# Patient Record
Sex: Male | Born: 1945 | Race: White | Hispanic: No | Marital: Married | Smoking: Never smoker
Health system: Southern US, Community
[De-identification: ages and names within clinical notes are randomized; demographics above are authoritative.]

## PROBLEM LIST (undated history)

## (undated) DIAGNOSIS — J309 Allergic rhinitis, unspecified: Secondary | ICD-10-CM

## (undated) DIAGNOSIS — D72819 Decreased white blood cell count, unspecified: Secondary | ICD-10-CM

## (undated) DIAGNOSIS — C4490 Unspecified malignant neoplasm of skin, unspecified: Secondary | ICD-10-CM

## (undated) DIAGNOSIS — M87051 Idiopathic aseptic necrosis of right femur: Secondary | ICD-10-CM

## (undated) DIAGNOSIS — K4020 Bilateral inguinal hernia, without obstruction or gangrene, not specified as recurrent: Secondary | ICD-10-CM

## (undated) DIAGNOSIS — G4730 Sleep apnea, unspecified: Secondary | ICD-10-CM

## (undated) DIAGNOSIS — E785 Hyperlipidemia, unspecified: Secondary | ICD-10-CM

## (undated) DIAGNOSIS — E538 Deficiency of other specified B group vitamins: Secondary | ICD-10-CM

## (undated) DIAGNOSIS — F1910 Other psychoactive substance abuse, uncomplicated: Secondary | ICD-10-CM

## (undated) DIAGNOSIS — K2270 Barrett's esophagus without dysplasia: Secondary | ICD-10-CM

## (undated) DIAGNOSIS — C801 Malignant (primary) neoplasm, unspecified: Secondary | ICD-10-CM

## (undated) DIAGNOSIS — I2510 Atherosclerotic heart disease of native coronary artery without angina pectoris: Secondary | ICD-10-CM

## (undated) DIAGNOSIS — N281 Cyst of kidney, acquired: Secondary | ICD-10-CM

## (undated) DIAGNOSIS — D649 Anemia, unspecified: Secondary | ICD-10-CM

## (undated) DIAGNOSIS — N400 Enlarged prostate without lower urinary tract symptoms: Secondary | ICD-10-CM

## (undated) DIAGNOSIS — G4733 Obstructive sleep apnea (adult) (pediatric): Secondary | ICD-10-CM

## (undated) DIAGNOSIS — I447 Left bundle-branch block, unspecified: Secondary | ICD-10-CM

## (undated) DIAGNOSIS — M1990 Unspecified osteoarthritis, unspecified site: Secondary | ICD-10-CM

## (undated) DIAGNOSIS — M87052 Idiopathic aseptic necrosis of left femur: Secondary | ICD-10-CM

## (undated) DIAGNOSIS — M51369 Other intervertebral disc degeneration, lumbar region without mention of lumbar back pain or lower extremity pain: Secondary | ICD-10-CM

## (undated) DIAGNOSIS — N411 Chronic prostatitis: Secondary | ICD-10-CM

## (undated) DIAGNOSIS — I10 Essential (primary) hypertension: Secondary | ICD-10-CM

## (undated) DIAGNOSIS — G629 Polyneuropathy, unspecified: Secondary | ICD-10-CM

## (undated) DIAGNOSIS — N529 Male erectile dysfunction, unspecified: Secondary | ICD-10-CM

## (undated) DIAGNOSIS — E559 Vitamin D deficiency, unspecified: Secondary | ICD-10-CM

## (undated) DIAGNOSIS — K219 Gastro-esophageal reflux disease without esophagitis: Secondary | ICD-10-CM

## (undated) DIAGNOSIS — D126 Benign neoplasm of colon, unspecified: Secondary | ICD-10-CM

## (undated) DIAGNOSIS — Z7982 Long term (current) use of aspirin: Secondary | ICD-10-CM

## (undated) DIAGNOSIS — R7303 Prediabetes: Secondary | ICD-10-CM

## (undated) HISTORY — DX: Anemia, unspecified: D64.9

## (undated) HISTORY — PX: HEMORRHOID SURGERY: SHX153

## (undated) HISTORY — PX: OTHER SURGICAL HISTORY: SHX169

## (undated) HISTORY — PX: APPENDECTOMY: SHX54

## (undated) HISTORY — PX: COLONOSCOPY W/ POLYPECTOMY: SHX1380

## (undated) HISTORY — PX: TONSILLECTOMY: SUR1361

## (undated) HISTORY — PX: ESOPHAGOGASTRODUODENOSCOPY: SHX1529

---

## 2005-05-05 ENCOUNTER — Ambulatory Visit: Payer: Self-pay | Admitting: Unknown Physician Specialty

## 2008-08-21 ENCOUNTER — Ambulatory Visit: Payer: Self-pay | Admitting: Unknown Physician Specialty

## 2008-10-23 ENCOUNTER — Ambulatory Visit: Payer: Self-pay | Admitting: Urology

## 2012-03-14 ENCOUNTER — Ambulatory Visit: Payer: Self-pay | Admitting: Unknown Physician Specialty

## 2013-12-05 ENCOUNTER — Ambulatory Visit: Payer: Self-pay | Admitting: Unknown Physician Specialty

## 2013-12-08 LAB — PATHOLOGY REPORT

## 2013-12-15 ENCOUNTER — Ambulatory Visit: Payer: Self-pay | Admitting: Unknown Physician Specialty

## 2014-01-08 DIAGNOSIS — N401 Enlarged prostate with lower urinary tract symptoms: Secondary | ICD-10-CM

## 2014-01-08 DIAGNOSIS — E291 Testicular hypofunction: Secondary | ICD-10-CM | POA: Insufficient documentation

## 2014-01-08 DIAGNOSIS — F528 Other sexual dysfunction not due to a substance or known physiological condition: Secondary | ICD-10-CM | POA: Insufficient documentation

## 2014-01-08 DIAGNOSIS — N368 Other specified disorders of urethra: Secondary | ICD-10-CM | POA: Insufficient documentation

## 2014-01-08 DIAGNOSIS — N529 Male erectile dysfunction, unspecified: Secondary | ICD-10-CM | POA: Insufficient documentation

## 2014-01-08 DIAGNOSIS — N411 Chronic prostatitis: Secondary | ICD-10-CM | POA: Insufficient documentation

## 2014-01-08 DIAGNOSIS — N138 Other obstructive and reflux uropathy: Secondary | ICD-10-CM | POA: Insufficient documentation

## 2014-01-08 DIAGNOSIS — N400 Enlarged prostate without lower urinary tract symptoms: Secondary | ICD-10-CM | POA: Insufficient documentation

## 2014-03-17 ENCOUNTER — Ambulatory Visit: Payer: Self-pay | Admitting: Anesthesiology

## 2014-03-19 DIAGNOSIS — I447 Left bundle-branch block, unspecified: Secondary | ICD-10-CM | POA: Insufficient documentation

## 2014-05-12 ENCOUNTER — Ambulatory Visit: Payer: Self-pay | Admitting: Orthopedic Surgery

## 2014-06-16 DIAGNOSIS — E7800 Pure hypercholesterolemia, unspecified: Secondary | ICD-10-CM | POA: Insufficient documentation

## 2014-06-16 DIAGNOSIS — I10 Essential (primary) hypertension: Secondary | ICD-10-CM | POA: Insufficient documentation

## 2014-10-29 DIAGNOSIS — G629 Polyneuropathy, unspecified: Secondary | ICD-10-CM | POA: Insufficient documentation

## 2014-11-21 NOTE — Op Note (Signed)
PATIENT NAME:  Harry Patterson, Harry Patterson MR#:  [id number] DATE OF BIRTH:  02-11-1946  DATE OF PROCEDURE:  05/12/2014  PREOPERATIVE DIAGNOSIS: Right thumb mucous cyst with dorsal spur distal phalanx.   POSTOPERATIVE DIAGNOSIS:  Right thumb mucous cyst with dorsal spur distal phalanx.   PROCEDURE: Excision of dorsal spur distal phalanx, right thumb.   ANESTHESIA: General.   SURGEON: Hessie Knows, MD.   DESCRIPTION OF PROCEDURE: The patient was brought to the operating room and after adequate anesthesia was obtained the right arm was prepped and draped in the usual sterile fashion with a tourniquet applied to the upper arm. After patient identification and timeout procedures were completed, the tourniquet was raised to 250 mmHg. A curved incision was made over the IP joint of the thumb dorsally to give adequate exposure to the extensor tendon. This was lifted up as a flap and a hole in the tendon was identified consistent with the area of the mucous cyst which had ruptured prior to surgery. This hole was debrided. An incision was made on the radial side of the extensor tendon lifting the tendon up and protecting it. There was a large dorsal spur off the proximal end of the distal phalanx. A tiny osteotome was used and followed by a curette to remove this until it was no longer palpable through the dorsal skin and through the dorsum of the thumb.  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 After adequate resection of the spur based on direct visualization and palpation the wound was thoroughly irrigated and the wound was closed using simple interrupted 4-0 nylon skin sutures. Xeroform, 2 x 2s and a finger roll were applied. 10 mL of 0.5% Sensorcaine without epinephrine were infiltrated proximal to the incision to aid in postoperative analgesia.   ESTIMATED BLOOD LOSS: Minimal.   COMPLICATIONS: None.    SPECIMEN: None.   TOURNIQUET TIME: 15 minutes at 250 mmHg.    ____________________________ Harry Footman, MD mjm:bu D: 05/12/2014 19:14:41 ET T: 05/12/2014 20:29:35 ET JOB#: [id number]  cc: Harry Footman, MD, <Dictator> Harry Footman MD ELECTRONICALLY SIGNED 05/13/2014 0:29

## 2015-01-26 ENCOUNTER — Ambulatory Visit: Payer: BLUE CROSS/BLUE SHIELD | Attending: Otolaryngology

## 2015-01-26 DIAGNOSIS — G4733 Obstructive sleep apnea (adult) (pediatric): Secondary | ICD-10-CM | POA: Diagnosis not present

## 2015-06-14 DIAGNOSIS — Z85828 Personal history of other malignant neoplasm of skin: Secondary | ICD-10-CM | POA: Insufficient documentation

## 2015-10-27 ENCOUNTER — Other Ambulatory Visit: Payer: Self-pay | Admitting: Neurology

## 2015-10-27 DIAGNOSIS — R200 Anesthesia of skin: Secondary | ICD-10-CM

## 2015-10-27 DIAGNOSIS — M545 Low back pain: Secondary | ICD-10-CM

## 2015-10-28 ENCOUNTER — Other Ambulatory Visit: Payer: Self-pay | Admitting: Neurology

## 2015-10-28 DIAGNOSIS — R51 Headache: Principal | ICD-10-CM

## 2015-10-28 DIAGNOSIS — R519 Headache, unspecified: Secondary | ICD-10-CM

## 2015-10-28 DIAGNOSIS — M545 Low back pain: Secondary | ICD-10-CM

## 2015-11-04 ENCOUNTER — Ambulatory Visit
Admission: RE | Admit: 2015-11-04 | Discharge: 2015-11-04 | Disposition: A | Discharge status: Home / Self Care | Payer: BLUE CROSS/BLUE SHIELD | Source: Ambulatory Visit | Attending: Neurology | Admitting: Neurology

## 2015-11-04 DIAGNOSIS — M545 Low back pain: Secondary | ICD-10-CM

## 2015-11-04 DIAGNOSIS — R51 Headache: Principal | ICD-10-CM

## 2015-11-04 DIAGNOSIS — R519 Headache, unspecified: Secondary | ICD-10-CM

## 2016-05-16 ENCOUNTER — Other Ambulatory Visit: Payer: Self-pay | Admitting: Internal Medicine

## 2016-05-16 DIAGNOSIS — M542 Cervicalgia: Secondary | ICD-10-CM

## 2016-05-29 ENCOUNTER — Ambulatory Visit: Payer: BLUE CROSS/BLUE SHIELD

## 2017-02-09 ENCOUNTER — Emergency Department
Admission: EM | Admit: 2017-02-09 | Discharge: 2017-02-09 | Disposition: A | Discharge status: Home / Self Care | Payer: BLUE CROSS/BLUE SHIELD | Attending: Emergency Medicine | Admitting: Emergency Medicine

## 2017-02-09 ENCOUNTER — Emergency Department: Payer: BLUE CROSS/BLUE SHIELD

## 2017-02-09 ENCOUNTER — Encounter: Payer: Self-pay | Admitting: Emergency Medicine

## 2017-02-09 DIAGNOSIS — Y92018 Other place in single-family (private) house as the place of occurrence of the external cause: Secondary | ICD-10-CM | POA: Diagnosis not present

## 2017-02-09 DIAGNOSIS — S299XXA Unspecified injury of thorax, initial encounter: Secondary | ICD-10-CM | POA: Diagnosis present

## 2017-02-09 DIAGNOSIS — W19XXXA Unspecified fall, initial encounter: Secondary | ICD-10-CM

## 2017-02-09 DIAGNOSIS — S2232XA Fracture of one rib, left side, initial encounter for closed fracture: Secondary | ICD-10-CM | POA: Diagnosis not present

## 2017-02-09 DIAGNOSIS — Y93H9 Activity, other involving exterior property and land maintenance, building and construction: Secondary | ICD-10-CM | POA: Diagnosis not present

## 2017-02-09 DIAGNOSIS — W1789XA Other fall from one level to another, initial encounter: Secondary | ICD-10-CM | POA: Insufficient documentation

## 2017-02-09 DIAGNOSIS — Y998 Other external cause status: Secondary | ICD-10-CM | POA: Diagnosis not present

## 2017-02-09 HISTORY — DX: Essential (primary) hypertension: I10

## 2017-02-09 HISTORY — DX: Malignant (primary) neoplasm, unspecified: C80.1

## 2017-02-09 HISTORY — DX: Benign prostatic hyperplasia without lower urinary tract symptoms: N40.0

## 2017-02-09 HISTORY — DX: Allergic rhinitis, unspecified: J30.9

## 2017-02-09 HISTORY — DX: Left bundle-branch block, unspecified: I44.7

## 2017-02-09 HISTORY — DX: Gastro-esophageal reflux disease without esophagitis: K21.9

## 2017-02-09 HISTORY — DX: Decreased white blood cell count, unspecified: D72.819

## 2017-02-09 MED ORDER — TRAMADOL HCL 50 MG PO TABS: 50.0000 mg | ORAL_TABLET | Freq: Four times a day (QID) | ORAL | 0 refills | Status: AC | PRN

## 2017-02-09 MED ORDER — DIAZEPAM 5 MG PO TABS
5.0000 mg | ORAL_TABLET | Freq: Once | ORAL | Status: AC
  Administered 2017-02-09: 5 mg via ORAL
  Filled 2017-02-09: qty 1

## 2017-02-09 MED ORDER — TRAMADOL HCL 50 MG PO TABS
50.0000 mg | ORAL_TABLET | Freq: Once | ORAL | Status: AC
  Administered 2017-02-09: 50 mg via ORAL
  Filled 2017-02-09: qty 1

## 2017-02-09 MED ORDER — DIAZEPAM 5 MG/ML IJ SOLN: 2.5000 mg | Freq: Once | INTRAMUSCULAR | Status: DC

## 2017-02-09 MED ORDER — CYCLOBENZAPRINE HCL 5 MG PO TABS: 5.0000 mg | ORAL_TABLET | Freq: Three times a day (TID) | ORAL | 0 refills | Status: DC | PRN

## 2017-02-09 NOTE — ED Notes (Signed)
Patient declined recheck of vital signs.

## 2017-02-09 NOTE — ED Triage Notes (Signed)
Pt presents with low back pain due to falling off of a four foot deck yesterday landing on a bush. NO LOC at time of fall only c/o low back pain worse when sitting.

## 2017-02-09 NOTE — ED Provider Notes (Signed)
[facility] Emergency Department Provider Note   ____________________________________________   I have reviewed the triage vital signs and the nursing notes.   HISTORY  Chief Complaint Fall and Back Pain    HPI Harry Patterson. is a 71 y.o. male presents emergency Department with left side mid back pain and left rib pain since falling yesterday. Patient described climbing a ladder to help a neighbor change a flood light on their home when he misstepped and fell off the ladder landing on a plastic lion. Patient describes falling on the left side of his back and flank region. Patient reports pain along the left side of his mid back along the back musculature that worsens with mobility, palpation and breathing. Patient reported immediate onset of pain and difficulty with mobility following the fall. Patient denies loss of consciousness, head/neck or spine pain. Patient denies any past history of back injury or surgeries. Patient denies hematuria, radicular symptoms, bowel or bladder dysfunction or saddle anesthesia.Patient denies fever, chills, headache, vision changes, chest pain, chest tightness, shortness of breath, abdominal pain, nausea and vomiting.  Past Medical History:  Diagnosis Date  . Allergic rhinitis   . Cancer ([place])    skin  . Enlarged prostate   . GERD (gastroesophageal reflux disease)   . Hypertension   . LBBB (left bundle branch block)   . Leukopenia     There are no active problems to display for this patient.   History reviewed. No pertinent surgical history.  Prior to Admission medications   Medication Sig Start Date End Date Taking? Authorizing Provider  cyclobenzaprine (FLEXERIL) 5 MG tablet Take 1 tablet (5 mg total) by mouth 3 (three) times daily as needed. 02/09/17   Jacyln Carmer M, PA-C  traMADol (ULTRAM) 50 MG tablet Take 1 tablet (50 mg total) by mouth every 6 (six) hours as needed. 02/09/17 02/09/18  Corwin Kuiken, Laroy Apple,  PA-C    Allergies Patient has no known allergies.  No family history on file.  Social History Social History  Substance Use Topics  . Smoking status: Never Smoker  . Smokeless tobacco: Never Used  . Alcohol use No    Review of Systems Constitutional: Negative for fever/chills Eyes: No visual changes. ENT:  Negative difficulty swallowing Cardiovascular: Denies chest pain. Respiratory: Denies shortness of breath. Gastrointestinal: No abdominal pain.  No nausea, vomiting, diarrhea. Musculoskeletal: Positive for left side mid back pain localized along the rib cage.  Skin: Negative for rash. Neurological: Negative for headaches.  Negative focal weakness or numbness. Negative for loss of consciousness. Able to ambulate. ____________________________________________   PHYSICAL EXAM:  VITAL SIGNS: ED Triage Vitals  Enc Vitals Group     BP 02/09/17 1130 (!) 154/86     Pulse Rate 02/09/17 1130 80     Resp 02/09/17 1130 20     Temp 02/09/17 1130 97.9 F (36.6 C)     Temp Source 02/09/17 1130 Oral     SpO2 02/09/17 1130 96 %     Weight 02/09/17 1131 180 lb (81.6 kg)     Height 02/09/17 1131 5\' 8"  (1.727 m)     Head Circumference --      Peak Flow --      Pain Score 02/09/17 1129 8     Pain Loc --      Pain Edu? --      Excl. in [place]? --     Constitutional: Alert and oriented. Well appearing and in no acute distress.  Head: Normocephalic and atraumatic. Eyes: Conjunctivae are normal. PERRL. Normal extraocular movements Nose: No congestion/rhinorrhea Mouth/Throat: Mucous membranes are moist. Cardiovascular: Normal rate, regular rhythm. Normal distal pulses. Respiratory: Normal respiratory effort. No wheezes/rales/rhonchi. Lungs CTAB Gastrointestinal: Soft and nontender. No distention. Musculoskeletal: Left side mid back pain localized along lower ribs that increases with breathing motions, trunk movement. No crepitus noted. Negative spinous process pain or deformities noted  along the spine. Palpable generalized tenderness along paraspinal musculature. Nontender with normal range of motion in all extremities. Neurologic: Normal speech and language. No gross focal neurologic deficits are appreciated. Skin:  Skin is warm, dry and intact. No rash noted. ____________________________________________   LABS (all labs ordered are listed, but only abnormal results are displayed)  Labs Reviewed - No data to display ____________________________________________  EKG none ____________________________________________  RADIOLOGY DG Ribs Unilateral w/chest left FINDINGS: Normal heart size, mediastinal contours, and pulmonary vascularity.  Mild bronchitic changes.  No acute infiltrate, pleural effusion or pneumothorax.  Minimal atelectasis at RIGHT costophrenic angle.  Osseous mineralization grossly normal.  BB placed at site of symptoms posterior lower LEFT ribs.  Questionable nondisplaced fracture of the posterior LEFT eleventh rib.  No additional fractures identified.  IMPRESSION: Minimal LEFT basilar atelectasis.  Questionable nondisplaced fracture posterior LEFT eleventh rib. ____________________________________________   PROCEDURES  Procedure(s) performed: no    Critical Care performed: no ____________________________________________   INITIAL IMPRESSION / ASSESSMENT AND PLAN / ED COURSE  Pertinent labs & imaging results that were available during my care of the patient were reviewed by me and considered in my medical decision making (see chart for details).  Patient presented with left side mid back pain since falling off of a ladder and landing on his left flank/mid-back across a plastic lion ornament in a yard. Patient physical exam and imaging findings are consistent with left side nondisplaced posterior rib fracture. Patient received Valium 5 mg PO and tramadol 50 mg by mouth during the  course in the Emergency department noting  improvement of symptoms. Patient be given a prescription for tramadol 50 mg and Flexeril 5 mg. Patient is scheduled to see his PCP next week for an annual visit and will follow-up with Dr. Ramonita Lab if further treatment is needed. Patient was advised to return to the emergency department for symptoms that change or worsen. Patient informed of clinical course, understand medical decision-making process, and agree with plan.     ____________________________________________   FINAL CLINICAL IMPRESSION(S) / ED DIAGNOSES  Final diagnoses:  Fall, initial encounter  Closed traumatic nondisplaced fracture of one rib of left side, initial encounter       NEW MEDICATIONS STARTED DURING THIS VISIT:  Discharge Medication List as of 02/09/2017  2:46 PM    START taking these medications   Details  cyclobenzaprine (FLEXERIL) 5 MG tablet Take 1 tablet (5 mg total) by mouth 3 (three) times daily as needed., Starting Fri 02/09/2017, Print    traMADol (ULTRAM) 50 MG tablet Take 1 tablet (50 mg total) by mouth every 6 (six) hours as needed., Starting Fri 02/09/2017, Until Sat 02/09/2018, Print         Note:  This document was prepared using Dragon voice recognition software and may include unintentional dictation errors.    Jerolyn Shin, PA-C 02/09/17 1854    Nance Pear, MD 02/10/17 [phone]

## 2017-02-14 ENCOUNTER — Other Ambulatory Visit: Payer: Self-pay | Admitting: Internal Medicine

## 2017-02-14 DIAGNOSIS — R1900 Intra-abdominal and pelvic swelling, mass and lump, unspecified site: Secondary | ICD-10-CM

## 2017-02-28 ENCOUNTER — Ambulatory Visit
Admission: RE | Admit: 2017-02-28 | Discharge: 2017-02-28 | Disposition: A | Discharge status: Home / Self Care | Payer: BLUE CROSS/BLUE SHIELD | Source: Ambulatory Visit | Attending: Internal Medicine | Admitting: Internal Medicine

## 2017-02-28 DIAGNOSIS — I700 Atherosclerosis of aorta: Secondary | ICD-10-CM | POA: Insufficient documentation

## 2017-02-28 DIAGNOSIS — K4020 Bilateral inguinal hernia, without obstruction or gangrene, not specified as recurrent: Secondary | ICD-10-CM | POA: Insufficient documentation

## 2017-02-28 DIAGNOSIS — I2510 Atherosclerotic heart disease of native coronary artery without angina pectoris: Secondary | ICD-10-CM | POA: Diagnosis not present

## 2017-02-28 DIAGNOSIS — R1900 Intra-abdominal and pelvic swelling, mass and lump, unspecified site: Secondary | ICD-10-CM | POA: Insufficient documentation

## 2017-02-28 DIAGNOSIS — M87851 Other osteonecrosis, right femur: Secondary | ICD-10-CM | POA: Diagnosis not present

## 2017-02-28 DIAGNOSIS — M87852 Other osteonecrosis, left femur: Secondary | ICD-10-CM | POA: Diagnosis not present

## 2017-03-01 DIAGNOSIS — I700 Atherosclerosis of aorta: Secondary | ICD-10-CM | POA: Insufficient documentation

## 2017-03-01 DIAGNOSIS — M87052 Idiopathic aseptic necrosis of left femur: Secondary | ICD-10-CM

## 2017-03-01 DIAGNOSIS — M87051 Idiopathic aseptic necrosis of right femur: Secondary | ICD-10-CM | POA: Insufficient documentation

## 2017-03-23 DIAGNOSIS — D126 Benign neoplasm of colon, unspecified: Secondary | ICD-10-CM | POA: Insufficient documentation

## 2017-03-23 DIAGNOSIS — K2270 Barrett's esophagus without dysplasia: Secondary | ICD-10-CM | POA: Insufficient documentation

## 2017-06-05 ENCOUNTER — Encounter: Payer: Self-pay | Admitting: *Deleted

## 2017-06-06 ENCOUNTER — Ambulatory Visit: Payer: BLUE CROSS/BLUE SHIELD | Admitting: Anesthesiology

## 2017-06-06 ENCOUNTER — Encounter: Payer: Self-pay | Admitting: *Deleted

## 2017-06-06 ENCOUNTER — Ambulatory Visit
Admission: RE | Admit: 2017-06-06 | Discharge: 2017-06-06 | Disposition: A | Discharge status: Home / Self Care | Payer: BLUE CROSS/BLUE SHIELD | Source: Ambulatory Visit | Attending: Unknown Physician Specialty | Admitting: Unknown Physician Specialty

## 2017-06-06 ENCOUNTER — Encounter
Admission: RE | Disposition: A | Discharge status: Home / Self Care | Payer: Self-pay | Source: Ambulatory Visit | Attending: Unknown Physician Specialty

## 2017-06-06 DIAGNOSIS — G4730 Sleep apnea, unspecified: Secondary | ICD-10-CM | POA: Diagnosis not present

## 2017-06-06 DIAGNOSIS — Z79899 Other long term (current) drug therapy: Secondary | ICD-10-CM | POA: Insufficient documentation

## 2017-06-06 DIAGNOSIS — Z9889 Other specified postprocedural states: Secondary | ICD-10-CM | POA: Diagnosis not present

## 2017-06-06 DIAGNOSIS — I10 Essential (primary) hypertension: Secondary | ICD-10-CM | POA: Diagnosis not present

## 2017-06-06 DIAGNOSIS — K640 First degree hemorrhoids: Secondary | ICD-10-CM | POA: Diagnosis not present

## 2017-06-06 DIAGNOSIS — Z7982 Long term (current) use of aspirin: Secondary | ICD-10-CM | POA: Insufficient documentation

## 2017-06-06 DIAGNOSIS — Z86010 Personal history of colonic polyps: Secondary | ICD-10-CM | POA: Diagnosis present

## 2017-06-06 DIAGNOSIS — Z9049 Acquired absence of other specified parts of digestive tract: Secondary | ICD-10-CM | POA: Diagnosis not present

## 2017-06-06 DIAGNOSIS — K2270 Barrett's esophagus without dysplasia: Secondary | ICD-10-CM | POA: Diagnosis present

## 2017-06-06 DIAGNOSIS — K219 Gastro-esophageal reflux disease without esophagitis: Secondary | ICD-10-CM | POA: Diagnosis not present

## 2017-06-06 DIAGNOSIS — K5730 Diverticulosis of large intestine without perforation or abscess without bleeding: Secondary | ICD-10-CM | POA: Insufficient documentation

## 2017-06-06 HISTORY — DX: Cyst of kidney, acquired: N28.1

## 2017-06-06 HISTORY — DX: Idiopathic aseptic necrosis of left femur: M87.051

## 2017-06-06 HISTORY — DX: Unspecified osteoarthritis, unspecified site: M19.90

## 2017-06-06 HISTORY — DX: Deficiency of other specified B group vitamins: E53.8

## 2017-06-06 HISTORY — DX: Sleep apnea, unspecified: G47.30

## 2017-06-06 HISTORY — DX: Other psychoactive substance abuse, uncomplicated: F19.10

## 2017-06-06 HISTORY — PX: COLONOSCOPY WITH PROPOFOL: SHX5780

## 2017-06-06 HISTORY — DX: Barrett's esophagus without dysplasia: K22.70

## 2017-06-06 HISTORY — PX: ESOPHAGOGASTRODUODENOSCOPY (EGD) WITH PROPOFOL: SHX5813

## 2017-06-06 HISTORY — DX: Idiopathic aseptic necrosis of left femur: M87.052

## 2017-06-06 SURGERY — COLONOSCOPY WITH PROPOFOL
Anesthesia: General

## 2017-06-06 MED ORDER — PROPOFOL 500 MG/50ML IV EMUL
INTRAVENOUS | Status: DC | PRN
  Administered 2017-06-06: 50 ug/kg/min via INTRAVENOUS

## 2017-06-06 MED ORDER — MIDAZOLAM HCL 2 MG/2ML IJ SOLN
INTRAMUSCULAR | Status: AC
  Filled 2017-06-06: qty 2

## 2017-06-06 MED ORDER — FENTANYL CITRATE (PF) 100 MCG/2ML IJ SOLN
INTRAMUSCULAR | Status: DC | PRN
  Administered 2017-06-06 (×2): 50 ug via INTRAVENOUS

## 2017-06-06 MED ORDER — SODIUM CHLORIDE 0.9 % IV SOLN: INTRAVENOUS | Status: DC

## 2017-06-06 MED ORDER — GLYCOPYRROLATE 0.2 MG/ML IJ SOLN
INTRAMUSCULAR | Status: DC | PRN
  Administered 2017-06-06: 0.2 mg via INTRAVENOUS

## 2017-06-06 MED ORDER — PROPOFOL 10 MG/ML IV BOLUS
INTRAVENOUS | Status: DC | PRN
  Administered 2017-06-06: 40 mg via INTRAVENOUS
  Administered 2017-06-06: 10 mg via INTRAVENOUS

## 2017-06-06 MED ORDER — SODIUM CHLORIDE 0.9 % IV SOLN
INTRAVENOUS | Status: DC
Start: 2017-06-06 — End: 2017-06-06
  Administered 2017-06-06: 14:00:00 via INTRAVENOUS

## 2017-06-06 MED ORDER — MIDAZOLAM HCL 5 MG/5ML IJ SOLN
INTRAMUSCULAR | Status: DC | PRN
  Administered 2017-06-06: 2 mg via INTRAVENOUS

## 2017-06-06 MED ORDER — LIDOCAINE HCL (PF) 2 % IJ SOLN
INTRAMUSCULAR | Status: AC
  Filled 2017-06-06: qty 10

## 2017-06-06 MED ORDER — FENTANYL CITRATE (PF) 100 MCG/2ML IJ SOLN
INTRAMUSCULAR | Status: AC
  Filled 2017-06-06: qty 2

## 2017-06-06 MED ORDER — GLYCOPYRROLATE 0.2 MG/ML IJ SOLN
INTRAMUSCULAR | Status: AC
  Filled 2017-06-06: qty 1

## 2017-06-06 MED ORDER — LIDOCAINE HCL (PF) 2 % IJ SOLN
INTRAMUSCULAR | Status: DC | PRN
  Administered 2017-06-06: 80 mg

## 2017-06-06 NOTE — H&P (Signed)
Primary Care Physician:  Adin Hector, MD Primary Gastroenterologist:  Dr. Vira Agar  Pre-Procedure History & Physical: HPI:  Harry Patterson. is a 71 y.o. male is here for an endoscopy and colonoscopy.   Past Medical History:  Diagnosis Date  . Allergic rhinitis   . Arthritis   . 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 B12 deficiency   . Barrett's esophagus without dysplasia   . Bilateral renal cysts   . Cancer ([place])    skin  . Enlarged prostate   . GERD (gastroesophageal reflux disease)   . Hypertension   . LBBB (left bundle branch block)   . LBBB (left bundle branch block)   . Leukopenia   . Sleep apnea   . Substance abuse [facility])     Past Surgical History:  Procedure Laterality Date  . APPENDECTOMY    . COLONOSCOPY W/ POLYPECTOMY     02/08/00,11'12'04,05/05/05, 08/21/2008, 12/05/2013, 05/08/2014  . ESOPHAGOGASTRODUODENOSCOPY     x7  . HEMORRHOID SURGERY    . TONSILLECTOMY      Prior to Admission medications   Medication Sig Start Date End Date Taking? Authorizing Provider  acetaminophen (TYLENOL) 325 MG tablet Take 1,300 mg 2 (two) times daily as needed by mouth.   Yes [provider]  amLODipine (NORVASC) 10 MG tablet Take 10 mg daily by mouth.   Yes [provider]  aspirin EC 81 MG tablet Take 81 mg daily by mouth.   Yes [provider]  Cholecalciferol 1000 units capsule Take 1,000 Units daily by mouth.   Yes [provider]  folic acid (FOLVITE) 1 MG tablet Take 1 mg daily by mouth.   Yes [provider]  gabapentin (NEURONTIN) 300 MG capsule Take 300 mg 3 (three) times daily by mouth.   Yes [provider]  hydrochlorothiazide (HYDRODIURIL) 12.5 MG tablet Take 12.5 mg daily by mouth.   Yes [provider]  losartan (COZAAR) 100 MG tablet Take 100 mg daily by mouth.   Yes [provider]  pantoprazole (PROTONIX) 40 MG tablet Take 40 mg daily by mouth.   Yes [provider]  cyclobenzaprine (FLEXERIL) 5 MG tablet Take 1 tablet (5 mg total) by mouth 3 (three) times daily as needed. 02/09/17   Little, Traci M, PA-C  traMADol (ULTRAM) 50 MG tablet Take 1 tablet (50 mg total) by mouth every 6 (six) hours as needed. 02/09/17 02/09/18  Little, Traci M, PA-C    Allergies as of 03/28/2017  . (No Known Allergies)    History reviewed. No pertinent family history.  Social History   Socioeconomic History  . Marital status: Married    Spouse name: Not on file  . Number of children: Not on file  . Years of education: Not on file  . Highest education level: Not on file  Social Needs  . Financial resource strain: Not on file  . Food insecurity - worry: Not on file  . Food insecurity - inability: Not on file  . Transportation needs - medical: Not on file  . Transportation needs - non-medical: Not on file  Occupational History  . Not on file  Tobacco Use  . Smoking status: Never Smoker  . Smokeless tobacco: Never Used  Substance and Sexual Activity  . Alcohol use: No  . Drug use: No  . Sexual activity: Not on file  Other Topics Concern  . Not on file  Social History Narrative  .  Not on file    Review of Systems: See HPI, otherwise negative ROS  Physical Exam: BP 106/67   Pulse 76   Temp (!) 97 F (36.1 C) (Tympanic)   Resp 20   Ht 5\' 8"  (1.727 m)   Wt 86.2 kg (190 lb)   SpO2 97%   BMI 28.89 kg/m  General:   Alert,  pleasant and cooperative in NAD Head:  Normocephalic and atraumatic. Neck:  Supple; no masses or thyromegaly. Lungs:  Clear throughout to auscultation.    Heart:  Regular rate and rhythm. Abdomen:  Soft, nontender and nondistended. Normal bowel sounds, without guarding, and without rebound.   Neurologic:  Alert and  oriented x4;  grossly normal neurologically.  Impression/Plan: [name]. is here for an endoscopy and colonoscopy to be performed for [facility] colon polyps and Barretts esophagus.  Risks,  benefits, limitations, and alternatives regarding  endoscopy and colonoscopy have been reviewed with the patient.  Questions have been answered.  All parties agreeable.   Gaylyn Cheers, MD  06/06/2017, 2:43 PM

## 2017-06-06 NOTE — Op Note (Signed)
[facility] Gastroenterology Patient Name: Harry Patterson Procedure Date: 06/06/2017 2:37 PM MRN: [id number] Account #: [id number] Date of Birth: 1945/08/12 Admit Type: Outpatient Age: 71 Room: [facility] ENDO ROOM 3 Gender: Male Note Status: Finalized Procedure:            Upper GI endoscopy Indications:          Heartburn, Follow-up of Barrett's esophagus Providers:            Manya Silvas, MD Medicines:            Propofol per Anesthesia Complications:        No immediate complications. Procedure:            Pre-Anesthesia Assessment:                       - After reviewing the risks and benefits, the patient                        was deemed in satisfactory condition to undergo the                        procedure.                       After obtaining informed consent, the endoscope was                        passed under direct vision. Throughout the procedure,                        the patient's blood pressure, pulse, and oxygen                        saturations were monitored continuously. The Endoscope                        was introduced through the mouth, and advanced to the                        second part of duodenum. The upper GI endoscopy was                        accomplished without difficulty. The patient tolerated                        the procedure well. Findings:      There were esophageal mucosal changes secondary to established       long-segment Barrett's disease present in the lower third of the       esophagus. The maximum longitudinal extent of these mucosal changes was       5 cm in length. Mucosa was biopsied with a cold forceps for histology. A       total of 2 specimen bottles were sent to pathology.      The entire stomach seen, some degree of coarse erythema.. Small hiatal       hernia      The examined duodenum was normal. Impression:           - Esophageal mucosal changes secondary to established    long-segment Barrett's disease. Biopsied.                       -  Normal stomach.                       - Normal examined duodenum. Recommendation:       - Await pathology results. Stay on medication. Manya Silvas, MD 06/06/2017 3:03:58 PM This report has been signed electronically. Number of Addenda: 0 Note Initiated On: 06/06/2017 2:37 PM      [facility]

## 2017-06-06 NOTE — Transfer of Care (Signed)
Immediate Anesthesia Transfer of Care Note  Patient: Harry Patterson.  Procedure(s) Performed: COLONOSCOPY WITH PROPOFOL (N/A ) ESOPHAGOGASTRODUODENOSCOPY (EGD) WITH PROPOFOL (N/A )  Patient Location: PACU  Anesthesia Type:General  Level of Consciousness: sedated  Airway & Oxygen Therapy: Patient Spontanous Breathing and Patient connected to nasal cannula oxygen  Post-op Assessment: Report given to RN and Post -op Vital signs reviewed and stable  Post vital signs: Reviewed and stable  Last Vitals:  Vitals:   06/06/17 1337  BP: 106/67  Pulse: 76  Resp: 20  Temp: (!) 36.1 C  SpO2: 97%    Last Pain:  Vitals:   06/06/17 1337  TempSrc: Tympanic         Complications: No apparent anesthesia complications

## 2017-06-06 NOTE — Anesthesia Post-op Follow-up Note (Signed)
Anesthesia QCDR form completed.        

## 2017-06-06 NOTE — Anesthesia Preprocedure Evaluation (Signed)
Anesthesia Evaluation  Patient identified by MRN, date of birth, ID band Patient awake    Reviewed: Allergy & Precautions, NPO status , Patient's Chart, lab work & pertinent test results  History of Anesthesia Complications Negative for: history of anesthetic complications  Airway Mallampati: II       Dental   Pulmonary sleep apnea and Continuous Positive Airway Pressure Ventilation , neg COPD,           Cardiovascular hypertension, Pt. on medications (-) Past MI and (-) CHF (-) dysrhythmias (-) Valvular Problems/Murmurs     Neuro/Psych neg Seizures    GI/Hepatic Neg liver ROS, GERD  Medicated and Controlled,  Endo/Other  neg diabetes  Renal/GU Renal disease (cysts)     Musculoskeletal   Abdominal   Peds  Hematology   Anesthesia Other Findings   Reproductive/Obstetrics                             Anesthesia Physical Anesthesia Plan  ASA: II  Anesthesia Plan: General   Post-op Pain Management:    Induction: Intravenous  PONV Risk Score and Plan: 2 and Propofol infusion  Airway Management Planned: Nasal Cannula  Additional Equipment:   Intra-op Plan:   Post-operative Plan:   Informed Consent: I have reviewed the patients History and Physical, chart, labs and discussed the procedure including the risks, benefits and alternatives for the proposed anesthesia with the patient or authorized representative who has indicated his/her understanding and acceptance.     Plan Discussed with:   Anesthesia Plan Comments:         Anesthesia Quick Evaluation

## 2017-06-06 NOTE — Anesthesia Postprocedure Evaluation (Signed)
Anesthesia Post Note  Patient: Harry Patterson.  Procedure(s) Performed: COLONOSCOPY WITH PROPOFOL (N/A ) ESOPHAGOGASTRODUODENOSCOPY (EGD) WITH PROPOFOL (N/A )  Patient location during evaluation: Endoscopy Anesthesia Type: General Level of consciousness: awake and alert and oriented Pain management: pain level controlled Vital Signs Assessment: post-procedure vital signs reviewed and stable Respiratory status: spontaneous breathing, nonlabored ventilation and respiratory function stable Cardiovascular status: blood pressure returned to baseline and stable Postop Assessment: no signs of nausea or vomiting Anesthetic complications: no     Last Vitals:  Vitals:   06/06/17 1546 06/06/17 1557  BP: 116/76 111/82  Pulse: 71 61  Resp: 14 (!) 9  Temp:    SpO2: 97% 98%    Last Pain:  Vitals:   06/06/17 1527  TempSrc: Tympanic                 Aylyn Wenzler

## 2017-06-06 NOTE — Op Note (Signed)
[facility] Gastroenterology Patient Name: Harry Patterson Procedure Date: 06/06/2017 2:38 PM MRN: [id number] Account #: [id number] Date of Birth: Feb 24, 1946 Admit Type: Outpatient Age: 71 Room: [facility] ENDO ROOM 3 Gender: Male Note Status: Finalized Procedure:            Colonoscopy Indications:          High risk colon cancer surveillance: Personal history                        of colonic polyps Providers:            Manya Silvas, MD Medicines:            Propofol per Anesthesia Complications:        No immediate complications. Procedure:            Pre-Anesthesia Assessment:                       - After reviewing the risks and benefits, the patient                        was deemed in satisfactory condition to undergo the                        procedure.                       After obtaining informed consent, the colonoscope was                        passed under direct vision. Throughout the procedure,                        the patient's blood pressure, pulse, and oxygen                        saturations were monitored continuously. The                        Colonoscope was introduced through the anus and                        advanced to the the cecum, identified by appendiceal                        orifice and ileocecal valve. The entire colon was                        examined. Findings:      A few small-mouthed diverticula were found in the sigmoid colon. Also in       ascending colon.      Internal hemorrhoids were found during endoscopy. The hemorrhoids were       small and Grade I (internal hemorrhoids that do not prolapse).      The exam was otherwise without abnormality. Impression:           - Diverticulosis in the sigmoid colon.                       - Internal hemorrhoids.                       -  The examination was otherwise normal.                       - No specimens collected. Recommendation:       - Repeat colonoscopy in 5  years for surveillance. Manya Silvas, MD 06/06/2017 3:26:20 PM This report has been signed electronically. Number of Addenda: 0 Note Initiated On: 06/06/2017 2:38 PM Scope Withdrawal Time: 0 hours 8 minutes 23 seconds  Total Procedure Duration: 0 hours 17 minutes 22 seconds       [facility]

## 2017-06-07 ENCOUNTER — Encounter: Payer: Self-pay | Admitting: Unknown Physician Specialty

## 2017-06-08 LAB — SURGICAL PATHOLOGY

## 2017-06-15 ENCOUNTER — Telehealth: Payer: Self-pay | Admitting: Urology

## 2017-06-15 MED ORDER — TAMSULOSIN HCL 0.4 MG PO CAPS: 0.4000 mg | ORAL_CAPSULE | Freq: Every day | ORAL | 3 refills | Status: DC

## 2017-06-15 NOTE — Telephone Encounter (Signed)
LMOM- medication refilled 

## 2017-06-15 NOTE — Telephone Encounter (Signed)
Patient is asking for a refill on his Flomax, stated that [facility] has sent [place] a refill request already? Can you check on this and call Janie @ [phone] to let her know either way if we have it or do not have it.  Thanks  [employer]

## 2018-04-26 ENCOUNTER — Ambulatory Visit (INDEPENDENT_AMBULATORY_CARE_PROVIDER_SITE_OTHER): Payer: BLUE CROSS/BLUE SHIELD | Admitting: Urology

## 2018-04-26 ENCOUNTER — Encounter: Payer: Self-pay | Admitting: Urology

## 2018-04-26 VITALS — BP 136/67 | HR 69 | Ht 68.0 in | Wt 200.7 lb

## 2018-04-26 DIAGNOSIS — E538 Deficiency of other specified B group vitamins: Secondary | ICD-10-CM | POA: Insufficient documentation

## 2018-04-26 DIAGNOSIS — J309 Allergic rhinitis, unspecified: Secondary | ICD-10-CM | POA: Insufficient documentation

## 2018-04-26 DIAGNOSIS — N411 Chronic prostatitis: Secondary | ICD-10-CM | POA: Diagnosis not present

## 2018-04-26 DIAGNOSIS — D72819 Decreased white blood cell count, unspecified: Secondary | ICD-10-CM | POA: Insufficient documentation

## 2018-04-26 MED ORDER — TAMSULOSIN HCL 0.4 MG PO CAPS: 0.4000 mg | ORAL_CAPSULE | Freq: Every day | ORAL | 6 refills | Status: DC

## 2018-04-26 NOTE — Progress Notes (Signed)
04/26/2018 10:12 AM   Harry Patterson. 15-Feb-1946 [id number]  Referring provider: Adin Hector, MD [place] [facility] [place], [place] [address]  Chief Complaint  Patient presents with  . Prostatitis    HPI: 72 year old male previously seen with a history of chronic nonbacterial prostatitis, ED and history of hypogonadism.  He uses tamsulosin as needed for prostatitis symptoms which has worked well.  He recently ran out of medication.  He has occasional mild suprapubic discomfort.  He saw Dr. Caryl Comes in August 2019 and his PSA was 0.28.  He denies dysuria or gross hematuria.  He denies flank, abdominal, pelvic or scrotal pain.  He has taken sildenafil in the past however states his sexual activity has decreased with aging.   PMH: Past Medical History:  Diagnosis Date  . Allergic rhinitis   . Arthritis   . Avascular necrosis of bones of both hips ([place])   . B12 deficiency   . Barrett's esophagus without dysplasia   . Bilateral renal cysts   . Cancer ([place])    skin  . Enlarged prostate   . GERD (gastroesophageal reflux disease)   . Hypertension   . LBBB (left bundle branch block)   . LBBB (left bundle branch block)   . Leukopenia   . Sleep apnea   . Substance abuse [facility])     Surgical History: Past Surgical History:  Procedure Laterality Date  . APPENDECTOMY    . COLONOSCOPY W/ POLYPECTOMY     02/08/00,11'12'04,05/05/05, 08/21/2008, 12/05/2013, 05/08/2014  . COLONOSCOPY WITH PROPOFOL N/A 06/06/2017   Procedure: COLONOSCOPY WITH PROPOFOL;  Surgeon: Manya Silvas, MD;  Location: [facility] ENDOSCOPY;  Service: Endoscopy;  Laterality: N/A;  . ESOPHAGOGASTRODUODENOSCOPY     x7  . ESOPHAGOGASTRODUODENOSCOPY (EGD) WITH PROPOFOL N/A 06/06/2017   Procedure: ESOPHAGOGASTRODUODENOSCOPY (EGD) WITH PROPOFOL;  Surgeon: Manya Silvas, MD;  Location: [facility] ENDOSCOPY;  Service: Endoscopy;  Laterality: N/A;  . HEMORRHOID SURGERY    . TONSILLECTOMY       Home Medications:  Allergies as of 04/26/2018      Reactions   Other Swelling   Pneumonia vaccine-swollen/painful/fever chills Pneumonia vaccine-swollen/painful/fever chills Pneumonia vaccine-swollen/painful/fever chills Pneumonia vaccine-swollen/painful/fever chills      Medication List        Accurate as of 04/26/18 10:12 AM. Always use your most recent med list.          acetaminophen 325 MG tablet Commonly known as:  TYLENOL Take 1,300 mg 2 (two) times daily as needed by mouth.   Acidophilus/Pectin Caps Take by mouth.   amLODipine 10 MG tablet Commonly known as:  NORVASC Take 10 mg daily by mouth.   aspirin EC 81 MG tablet Take 81 mg daily by mouth.   atorvastatin 20 MG tablet Commonly known as:  LIPITOR Take 20 mg by mouth daily.   celecoxib 200 MG capsule Commonly known as:  CELEBREX TAKE 1 CAPSULE BY MOUTH DAILY AS NEEDED FOR PAIN   Cholecalciferol 1000 units capsule Take 1,000 Units daily by mouth.   clobetasol 0.05 % Gel Commonly known as:  TEMOVATE Apply topically.   cyclobenzaprine 5 MG tablet Commonly known as:  FLEXERIL Take 1 tablet (5 mg total) by mouth 3 (three) times daily as needed.   diphenoxylate-atropine 2.5-0.025 MG tablet Commonly known as:  LOMOTIL Take by mouth.   fluorouracil 5 % cream Commonly known as:  EFUDEX Apply to scalp twice daily for 4 weeks   folic acid 1 MG tablet  Commonly known as:  FOLVITE Take 1 mg daily by mouth.   gabapentin 300 MG capsule Commonly known as:  NEURONTIN Take 300 mg 3 (three) times daily by mouth.   hydrochlorothiazide 12.5 MG tablet Commonly known as:  HYDRODIURIL Take 12.5 mg daily by mouth.   losartan 100 MG tablet Commonly known as:  COZAAR Take 100 mg daily by mouth.   omega-3 acid ethyl esters 1 g capsule Commonly known as:  LOVAZA Take 2 capsules by mouth daily.   pantoprazole 40 MG tablet Commonly known as:  PROTONIX Take 40 mg daily by mouth.   sildenafil 100 MG  tablet Commonly known as:  VIAGRA 1 tab 1 hour prior to intercourse   tamsulosin 0.4 MG Caps capsule Commonly known as:  FLOMAX Take 1 capsule (0.4 mg total) daily by mouth.   triamcinolone ointment 0.1 % Commonly known as:  KENALOG Apply twice a day to affected areas on legs   vitamin B-12 1000 MCG tablet Commonly known as:  CYANOCOBALAMIN Take by mouth.   vitamin C 500 MG tablet Commonly known as:  ASCORBIC ACID Take by mouth.       Allergies:  Allergies  Allergen Reactions  . Other Swelling    Pneumonia vaccine-swollen/painful/fever chills Pneumonia vaccine-swollen/painful/fever chills Pneumonia vaccine-swollen/painful/fever chills Pneumonia vaccine-swollen/painful/fever chills     Family History: Family History  Problem Relation Age of Onset  . Heart disease Mother     Social History:  reports that he has never smoked. He has never used smokeless tobacco. He reports that he does not drink alcohol or use drugs.  ROS: UROLOGY Frequent Urination?: No Hard to postpone urination?: No Burning/pain with urination?: Yes Get up at night to urinate?: No Leakage of urine?: No Urine stream starts and stops?: No Trouble starting stream?: No Do you have to strain to urinate?: No Blood in urine?: No Urinary tract infection?: No Sexually transmitted disease?: No Injury to kidneys or bladder?: No Painful intercourse?: No Weak stream?: No Erection problems?: Yes Penile pain?: No  Gastrointestinal Nausea?: No Vomiting?: No Indigestion/heartburn?: Yes Diarrhea?: No Constipation?: No  Constitutional Fever: No Night sweats?: No Weight loss?: No Fatigue?: No  Skin Skin rash/lesions?: No Itching?: No  Eyes Blurred vision?: No Double vision?: No  Ears/Nose/Throat Sore throat?: No Sinus problems?: No  Hematologic/Lymphatic Swollen glands?: No Easy bruising?: No  Cardiovascular Leg swelling?: No Chest pain?: No  Respiratory Cough?: No Shortness  of breath?: No  Endocrine Excessive thirst?: No  Musculoskeletal Back pain?: No Joint pain?: No  Neurological Headaches?: No Dizziness?: No  Psychologic Depression?: No Anxiety?: No  Physical Exam: BP 136/67 (BP Location: Left Arm, Patient Position: Sitting, Cuff Size: Large)   Pulse 69   Ht 5\' 8"  (1.727 m)   Wt 200 lb 11.2 oz (91 kg)   BMI 30.52 kg/m   Constitutional:  Alert and oriented, No acute distress. HEENT: [place] AT, moist mucus membranes.  Trachea midline, no masses. Cardiovascular: No clubbing, cyanosis, or edema. Respiratory: Normal respiratory effort, no increased work of breathing. Lymph: No cervical or inguinal lymphadenopathy. Skin: No rashes, bruises or suspicious lesions. Neurologic: Grossly intact, no focal deficits, moving all 4 extremities. Psychiatric: Normal mood and affect.   Assessment & Plan:   72 year old male with a history of chronic nonbacterial prostatitis.  His tamsulosin was refilled.  Will schedule annual follow-up and he may switch to as needed if he is asymptomatic.   Abbie Sons, MD  [place] [address] [place], [place] [address] [phone]  

## 2018-07-31 DIAGNOSIS — Z9841 Cataract extraction status, right eye: Secondary | ICD-10-CM

## 2018-07-31 HISTORY — DX: Cataract extraction status, left eye: Z98.41

## 2018-10-03 IMAGING — CR DG RIBS W/ CHEST 3+V*L*
3 series · 3 of 3 positions shown · non-contrast
Comparison: None

CLINICAL DATA: Fell down the stairs today striking LEFT side,
posterior LEFT lower rib pain, history hypertension, asthma

EXAM:
LEFT RIBS AND CHEST - 3+ VIEW

[chest pa]
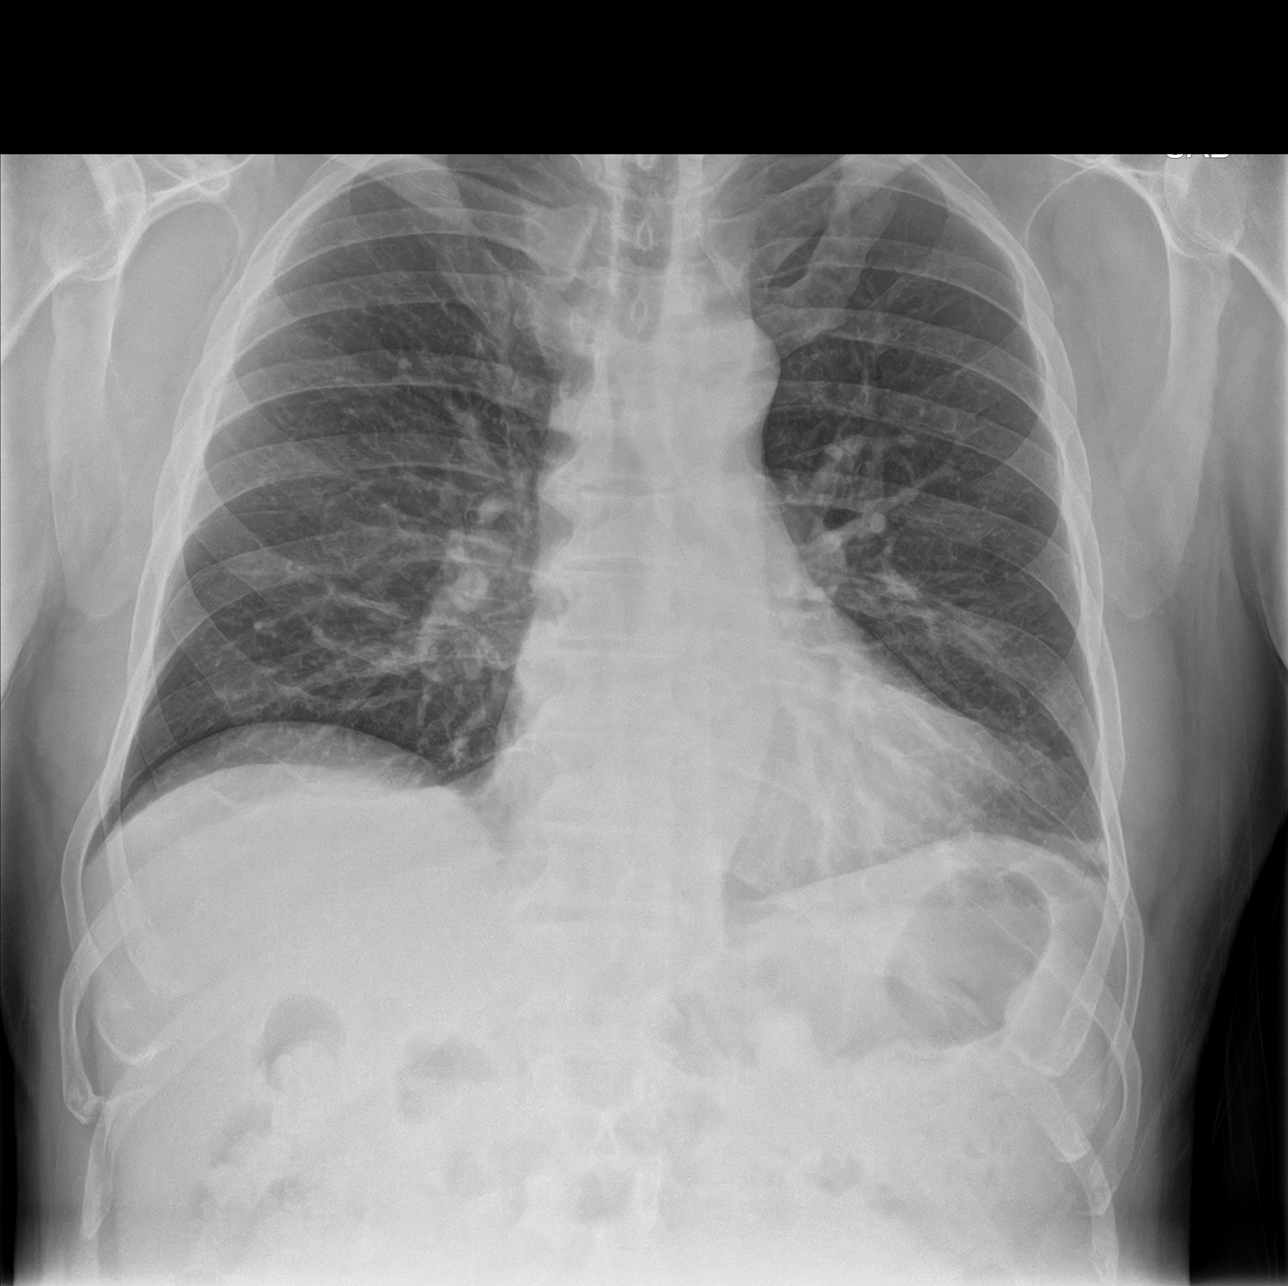

[rib ap]
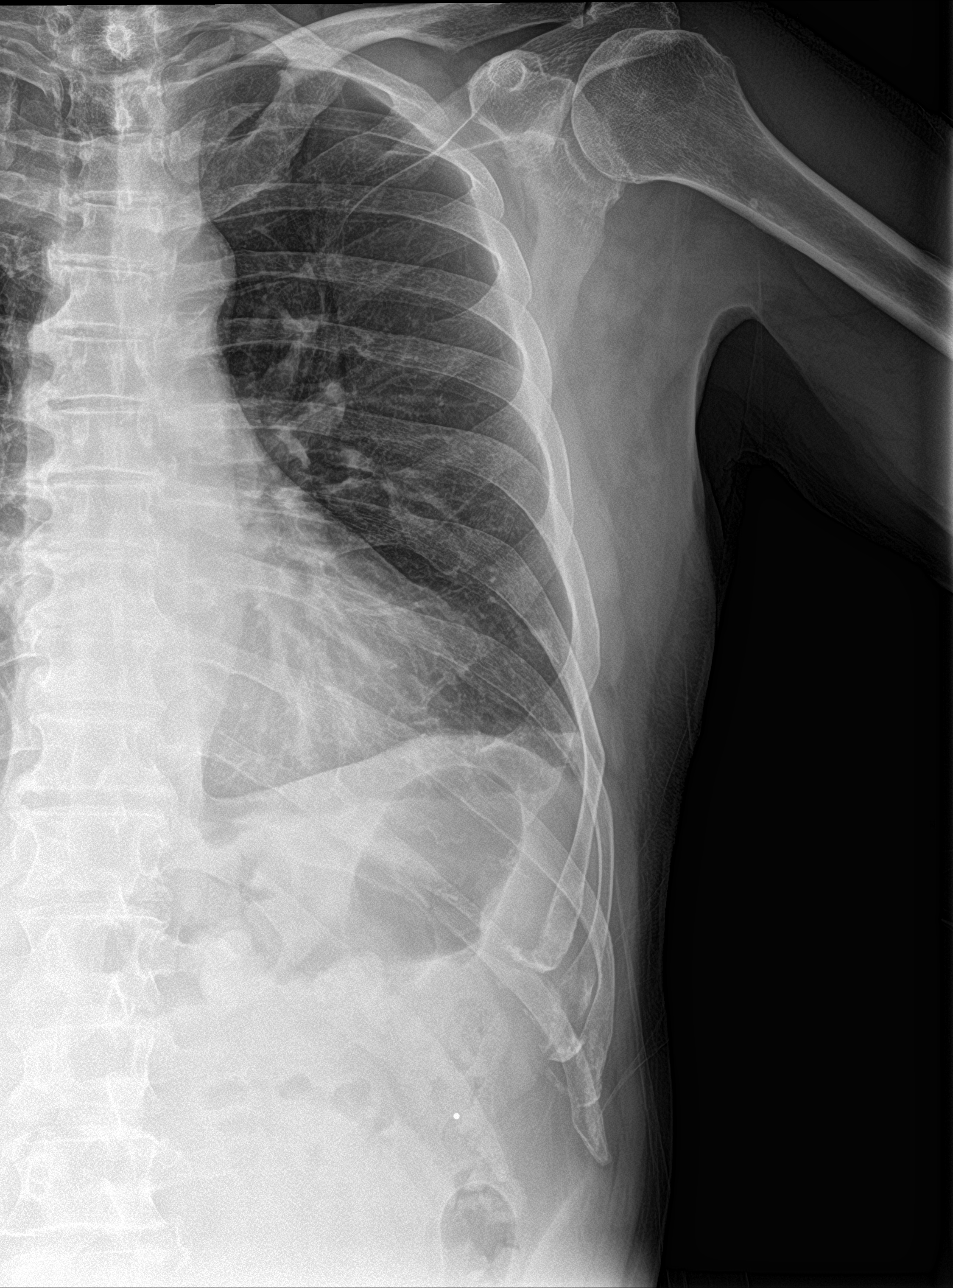

[rib ap obl]
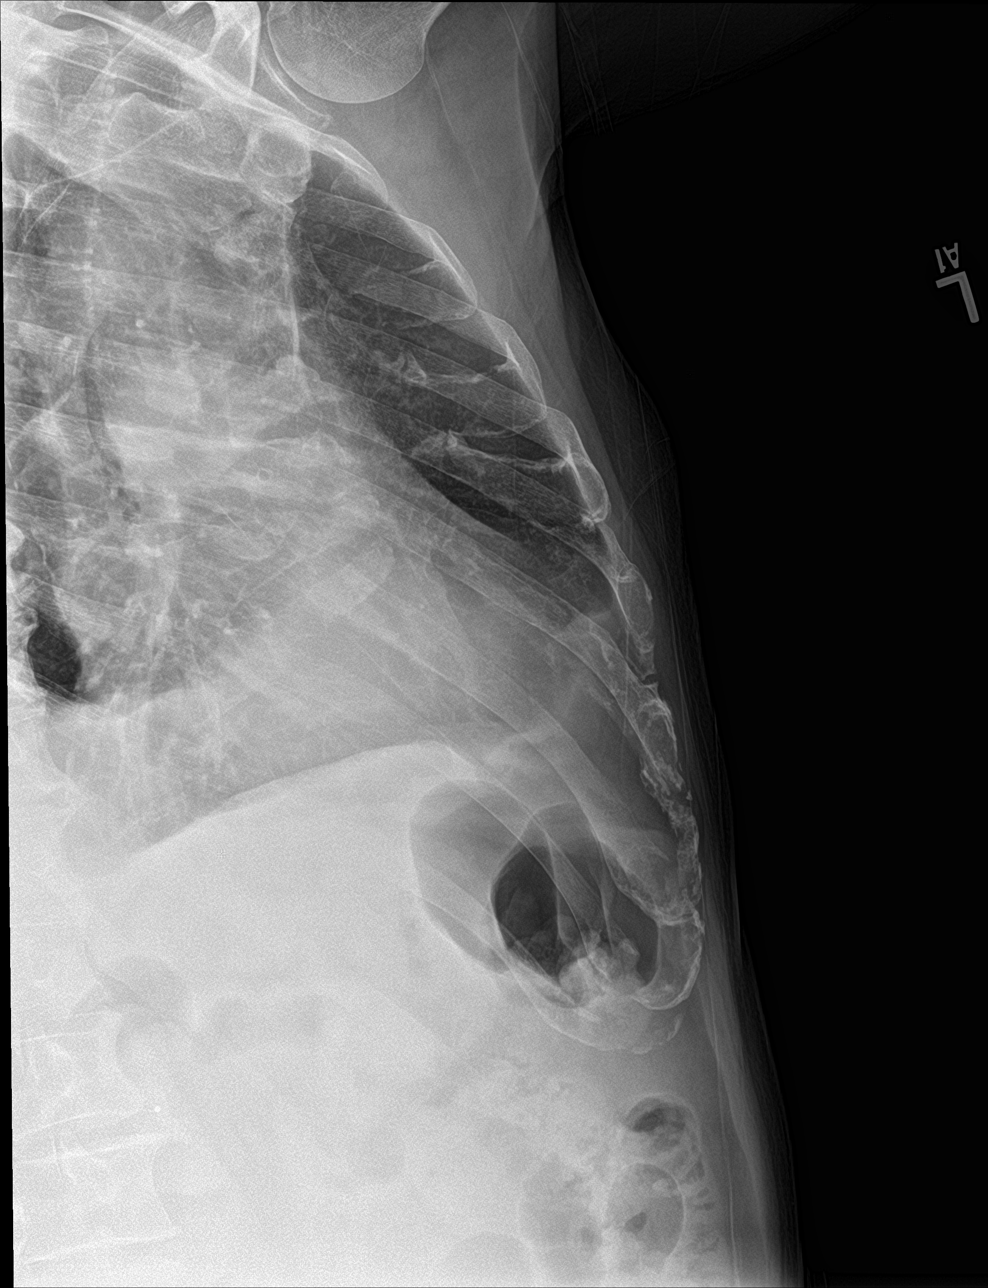

[3 of 3 positions shown; findings below may reference images not displayed]

FINDINGS: Normal heart size, mediastinal contours, and pulmonary vascularity.

Mild bronchitic changes.

No acute infiltrate, pleural effusion or pneumothorax.

Minimal atelectasis at RIGHT costophrenic angle.

Osseous mineralization grossly normal.

BB placed at site of symptoms posterior lower LEFT ribs.

Questionable nondisplaced fracture of the posterior LEFT eleventh
rib.

No additional fractures identified.
IMPRESSION: Minimal LEFT basilar atelectasis.

Questionable nondisplaced fracture posterior LEFT eleventh rib.

## 2019-04-30 ENCOUNTER — Other Ambulatory Visit: Payer: Self-pay

## 2019-04-30 ENCOUNTER — Ambulatory Visit (INDEPENDENT_AMBULATORY_CARE_PROVIDER_SITE_OTHER): Payer: BC Managed Care – PPO | Admitting: Urology

## 2019-04-30 ENCOUNTER — Encounter: Payer: Self-pay | Admitting: Urology

## 2019-04-30 VITALS — BP 153/76 | HR 85 | Ht 68.0 in | Wt 198.9 lb

## 2019-04-30 DIAGNOSIS — N411 Chronic prostatitis: Secondary | ICD-10-CM | POA: Diagnosis not present

## 2019-04-30 NOTE — Progress Notes (Signed)
04/30/2019 11:30 AM   Harry Patterson. Dec 12, 1945 [id number]  Referring provider: Adin Hector, MD [place] [facility] [place],  [place] [address]  Chief Complaint  Patient presents with  . Follow-up    HPI: 73 y.o. male with a long history of chronic nonbacterial prostatitis.  He has used tamsulosin prn for symptom flare for many years.  He was last seen September 2019.  He presently has no bothersome lower urinary tract symptoms.  He has not taken tamsulosin in over 3 months.  He has recently noted perineal and penile discomfort not related to voiding.  He describes the discomfort as a burning sensation.  Denies dysuria or gross hematuria.  He has mild decrease caliber of his urinary stream which is not bothersome.  He has no nocturia.  IPSS was 3/35 with quality-of-life rated 0/6.   PMH: Past Medical History:  Diagnosis Date  . Allergic rhinitis   . Arthritis   . Avascular necrosis of bones of both hips ([place])   . B12 deficiency   . Barrett's esophagus without dysplasia   . Bilateral renal cysts   . Cancer ([place])    skin  . Enlarged prostate   . GERD (gastroesophageal reflux disease)   . Hypertension   . LBBB (left bundle branch block)   . LBBB (left bundle branch block)   . Leukopenia   . Sleep apnea   . Substance abuse [facility])     Surgical History: Past Surgical History:  Procedure Laterality Date  . APPENDECTOMY    . COLONOSCOPY W/ POLYPECTOMY     02/08/00,11'12'04,05/05/05, 08/21/2008, 12/05/2013, 05/08/2014  . COLONOSCOPY WITH PROPOFOL N/A 06/06/2017   Procedure: COLONOSCOPY WITH PROPOFOL;  Surgeon: Manya Silvas, MD;  Location: [facility] ENDOSCOPY;  Service: Endoscopy;  Laterality: N/A;  . ESOPHAGOGASTRODUODENOSCOPY     x7  . ESOPHAGOGASTRODUODENOSCOPY (EGD) WITH PROPOFOL N/A 06/06/2017   Procedure: ESOPHAGOGASTRODUODENOSCOPY (EGD) WITH PROPOFOL;  Surgeon: Manya Silvas, MD;  Location: [facility] ENDOSCOPY;  Service: Endoscopy;   Laterality: N/A;  . HEMORRHOID SURGERY    . TONSILLECTOMY      Home Medications:  Allergies as of 04/30/2019      Reactions   Other Swelling   Pneumonia vaccine-swollen/painful/fever chills Pneumonia vaccine-swollen/painful/fever chills Pneumonia vaccine-swollen/painful/fever chills Pneumonia vaccine-swollen/painful/fever chills      Medication List       Accurate as of April 30, 2019 11:30 AM. If you have any questions, ask your nurse or doctor.        acetaminophen 325 MG tablet Commonly known as: TYLENOL Take 1,300 mg 2 (two) times daily as needed by mouth.   Acidophilus/Pectin Caps Take by mouth.   amLODipine 10 MG tablet Commonly known as: NORVASC Take 10 mg daily by mouth.   aspirin EC 81 MG tablet Take 81 mg daily by mouth.   atorvastatin 20 MG tablet Commonly known as: LIPITOR Take 20 mg by mouth daily.   celecoxib 200 MG capsule Commonly known as: CELEBREX TAKE 1 CAPSULE BY MOUTH DAILY AS NEEDED FOR PAIN   Cholecalciferol 25 MCG (1000 UT) capsule Take 1,000 Units daily by mouth.   clobetasol 0.05 % Gel Commonly known as: TEMOVATE Apply topically.   cyclobenzaprine 5 MG tablet Commonly known as: FLEXERIL Take 1 tablet (5 mg total) by mouth 3 (three) times daily as needed.   diphenoxylate-atropine 2.5-0.025 MG tablet Commonly known as: LOMOTIL Take by mouth.   fluorouracil 5 % cream Commonly known as: EFUDEX Apply to  scalp twice daily for 4 weeks   folic acid 1 MG tablet Commonly known as: FOLVITE Take 1 mg daily by mouth.   gabapentin 300 MG capsule Commonly known as: NEURONTIN Take 300 mg 3 (three) times daily by mouth.   hydrochlorothiazide 12.5 MG tablet Commonly known as: HYDRODIURIL Take 12.5 mg daily by mouth.   losartan 100 MG tablet Commonly known as: COZAAR Take 100 mg daily by mouth.   omega-3 acid ethyl esters 1 g capsule Commonly known as: LOVAZA Take 2 capsules by mouth daily.   pantoprazole 40 MG tablet  Commonly known as: PROTONIX Take 40 mg daily by mouth.   selenium sulfide 2.5 % shampoo Commonly known as: SELSUN Apply topically.   sildenafil 100 MG tablet Commonly known as: VIAGRA 1 tab 1 hour prior to intercourse   tamsulosin 0.4 MG Caps capsule Commonly known as: FLOMAX Take 1 capsule (0.4 mg total) by mouth daily.   triamcinolone ointment 0.1 % Commonly known as: KENALOG Apply twice a day to affected areas on legs   vitamin B-12 1000 MCG tablet Commonly known as: CYANOCOBALAMIN Take by mouth.   vitamin C 500 MG tablet Commonly known as: ASCORBIC ACID Take by mouth.       Allergies:  Allergies  Allergen Reactions  . Other Swelling    Pneumonia vaccine-swollen/painful/fever chills Pneumonia vaccine-swollen/painful/fever chills Pneumonia vaccine-swollen/painful/fever chills Pneumonia vaccine-swollen/painful/fever chills     Family History: Family History  Problem Relation Age of Onset  . Heart disease Mother     Social History:  reports that he has never smoked. He has never used smokeless tobacco. He reports that he does not drink alcohol or use drugs.  ROS: UROLOGY Frequent Urination?: No Hard to postpone urination?: No Burning/pain with urination?: Yes Get up at night to urinate?: No Leakage of urine?: No Urine stream starts and stops?: No Trouble starting stream?: No Do you have to strain to urinate?: No Blood in urine?: No Urinary tract infection?: No Sexually transmitted disease?: No Injury to kidneys or bladder?: No Painful intercourse?: No Weak stream?: No Erection problems?: No Penile pain?: No  Gastrointestinal Nausea?: No Vomiting?: No Indigestion/heartburn?: No Diarrhea?: No Constipation?: No  Constitutional Fever: No Night sweats?: No Weight loss?: No Fatigue?: No  Skin Skin rash/lesions?: No Itching?: No  Eyes Blurred vision?: No Double vision?: No  Ears/Nose/Throat Sore throat?: No Sinus problems?: No   Hematologic/Lymphatic Swollen glands?: No Easy bruising?: No  Cardiovascular Leg swelling?: No Chest pain?: No  Respiratory Cough?: No Shortness of breath?: No  Endocrine Excessive thirst?: No  Musculoskeletal Back pain?: No Joint pain?: No  Neurological Headaches?: No Dizziness?: No  Psychologic Depression?: No Anxiety?: No  Physical Exam: BP (!) 153/76 (BP Location: Left Arm, Patient Position: Sitting, Cuff Size: Normal)   Pulse 85   Ht 5\' 8"  (1.727 m)   Wt 198 lb 14.4 oz (90.2 kg)   BMI 30.24 kg/m   Constitutional:  Alert and oriented, No acute distress. HEENT:  AT, moist mucus membranes.  Trachea midline, no masses. Cardiovascular: No clubbing, cyanosis, or edema. Respiratory: Normal respiratory effort, no increased work of breathing. GI: Abdomen is soft, nontender, nondistended, no abdominal masses GU: Prostate 40 g, moderately tender which reproduces symptoms. Lymph: No cervical or inguinal lymphadenopathy. Skin: No rashes, bruises or suspicious lesions. Neurologic: Grossly intact, no focal deficits, moving all 4 extremities. Psychiatric: Normal mood and affect.   Assessment & Plan:    - Chronic nonbacterial prostatitis He was informed his penile and  perineal pain are most likely secondary to prostatic inflammation.  I recommended he take tamsulosin for 2-4 weeks.  Continue annual follow-up and he was instructed to call as needed for persistent or worsening symptoms.    Abbie Sons, [place] [address], [place], [place] [address] [phone]

## 2019-05-04 ENCOUNTER — Encounter: Payer: Self-pay | Admitting: Urology

## 2019-06-04 ENCOUNTER — Other Ambulatory Visit: Payer: Self-pay

## 2019-06-04 ENCOUNTER — Encounter: Payer: Self-pay | Admitting: *Deleted

## 2019-06-06 ENCOUNTER — Other Ambulatory Visit: Payer: Self-pay

## 2019-06-06 ENCOUNTER — Other Ambulatory Visit
Admission: RE | Admit: 2019-06-06 | Discharge: 2019-06-06 | Disposition: A | Discharge status: Home / Self Care | Payer: BC Managed Care – PPO | Source: Ambulatory Visit | Attending: Ophthalmology | Admitting: Ophthalmology

## 2019-06-06 DIAGNOSIS — Z01812 Encounter for preprocedural laboratory examination: Secondary | ICD-10-CM | POA: Insufficient documentation

## 2019-06-06 DIAGNOSIS — Z20828 Contact with and (suspected) exposure to other viral communicable diseases: Secondary | ICD-10-CM | POA: Diagnosis not present

## 2019-06-06 LAB — SARS CORONAVIRUS 2 (TAT 6-24 HRS): SARS Coronavirus 2: NEGATIVE

## 2019-06-09 NOTE — Discharge Instructions (Signed)
General Anesthesia, Adult, Care After °This sheet gives you information about how to care for yourself after your procedure. Your health care provider may also give you more specific instructions. If you have problems or questions, contact your health care provider. °What can I expect after the procedure? °After the procedure, the following side effects are common: °· Pain or discomfort at the IV site. °· Nausea. °· Vomiting. °· Sore throat. °· Trouble concentrating. °· Feeling cold or chills. °· Weak or tired. °· Sleepiness and fatigue. °· Soreness and body aches. These side effects can affect parts of the body that were not involved in surgery. °Follow these instructions at home: ° °For at least 24 hours after the procedure: °· Have a responsible adult stay with you. It is important to have someone help care for you until you are awake and alert. °· Rest as needed. °· Do not: °? Participate in activities in which you could fall or become injured. °? Drive. °? Use heavy machinery. °? Drink alcohol. °? Take sleeping pills or medicines that cause drowsiness. °? Make important decisions or sign legal documents. °? Take care of children on your own. °Eating and drinking °· Follow any instructions from your health care provider about eating or drinking restrictions. °· When you feel hungry, start by eating small amounts of foods that are soft and easy to digest (bland), such as toast. Gradually return to your regular diet. °· Drink enough fluid to keep your urine pale yellow. °· If you vomit, rehydrate by drinking water, juice, or clear broth. °General instructions °· If you have sleep apnea, surgery and certain medicines can increase your risk for breathing problems. Follow instructions from your health care provider about wearing your sleep device: °? Anytime you are sleeping, including during daytime naps. °? While taking prescription pain medicines, sleeping medicines, or medicines that make you drowsy. °· Return to  your normal activities as told by your health care provider. Ask your health care provider what activities are safe for you. °· Take over-the-counter and prescription medicines only as told by your health care provider. °· If you smoke, do not smoke without supervision. °· Keep all follow-up visits as told by your health care provider. This is important. °Contact a health care provider if: °· You have nausea or vomiting that does not get better with medicine. °· You cannot eat or drink without vomiting. °· You have pain that does not get better with medicine. °· You are unable to pass urine. °· You develop a skin rash. °· You have a fever. °· You have redness around your IV site that gets worse. °Get help right away if: °· You have difficulty breathing. °· You have chest pain. °· You have blood in your urine or stool, or you vomit blood. °Summary °· After the procedure, it is common to have a sore throat or nausea. It is also common to feel tired. °· Have a responsible adult stay with you for the first 24 hours after general anesthesia. It is important to have someone help care for you until you are awake and alert. °· When you feel hungry, start by eating small amounts of foods that are soft and easy to digest (bland), such as toast. Gradually return to your regular diet. °· Drink enough fluid to keep your urine pale yellow. °· Return to your normal activities as told by your health care provider. Ask your health care provider what activities are safe for you. °This information is not   intended to replace advice given to you by your health care provider. Make sure you discuss any questions you have with your health care provider. °Document Released: 10/23/2000 Document Revised: 07/20/2017 Document Reviewed: 03/02/2017 °Elsevier Patient Education © 2020 [employer] Inc[employer] °Cataract Surgery, Care After °This sheet gives you information about how to care for yourself after your procedure. Your health care provider may also  give you more specific instructions. If you have problems or questions, contact your health care provider. °What can I expect after the procedure? °After the procedure, it is common to have: °· Itching. °· Discomfort. °· Fluid discharge. °· Sensitivity to light and to touch. °· Bruising in or around the eye. °· Mild blurred vision. °Follow these instructions at home: °Eye care ° °· Do not touch or rub your eyes. °· Protect your eyes as told by your health care provider. You may be told to wear a protective eye shield or sunglasses. °· Do not put a contact lens into the affected eye or eyes until your health care provider approves. °· Keep the area around your eye clean and dry: °? Avoid swimming. °? Do not allow water to hit you directly in the face while showering. °? Keep soap and shampoo out of your eyes. °· Check your eye every day for signs of infection. Watch for: °? Redness, swelling, or pain. °? Fluid, blood, or pus. °? Warmth. °? A bad smell. °? Vision that is getting worse. °? Sensitivity that is getting worse. °Activity °· Do not drive for 24 hours if you were given a sedative during your procedure. °· Avoid strenuous activities, such as playing contact sports, for as long as told by your health care provider. °· Do not drive or use heavy machinery until your health care provider approves. °· Do not bend or lift heavy objects. Bending increases pressure in the eye. You can walk, climb stairs, and do light household chores. °· Ask your health care provider when you can return to work. If you work in a dusty environment, you may be advised to wear protective eyewear for a period of time. °General instructions °· Take or apply over-the-counter and prescription medicines only as told by your health care provider. This includes eye drops. °· Keep all follow-up visits as told by your health care provider. This is important. °Contact a health care provider if: °· You have increased bruising around your  eye. °· You have pain that is not helped with medicine. °· You have a fever. °· You have redness, swelling, or pain in your eye. °· You have fluid, blood, or pus coming from your incision. °· Your vision gets worse. °· Your sensitivity to light gets worse. °Get help right away if: °· You have sudden loss of vision. °· You see flashes of light or spots (floaters). °· You have severe eye pain. °· You develop nausea or vomiting. °Summary °· After your procedure, it is common to have itching, discomfort, bruising, fluid discharge, or sensitivity to light. °· Follow instructions from your health care provider about caring for your eye after the procedure. °· Do not rub your eye after the procedure. You may need to wear eye protection or sunglasses. Do not wear contact lenses. Keep the area around your eye clean and dry. °· Avoid activities that require a lot of effort. These include playing sports and lifting heavy objects. °· Contact a health care provider if you have increased bruising, pain that does not go away, or a fever. Get   help right away if you suddenly lose your vision, see flashes of light or spots, or have severe pain in the eye. °This information is not intended to replace advice given to you by your health care provider. Make sure you discuss any questions you have with your health care provider. °Document Released: 02/03/2005 Document Revised: 01/14/2018 Document Reviewed: 01/14/2018 °Elsevier Patient Education © 2020 Elsevier Inc. ° °

## 2019-06-11 ENCOUNTER — Ambulatory Visit
Admission: RE | Admit: 2019-06-11 | Discharge: 2019-06-11 | Disposition: A | Discharge status: Home / Self Care | Payer: BC Managed Care – PPO | Source: Ambulatory Visit | Attending: Ophthalmology | Admitting: Ophthalmology

## 2019-06-11 ENCOUNTER — Encounter
Admission: RE | Disposition: A | Discharge status: Home / Self Care | Payer: Self-pay | Source: Ambulatory Visit | Attending: Ophthalmology

## 2019-06-11 ENCOUNTER — Ambulatory Visit: Payer: BC Managed Care – PPO | Admitting: Anesthesiology

## 2019-06-11 ENCOUNTER — Other Ambulatory Visit: Payer: Self-pay

## 2019-06-11 DIAGNOSIS — H2511 Age-related nuclear cataract, right eye: Secondary | ICD-10-CM | POA: Insufficient documentation

## 2019-06-11 DIAGNOSIS — Z85828 Personal history of other malignant neoplasm of skin: Secondary | ICD-10-CM | POA: Insufficient documentation

## 2019-06-11 DIAGNOSIS — I10 Essential (primary) hypertension: Secondary | ICD-10-CM | POA: Diagnosis not present

## 2019-06-11 DIAGNOSIS — E7800 Pure hypercholesterolemia, unspecified: Secondary | ICD-10-CM | POA: Insufficient documentation

## 2019-06-11 DIAGNOSIS — Z79899 Other long term (current) drug therapy: Secondary | ICD-10-CM | POA: Insufficient documentation

## 2019-06-11 DIAGNOSIS — Z7982 Long term (current) use of aspirin: Secondary | ICD-10-CM | POA: Diagnosis not present

## 2019-06-11 DIAGNOSIS — G629 Polyneuropathy, unspecified: Secondary | ICD-10-CM | POA: Insufficient documentation

## 2019-06-11 DIAGNOSIS — K219 Gastro-esophageal reflux disease without esophagitis: Secondary | ICD-10-CM | POA: Diagnosis not present

## 2019-06-11 HISTORY — DX: Polyneuropathy, unspecified: G62.9

## 2019-06-11 HISTORY — PX: CATARACT EXTRACTION W/PHACO: SHX586

## 2019-06-11 SURGERY — PHACOEMULSIFICATION, CATARACT, WITH IOL INSERTION
Anesthesia: Monitor Anesthesia Care | Site: Eye | Laterality: Right

## 2019-06-11 MED ORDER — MIDAZOLAM HCL 2 MG/2ML IJ SOLN
INTRAMUSCULAR | Status: DC | PRN
  Administered 2019-06-11 (×3): 0.5 mg via INTRAVENOUS

## 2019-06-11 MED ORDER — LIDOCAINE HCL (PF) 2 % IJ SOLN
INTRAOCULAR | Status: DC | PRN
  Administered 2019-06-11: 2 mL

## 2019-06-11 MED ORDER — NA HYALUR & NA CHOND-NA HYALUR 0.4-0.35 ML IO KIT
PACK | INTRAOCULAR | Status: DC | PRN
  Administered 2019-06-11: 1 mL via INTRAOCULAR

## 2019-06-11 MED ORDER — CEFUROXIME OPHTHALMIC INJECTION 1 MG/0.1 ML
INJECTION | OPHTHALMIC | Status: DC | PRN
  Administered 2019-06-11: 0.1 mL via INTRACAMERAL

## 2019-06-11 MED ORDER — EPINEPHRINE PF 1 MG/ML IJ SOLN
INTRAOCULAR | Status: DC | PRN
  Administered 2019-06-11: 50 mL via OPHTHALMIC

## 2019-06-11 MED ORDER — ACETAMINOPHEN 325 MG PO TABS: 325.0000 mg | ORAL_TABLET | Freq: Once | ORAL | Status: DC

## 2019-06-11 MED ORDER — BRIMONIDINE TARTRATE-TIMOLOL 0.2-0.5 % OP SOLN
OPHTHALMIC | Status: DC | PRN
  Administered 2019-06-11: 1 [drp] via OPHTHALMIC

## 2019-06-11 MED ORDER — MOXIFLOXACIN HCL 0.5 % OP SOLN
1.0000 [drp] | OPHTHALMIC | Status: DC | PRN
  Administered 2019-06-11 (×3): 1 [drp] via OPHTHALMIC

## 2019-06-11 MED ORDER — FENTANYL CITRATE (PF) 100 MCG/2ML IJ SOLN
INTRAMUSCULAR | Status: DC | PRN
  Administered 2019-06-11 (×3): 25 ug via INTRAVENOUS

## 2019-06-11 MED ORDER — ARMC OPHTHALMIC DILATING DROPS
1.0000 "application " | OPHTHALMIC | Status: DC | PRN
  Administered 2019-06-11 (×3): 1 via OPHTHALMIC

## 2019-06-11 MED ORDER — ACETAMINOPHEN 160 MG/5ML PO SOLN: 325.0000 mg | Freq: Once | ORAL | Status: DC

## 2019-06-11 MED ORDER — TETRACAINE HCL 0.5 % OP SOLN
1.0000 [drp] | OPHTHALMIC | Status: DC | PRN
  Administered 2019-06-11 (×3): 1 [drp] via OPHTHALMIC

## 2019-06-11 SURGICAL SUPPLY — 21 items
CANNULA ANT/CHMB 27G (MISCELLANEOUS) ×1 IMPLANT
CANNULA ANT/CHMB 27GA (MISCELLANEOUS) ×2 IMPLANT
GLOVE SURG LX 7.5 STRW (GLOVE) ×1
GLOVE SURG LX STRL 7.5 STRW (GLOVE) ×1 IMPLANT
GLOVE SURG TRIUMPH 8.0 PF LTX (GLOVE) ×2 IMPLANT
GOWN STRL REUS W/ TWL LRG LVL3 (GOWN DISPOSABLE) ×2 IMPLANT
GOWN STRL REUS W/TWL LRG LVL3 (GOWN DISPOSABLE) ×2
LENS IOL ACRSF IQ TRC 7 20.0 IMPLANT
LENS IOL ACRYSOF IQ TORIC 20.0 ×1 IMPLANT
LENS IOL IQ TORIC 7 20.0 ×1 IMPLANT
MARKER SKIN DUAL TIP RULER LAB (MISCELLANEOUS) ×2 IMPLANT
NDL FILTER BLUNT 18X1 1/2 (NEEDLE) IMPLANT
NEEDLE FILTER BLUNT 18X 1/2SAF (NEEDLE) ×2
NEEDLE FILTER BLUNT 18X1 1/2 (NEEDLE) ×2 IMPLANT
PACK CATARACT BRASINGTON (MISCELLANEOUS) ×2 IMPLANT
PACK EYE AFTER SURG (MISCELLANEOUS) ×2 IMPLANT
PACK OPTHALMIC (MISCELLANEOUS) ×2 IMPLANT
SYR 3ML LL SCALE MARK (SYRINGE) ×3 IMPLANT
SYR TB 1ML LUER SLIP (SYRINGE) ×2 IMPLANT
WATER STERILE IRR 250ML POUR (IV SOLUTION) ×2 IMPLANT
WIPE NON LINTING 3.25X3.25 (MISCELLANEOUS) ×2 IMPLANT

## 2019-06-11 NOTE — Transfer of Care (Signed)
Immediate Anesthesia Transfer of Care Note  Patient: Harry Patterson.  Procedure(s) Performed: CATARACT EXTRACTION PHACO AND INTRAOCULAR LENS PLACEMENT (IOC) RIGHT 00:54.6     22.0%    12.00 (Right Eye)  Patient Location: PACU  Anesthesia Type: MAC  Level of Consciousness: awake, alert  and patient cooperative  Airway and Oxygen Therapy: Patient Spontanous Breathing and Patient connected to supplemental oxygen  Post-op Assessment: Post-op Vital signs reviewed, Patient's Cardiovascular Status Stable, Respiratory Function Stable, Patent Airway and No signs of Nausea or vomiting  Post-op Vital Signs: Reviewed and stable  Complications: No apparent anesthesia complications

## 2019-06-11 NOTE — H&P (Signed)

## 2019-06-11 NOTE — Anesthesia Postprocedure Evaluation (Signed)
Anesthesia Post Note  Patient: Harry Patterson.  Procedure(s) Performed: CATARACT EXTRACTION PHACO AND INTRAOCULAR LENS PLACEMENT (IOC) RIGHT 00:54.6     22.0%    12.00 (Right Eye)     Patient location during evaluation: PACU Anesthesia Type: MAC Level of consciousness: awake and alert and oriented Pain management: satisfactory to patient Vital Signs Assessment: post-procedure vital signs reviewed and stable Respiratory status: spontaneous breathing, nonlabored ventilation and respiratory function stable Cardiovascular status: blood pressure returned to baseline and stable Postop Assessment: Adequate PO intake and No signs of nausea or vomiting Anesthetic complications: no    Raliegh Ip

## 2019-06-11 NOTE — Anesthesia Preprocedure Evaluation (Signed)
Anesthesia Evaluation  Patient identified by MRN, date of birth, ID band Patient awake    Reviewed: Allergy & Precautions, H&P , NPO status , Patient's Chart, lab work & pertinent test results  Airway Mallampati: II  TM Distance: >3 FB Neck ROM: full    Dental no notable dental hx.    Pulmonary sleep apnea ,    Pulmonary exam normal breath sounds clear to auscultation       Cardiovascular hypertension, Normal cardiovascular exam Rhythm:regular Rate:Normal     Neuro/Psych  Neuromuscular disease    GI/Hepatic GERD  ,  Endo/Other    Renal/GU      Musculoskeletal   Abdominal   Peds  Hematology   Anesthesia Other Findings   Reproductive/Obstetrics                             Anesthesia Physical Anesthesia Plan  ASA: III  Anesthesia Plan: MAC   Post-op Pain Management:    Induction:   PONV Risk Score and Plan: 1 and TIVA, Midazolam and Treatment may vary due to age or medical condition  Airway Management Planned:   Additional Equipment:   Intra-op Plan:   Post-operative Plan:   Informed Consent: I have reviewed the patients History and Physical, chart, labs and discussed the procedure including the risks, benefits and alternatives for the proposed anesthesia with the patient or authorized representative who has indicated his/her understanding and acceptance.       Plan Discussed with: CRNA  Anesthesia Plan Comments:         Anesthesia Quick Evaluation

## 2019-06-11 NOTE — Anesthesia Procedure Notes (Signed)
Procedure Name: MAC Performed by: Liviya Santini, CRNA Pre-anesthesia Checklist: Patient identified, Emergency Drugs available, Suction available, Timeout performed and Patient being monitored Patient Re-evaluated:Patient Re-evaluated prior to induction Oxygen Delivery Method: Nasal cannula Placement Confirmation: positive ETCO2       

## 2019-06-11 NOTE — Op Note (Signed)
LOCATION:  [place]   PREOPERATIVE DIAGNOSIS:  Nuclear sclerotic cataract of the right eye.  H25.11   POSTOPERATIVE DIAGNOSIS:  Nuclear sclerotic cataract of the right eye.   PROCEDURE:  Phacoemulsification with Toric posterior chamber intraocular lens placement of the right eye.  Ultrasound time: Procedure(s) with comments: CATARACT EXTRACTION PHACO AND INTRAOCULAR LENS PLACEMENT (IOC) RIGHT 00:54.6     22.0%    12.00 (Right) - sleep apnea  LENS:   Implant Name Type Inv. Item Serial No. Manufacturer Lot No. LRB No. Used Action  LENS ACRYSOF IQ [place] - [id number]  LENS ACRYSOF IQ TORIC [id number]  Right 1 Implanted     [id number] 21.0 D Toric intraocular lens with 2.25 diopters of cylindrical power with axis orientation at 16 degrees.    SURGEON:  Wyonia Hough, MD   ANESTHESIA: Topical with tetracaine drops and 2% Xylocaine jelly, augmented with 1% preservative-free intracameral lidocaine. .   COMPLICATIONS:  None.   DESCRIPTION OF PROCEDURE:  The patient was identified in the holding room and transported to the operating suite and placed in the supine position under the operating microscope.  The right eye was identified as the operative eye, and it was prepped and draped in the usual sterile ophthalmic fashion.    A clear-corneal paracentesis incision was made at the 12:00 position.  0.5 ml of preservative-free 1% lidocaine was injected into the anterior chamber. The anterior chamber was filled with Viscoat.  A 2.4 millimeter near clear corneal incision was then made at the 9:00 position.  A cystotome and capsulorrhexis forceps were then used to make a curvilinear capsulorrhexis.  Hydrodissection and hydrodelineation were then performed using balanced salt solution.   Phacoemulsification was then used in stop and chop fashion to remove the lens, nucleus and epinucleus.  The remaining cortex was aspirated using the irrigation and aspiration handpiece.  Provisc  viscoelastic was then placed into the capsular bag to distend it for lens placement.  The Verion digital marker was used to align the implant at the intended axis.   A Toric lens was then injected into the capsular bag.  It was rotated clockwise until the axis marks on the lens were approximately 15 degrees in the counterclockwise direction to the intended alignment.  The viscoelastic was aspirated from the eye using the irrigation aspiration handpiece.  Then, a Koch spatula through the sideport incision was used to rotate the lens in a clockwise direction until the axis markings of the intraocular lens were lined up with the Verion alignment.  Balanced salt solution was then used to hydrate the wounds. Cefuroxime 0.1 ml of a 10mg /ml solution was injected into the anterior chamber for a dose of 1 mg of intracameral antibiotic at the completion of the case.    The eye was noted to have a physiologic pressure and there was no wound leak noted.   Timolol and Brimonidine drops were applied to the eye.  The patient was taken to the recovery room in stable condition having had no complications of anesthesia or surgery.  Ashley Montminy 06/11/2019, 10:07 AM

## 2019-06-12 ENCOUNTER — Encounter: Payer: Self-pay | Admitting: Ophthalmology

## 2019-06-19 ENCOUNTER — Encounter: Payer: Self-pay | Admitting: *Deleted

## 2019-06-19 ENCOUNTER — Other Ambulatory Visit: Payer: Self-pay

## 2019-06-24 NOTE — Discharge Instructions (Signed)

## 2019-06-27 ENCOUNTER — Other Ambulatory Visit: Payer: Self-pay

## 2019-06-27 ENCOUNTER — Other Ambulatory Visit
Admission: RE | Admit: 2019-06-27 | Discharge: 2019-06-27 | Disposition: A | Discharge status: Home / Self Care | Payer: BC Managed Care – PPO | Source: Ambulatory Visit | Attending: Ophthalmology | Admitting: Ophthalmology

## 2019-06-27 DIAGNOSIS — Z20828 Contact with and (suspected) exposure to other viral communicable diseases: Secondary | ICD-10-CM | POA: Diagnosis not present

## 2019-06-27 DIAGNOSIS — Z01812 Encounter for preprocedural laboratory examination: Secondary | ICD-10-CM | POA: Insufficient documentation

## 2019-06-28 LAB — SARS CORONAVIRUS 2 (TAT 6-24 HRS): SARS Coronavirus 2: NEGATIVE

## 2019-06-30 ENCOUNTER — Ambulatory Visit: Payer: BC Managed Care – PPO | Admitting: Anesthesiology

## 2019-06-30 ENCOUNTER — Other Ambulatory Visit: Payer: Self-pay

## 2019-06-30 ENCOUNTER — Ambulatory Visit
Admission: RE | Admit: 2019-06-30 | Discharge: 2019-06-30 | Disposition: A | Discharge status: Home / Self Care | Payer: BC Managed Care – PPO | Attending: Ophthalmology | Admitting: Ophthalmology

## 2019-06-30 ENCOUNTER — Encounter
Admission: RE | Disposition: A | Discharge status: Home / Self Care | Payer: Self-pay | Source: Home / Self Care | Attending: Ophthalmology

## 2019-06-30 DIAGNOSIS — Z7982 Long term (current) use of aspirin: Secondary | ICD-10-CM | POA: Diagnosis not present

## 2019-06-30 DIAGNOSIS — N400 Enlarged prostate without lower urinary tract symptoms: Secondary | ICD-10-CM | POA: Insufficient documentation

## 2019-06-30 DIAGNOSIS — E7800 Pure hypercholesterolemia, unspecified: Secondary | ICD-10-CM | POA: Insufficient documentation

## 2019-06-30 DIAGNOSIS — G629 Polyneuropathy, unspecified: Secondary | ICD-10-CM | POA: Diagnosis not present

## 2019-06-30 DIAGNOSIS — Z79899 Other long term (current) drug therapy: Secondary | ICD-10-CM | POA: Insufficient documentation

## 2019-06-30 DIAGNOSIS — E538 Deficiency of other specified B group vitamins: Secondary | ICD-10-CM | POA: Insufficient documentation

## 2019-06-30 DIAGNOSIS — K219 Gastro-esophageal reflux disease without esophagitis: Secondary | ICD-10-CM | POA: Diagnosis not present

## 2019-06-30 DIAGNOSIS — G4730 Sleep apnea, unspecified: Secondary | ICD-10-CM | POA: Insufficient documentation

## 2019-06-30 DIAGNOSIS — I10 Essential (primary) hypertension: Secondary | ICD-10-CM | POA: Diagnosis not present

## 2019-06-30 DIAGNOSIS — Z85828 Personal history of other malignant neoplasm of skin: Secondary | ICD-10-CM | POA: Diagnosis not present

## 2019-06-30 DIAGNOSIS — H2512 Age-related nuclear cataract, left eye: Secondary | ICD-10-CM | POA: Diagnosis present

## 2019-06-30 HISTORY — PX: CATARACT EXTRACTION W/PHACO: SHX586

## 2019-06-30 SURGERY — PHACOEMULSIFICATION, CATARACT, WITH IOL INSERTION
Anesthesia: Monitor Anesthesia Care | Site: Eye | Laterality: Left

## 2019-06-30 MED ORDER — LACTATED RINGERS IV SOLN: INTRAVENOUS | Status: DC

## 2019-06-30 MED ORDER — ACETAMINOPHEN 325 MG PO TABS: 325.0000 mg | ORAL_TABLET | Freq: Once | ORAL | Status: DC

## 2019-06-30 MED ORDER — LIDOCAINE HCL (PF) 2 % IJ SOLN
INTRAOCULAR | Status: DC | PRN
  Administered 2019-06-30: 1 mL

## 2019-06-30 MED ORDER — ARMC OPHTHALMIC DILATING DROPS
1.0000 "application " | OPHTHALMIC | Status: DC | PRN
  Administered 2019-06-30 (×3): 1 via OPHTHALMIC

## 2019-06-30 MED ORDER — MIDAZOLAM HCL 2 MG/2ML IJ SOLN
INTRAMUSCULAR | Status: DC | PRN
  Administered 2019-06-30: 2 mg via INTRAVENOUS

## 2019-06-30 MED ORDER — TETRACAINE HCL 0.5 % OP SOLN
1.0000 [drp] | OPHTHALMIC | Status: DC | PRN
  Administered 2019-06-30 (×3): 1 [drp] via OPHTHALMIC

## 2019-06-30 MED ORDER — MOXIFLOXACIN HCL 0.5 % OP SOLN
OPHTHALMIC | Status: DC | PRN
  Administered 2019-06-30: 0.2 mL via OPHTHALMIC

## 2019-06-30 MED ORDER — FENTANYL CITRATE (PF) 100 MCG/2ML IJ SOLN
INTRAMUSCULAR | Status: DC | PRN
  Administered 2019-06-30 (×2): 50 ug via INTRAVENOUS

## 2019-06-30 MED ORDER — MOXIFLOXACIN HCL 0.5 % OP SOLN
1.0000 [drp] | OPHTHALMIC | Status: DC | PRN
  Administered 2019-06-30 (×3): 1 [drp] via OPHTHALMIC

## 2019-06-30 MED ORDER — EPINEPHRINE PF 1 MG/ML IJ SOLN
INTRAOCULAR | Status: DC | PRN
  Administered 2019-06-30: 76 mL via OPHTHALMIC

## 2019-06-30 MED ORDER — BRIMONIDINE TARTRATE-TIMOLOL 0.2-0.5 % OP SOLN
OPHTHALMIC | Status: DC | PRN
  Administered 2019-06-30: 1 [drp] via OPHTHALMIC

## 2019-06-30 MED ORDER — NA HYALUR & NA CHOND-NA HYALUR 0.4-0.35 ML IO KIT
PACK | INTRAOCULAR | Status: DC | PRN
  Administered 2019-06-30: 1 mL via INTRAOCULAR

## 2019-06-30 MED ORDER — ACETAMINOPHEN 160 MG/5ML PO SOLN: 325.0000 mg | Freq: Once | ORAL | Status: DC

## 2019-06-30 SURGICAL SUPPLY — 18 items
CANNULA ANT/CHMB 27G (MISCELLANEOUS) ×1 IMPLANT
CANNULA ANT/CHMB 27GA (MISCELLANEOUS) ×2 IMPLANT
GLOVE SURG LX 7.5 STRW (GLOVE) ×1
GLOVE SURG LX STRL 7.5 STRW (GLOVE) ×1 IMPLANT
GLOVE SURG TRIUMPH 8.0 PF LTX (GLOVE) ×2 IMPLANT
GOWN STRL REUS W/ TWL LRG LVL3 (GOWN DISPOSABLE) ×2 IMPLANT
GOWN STRL REUS W/TWL LRG LVL3 (GOWN DISPOSABLE) ×2
LENS IOL ACRSF IQ TRC 3 21.0 IMPLANT
LENS IOL ACRYSOF IQ TORIC 21.0 ×1 IMPLANT
LENS IOL IQ TORIC 3 21.0 ×1 IMPLANT
MARKER SKIN DUAL TIP RULER LAB (MISCELLANEOUS) ×2 IMPLANT
PACK CATARACT BRASINGTON (MISCELLANEOUS) ×2 IMPLANT
PACK EYE AFTER SURG (MISCELLANEOUS) ×2 IMPLANT
PACK OPTHALMIC (MISCELLANEOUS) ×2 IMPLANT
SYR 3ML LL SCALE MARK (SYRINGE) ×2 IMPLANT
SYR TB 1ML LUER SLIP (SYRINGE) ×2 IMPLANT
WATER STERILE IRR 500ML POUR (IV SOLUTION) ×2 IMPLANT
WIPE NON LINTING 3.25X3.25 (MISCELLANEOUS) ×2 IMPLANT

## 2019-06-30 NOTE — Transfer of Care (Signed)
Immediate Anesthesia Transfer of Care Note  Patient: Harry Patterson.  Procedure(s) Performed: CATARACT EXTRACTION PHACO AND INTRAOCULAR LENS PLACEMENT (IOC) LEFT TORIC LENS 9.97  01:09.1  14.4% (Left Eye)  Patient Location: PACU  Anesthesia Type: MAC  Level of Consciousness: awake, alert  and patient cooperative  Airway and Oxygen Therapy: Patient Spontanous Breathing and Patient connected to supplemental oxygen  Post-op Assessment: Post-op Vital signs reviewed, Patient's Cardiovascular Status Stable, Respiratory Function Stable, Patent Airway and No signs of Nausea or vomiting  Post-op Vital Signs: Reviewed and stable  Complications: No apparent anesthesia complications

## 2019-06-30 NOTE — Op Note (Signed)
LOCATION:  [place]   PREOPERATIVE DIAGNOSIS:  Nuclear sclerotic cataract of the left eye.  H25.12  POSTOPERATIVE DIAGNOSIS:  Nuclear sclerotic cataract of the left eye.   PROCEDURE:  Phacoemulsification with Toric posterior chamber intraocular lens placement of the left eye.  Ultrasound time: Procedure(s): CATARACT EXTRACTION PHACO AND INTRAOCULAR LENS PLACEMENT (IOC) LEFT TORIC LENS 9.97  01:09.1  14.4% (Left) LENS:[id number] 21.0 DToric intraocular lens with 1.5 diopters of cylindrical power with axis orientation at 180 degrees.     SURGEON:  Wyonia Hough, MD   ANESTHESIA:  Topical with tetracaine drops and 2% Xylocaine jelly, augmented with 1% preservative-free intracameral lidocaine.  COMPLICATIONS:  None.   DESCRIPTION OF PROCEDURE:  The patient was identified in the holding room and transported to the operating suite and placed in the supine position under the operating microscope.  The left eye was identified as the operative eye, and it was prepped and draped in the usual sterile ophthalmic fashion.    A clear-corneal paracentesis incision was made at the 1:30 position.  0.5 ml of preservative-free 1% lidocaine was injected into the anterior chamber. The anterior chamber was filled with Viscoat.  A 2.4 millimeter near clear corneal incision was then made at the 10:30 position.  A cystotome and capsulorrhexis forceps were then used to make a curvilinear capsulorrhexis.  Hydrodissection and hydrodelineation were then performed using balanced salt solution.   Phacoemulsification was then used in stop and chop fashion to remove the lens, nucleus and epinucleus.  The remaining cortex was aspirated using the irrigation and aspiration handpiece.  Provisc viscoelastic was then placed into the capsular bag to distend it for lens placement.  The Verion digital marker was used to align the implant at the intended axis.   A 21.0 diopter lens was then injected into the  capsular bag.  It was rotated clockwise until the axis marks on the lens were approximately 15 degrees in the counterclockwise direction to the intended alignment.  The viscoelastic was aspirated from the eye using the irrigation aspiration handpiece.  Then, a Koch spatula through the sideport incision was used to rotate the lens in a clockwise direction until the axis markings of the intraocular lens were lined up with the Verion alignment.  Balanced salt solution was then used to hydrate the wounds. Vigamox 0.2 ml of a 1mg  per ml solution was injected into the anterior chamber for a dose of 0.2 mg of intracameral antibiotic at the completion of the case.    The eye was noted to have a physiologic pressure and there was no wound leak noted.   Timolol and Brimonidine drops were applied to the eye.  The patient was taken to the recovery room in stable condition having had no complications of anesthesia or surgery.  Judeen Geralds 06/30/2019, 8:09 AM

## 2019-06-30 NOTE — Anesthesia Postprocedure Evaluation (Signed)
Anesthesia Post Note  Patient: Harry Patterson.  Procedure(s) Performed: CATARACT EXTRACTION PHACO AND INTRAOCULAR LENS PLACEMENT (IOC) LEFT TORIC LENS 9.97  01:09.1  14.4% (Left Eye)     Patient location during evaluation: PACU Anesthesia Type: MAC Level of consciousness: awake and alert and oriented Pain management: satisfactory to patient Vital Signs Assessment: post-procedure vital signs reviewed and stable Respiratory status: spontaneous breathing, nonlabored ventilation and respiratory function stable Cardiovascular status: blood pressure returned to baseline and stable Postop Assessment: Adequate PO intake and No signs of nausea or vomiting Anesthetic complications: no    Raliegh Ip

## 2019-06-30 NOTE — H&P (Signed)

## 2019-06-30 NOTE — Anesthesia Preprocedure Evaluation (Signed)
Anesthesia Evaluation  Patient identified by MRN, date of birth, ID band Patient awake    Reviewed: Allergy & Precautions, H&P , NPO status , Patient's Chart, lab work & pertinent test results  Airway Mallampati: II  TM Distance: >3 FB Neck ROM: full    Dental no notable dental hx.    Pulmonary sleep apnea ,    Pulmonary exam normal breath sounds clear to auscultation       Cardiovascular hypertension, Normal cardiovascular exam Rhythm:regular Rate:Normal     Neuro/Psych  Neuromuscular disease    GI/Hepatic GERD  ,  Endo/Other    Renal/GU      Musculoskeletal   Abdominal   Peds  Hematology   Anesthesia Other Findings   Reproductive/Obstetrics                             Anesthesia Physical Anesthesia Plan  ASA: III  Anesthesia Plan: MAC   Post-op Pain Management:    Induction:   PONV Risk Score and Plan: 1 and TIVA, Midazolam and Treatment may vary due to age or medical condition  Airway Management Planned:   Additional Equipment:   Intra-op Plan:   Post-operative Plan:   Informed Consent: I have reviewed the patients History and Physical, chart, labs and discussed the procedure including the risks, benefits and alternatives for the proposed anesthesia with the patient or authorized representative who has indicated his/her understanding and acceptance.       Plan Discussed with: CRNA  Anesthesia Plan Comments:         Anesthesia Quick Evaluation  

## 2019-06-30 NOTE — Anesthesia Procedure Notes (Signed)
Procedure Name: MAC Performed by: Tyese Finken M, CRNA Pre-anesthesia Checklist: Timeout performed, Patient being monitored, Suction available, Emergency Drugs available and Patient identified Patient Re-evaluated:Patient Re-evaluated prior to induction Oxygen Delivery Method: Nasal cannula       

## 2019-08-20 ENCOUNTER — Ambulatory Visit: Payer: Self-pay | Attending: Internal Medicine

## 2019-08-20 DIAGNOSIS — Z23 Encounter for immunization: Secondary | ICD-10-CM | POA: Insufficient documentation

## 2019-08-20 NOTE — Progress Notes (Signed)
   [id number]  Name:  Harry Patterson.    MRN: [id number] DOB: 03/25/1946  08/20/2019  Mr. Harry Patterson was observed post Covid-19 immunization for 15 minutes without incidence. He was provided with Vaccine Information Sheet and instruction to access the V-Safe system.   Mr. Harry Patterson was instructed to call 911 with any severe reactions post vaccine: Marland Kitchen Difficulty breathing  . Swelling of your face and throat  . A fast heartbeat  . A bad rash all over your body  . Dizziness and weakness    Immunizations Administered    Name Date Dose VIS Date Route   Pfizer COVID-19 Vaccine 08/20/2019  9:37 AM 0.3 mL 07/11/2019 Intramuscular   Manufacturer: [place]   Lot: [id number]   [place]: [id number]

## 2019-09-10 ENCOUNTER — Ambulatory Visit: Payer: Self-pay | Attending: Internal Medicine

## 2019-09-10 DIAGNOSIS — Z23 Encounter for immunization: Secondary | ICD-10-CM | POA: Insufficient documentation

## 2019-09-10 NOTE — Progress Notes (Signed)
   [id number]  Name:  Harry Patterson.    MRN: [id number] DOB: 10/13/1945  09/10/2019  Mr. Cost was observed post Covid-19 immunization for 15 minutes without incidence. He was provided with Vaccine Information Sheet and instruction to access the V-Safe system.   [name] was instructed to call 911 with any severe reactions post vaccine: Marland Kitchen Difficulty breathing  . Swelling of your face and throat  . A fast heartbeat  . A bad rash all over your body  . Dizziness and weakness    Immunizations Administered    Name Date Dose VIS Date Route   Pfizer COVID-19 Vaccine 09/10/2019  8:26 AM 0.3 mL 07/11/2019 Intramuscular   Manufacturer: [place]   Lot: [id number]   [place]: [id number]

## 2019-11-12 ENCOUNTER — Other Ambulatory Visit: Payer: Self-pay | Admitting: Otolaryngology

## 2019-11-14 LAB — SURGICAL PATHOLOGY

## 2019-11-25 ENCOUNTER — Other Ambulatory Visit
Admission: RE | Admit: 2019-11-25 | Discharge: 2019-11-25 | Disposition: A | Discharge status: Home / Self Care | Payer: BC Managed Care – PPO | Source: Ambulatory Visit | Attending: Internal Medicine | Admitting: Internal Medicine

## 2019-11-25 ENCOUNTER — Other Ambulatory Visit: Payer: Self-pay

## 2019-11-25 DIAGNOSIS — Z20822 Contact with and (suspected) exposure to covid-19: Secondary | ICD-10-CM | POA: Insufficient documentation

## 2019-11-25 DIAGNOSIS — Z01812 Encounter for preprocedural laboratory examination: Secondary | ICD-10-CM | POA: Diagnosis present

## 2019-11-25 LAB — SARS CORONAVIRUS 2 (TAT 6-24 HRS): SARS Coronavirus 2: NEGATIVE

## 2019-11-27 ENCOUNTER — Other Ambulatory Visit: Payer: Self-pay

## 2019-11-27 ENCOUNTER — Ambulatory Visit: Payer: BC Managed Care – PPO | Admitting: Anesthesiology

## 2019-11-27 ENCOUNTER — Encounter
Admission: RE | Disposition: A | Discharge status: Home / Self Care | Payer: Self-pay | Source: Ambulatory Visit | Attending: Internal Medicine

## 2019-11-27 ENCOUNTER — Encounter: Payer: Self-pay | Admitting: Internal Medicine

## 2019-11-27 ENCOUNTER — Ambulatory Visit
Admission: RE | Admit: 2019-11-27 | Discharge: 2019-11-27 | Disposition: A | Discharge status: Home / Self Care | Payer: BC Managed Care – PPO | Source: Ambulatory Visit | Attending: Internal Medicine | Admitting: Internal Medicine

## 2019-11-27 DIAGNOSIS — Z79899 Other long term (current) drug therapy: Secondary | ICD-10-CM | POA: Diagnosis not present

## 2019-11-27 DIAGNOSIS — G629 Polyneuropathy, unspecified: Secondary | ICD-10-CM | POA: Diagnosis not present

## 2019-11-27 DIAGNOSIS — Z7982 Long term (current) use of aspirin: Secondary | ICD-10-CM | POA: Diagnosis not present

## 2019-11-27 DIAGNOSIS — G4730 Sleep apnea, unspecified: Secondary | ICD-10-CM | POA: Diagnosis not present

## 2019-11-27 DIAGNOSIS — M1990 Unspecified osteoarthritis, unspecified site: Secondary | ICD-10-CM | POA: Insufficient documentation

## 2019-11-27 DIAGNOSIS — N400 Enlarged prostate without lower urinary tract symptoms: Secondary | ICD-10-CM | POA: Diagnosis not present

## 2019-11-27 DIAGNOSIS — K2270 Barrett's esophagus without dysplasia: Secondary | ICD-10-CM | POA: Diagnosis not present

## 2019-11-27 DIAGNOSIS — I10 Essential (primary) hypertension: Secondary | ICD-10-CM | POA: Diagnosis not present

## 2019-11-27 DIAGNOSIS — E538 Deficiency of other specified B group vitamins: Secondary | ICD-10-CM | POA: Diagnosis not present

## 2019-11-27 DIAGNOSIS — K219 Gastro-esophageal reflux disease without esophagitis: Secondary | ICD-10-CM | POA: Diagnosis not present

## 2019-11-27 DIAGNOSIS — Z1289 Encounter for screening for malignant neoplasm of other sites: Secondary | ICD-10-CM | POA: Insufficient documentation

## 2019-11-27 DIAGNOSIS — Z8719 Personal history of other diseases of the digestive system: Secondary | ICD-10-CM | POA: Insufficient documentation

## 2019-11-27 HISTORY — PX: ESOPHAGOGASTRODUODENOSCOPY (EGD) WITH PROPOFOL: SHX5813

## 2019-11-27 SURGERY — ESOPHAGOGASTRODUODENOSCOPY (EGD) WITH PROPOFOL
Anesthesia: General

## 2019-11-27 MED ORDER — PROPOFOL 10 MG/ML IV BOLUS
INTRAVENOUS | Status: DC | PRN
  Administered 2019-11-27: 10 mg via INTRAVENOUS
  Administered 2019-11-27: 70 mg via INTRAVENOUS
  Administered 2019-11-27 (×2): 20 mg via INTRAVENOUS

## 2019-11-27 MED ORDER — SODIUM CHLORIDE 0.9 % IV SOLN
INTRAVENOUS | Status: DC
  Administered 2019-11-27: 1000 mL via INTRAVENOUS

## 2019-11-27 MED ORDER — GLYCOPYRROLATE 0.2 MG/ML IJ SOLN
INTRAMUSCULAR | Status: DC | PRN
  Administered 2019-11-27: .2 mg via INTRAVENOUS

## 2019-11-27 MED ORDER — LIDOCAINE HCL (CARDIAC) PF 100 MG/5ML IV SOSY
PREFILLED_SYRINGE | INTRAVENOUS | Status: DC | PRN
  Administered 2019-11-27: 60 mg via INTRAVENOUS

## 2019-11-27 NOTE — Transfer of Care (Signed)
Immediate Anesthesia Transfer of Care Note  Patient: Harry Patterson.  Procedure(s) Performed: ESOPHAGOGASTRODUODENOSCOPY (EGD) WITH PROPOFOL (N/A )  Patient Location: PACU  Anesthesia Type:General  Level of Consciousness: sedated  Airway & Oxygen Therapy: Patient Spontanous Breathing and Patient connected to nasal cannula oxygen  Post-op Assessment: Report given to RN and Post -op Vital signs reviewed and stable  Post vital signs: Reviewed and stable  Last Vitals:  Vitals Value Taken Time  BP 123/70 11/27/19 0925  Temp    Pulse 68 11/27/19 0925  Resp 16 11/27/19 0925  SpO2 92 % 11/27/19 0925  Vitals shown include unvalidated device data.  Last Pain:  Vitals:   11/27/19 0824  TempSrc: Tympanic  PainSc: 0-No pain         Complications: No apparent anesthesia complications

## 2019-11-27 NOTE — Anesthesia Postprocedure Evaluation (Signed)
Anesthesia Post Note  Patient: Harry Patterson.  Procedure(s) Performed: ESOPHAGOGASTRODUODENOSCOPY (EGD) WITH PROPOFOL (N/A )  Patient location during evaluation: Endoscopy Anesthesia Type: General Level of consciousness: awake and alert Pain management: pain level controlled Vital Signs Assessment: post-procedure vital signs reviewed and stable Respiratory status: spontaneous breathing, nonlabored ventilation and respiratory function stable Cardiovascular status: blood pressure returned to baseline and stable Postop Assessment: no apparent nausea or vomiting Anesthetic complications: no     Last Vitals:  Vitals:   11/27/19 0930 11/27/19 0940  BP: 124/73 132/82  Pulse: 65 80  Resp: 16 16  Temp:    SpO2: 96% 95%    Last Pain:  Vitals:   11/27/19 0920  TempSrc: Tympanic  PainSc:                  Alphonsus Sias

## 2019-11-27 NOTE — Anesthesia Preprocedure Evaluation (Addendum)
Anesthesia Evaluation  Patient identified by MRN, date of birth, ID band Patient awake    Reviewed: Allergy & Precautions, H&P , NPO status , reviewed documented beta blocker date and time   Airway Mallampati: II  TM Distance: >3 FB Neck ROM: limited    Dental  (+) Caps, Chipped   Pulmonary sleep apnea and Continuous Positive Airway Pressure Ventilation ,    Pulmonary exam normal        Cardiovascular hypertension, Normal cardiovascular exam+ dysrhythmias   LBBB on EKG (old)   Neuro/Psych  Neuromuscular disease    GI/Hepatic GERD  ,  Endo/Other    Renal/GU Renal disease     Musculoskeletal  (+) Arthritis ,   Abdominal   Peds  Hematology   Anesthesia Other Findings Past Medical History: No date: Allergic rhinitis No date: Arthritis     Comment:  knuckles No date: Avascular necrosis of bones of both hips (HCC) No date: B12 deficiency No date: Barrett's esophagus without dysplasia No date: Bilateral renal cysts No date: Cancer ([place])     Comment:  skin No date: Enlarged prostate No date: GERD (gastroesophageal reflux disease) No date: Hypertension No date: LBBB (left bundle branch block) No date: Leukopenia No date: Peripheral neuropathy     Comment:  feet No date: Sleep apnea     Comment:  CPAP No date: Substance abuse [facility])  Past Surgical History: No date: APPENDECTOMY 06/11/2019: CATARACT EXTRACTION W/PHACO; Right     Comment:  Procedure: CATARACT EXTRACTION PHACO AND INTRAOCULAR               LENS PLACEMENT ([place]) RIGHT 00:54.6     22.0%    12.00;                Surgeon: Leandrew Koyanagi, MD;  Location: [place];  Service: Ophthalmology;  Laterality:               Right;  sleep apnea 06/30/2019: CATARACT EXTRACTION W/PHACO; Left     Comment:  Procedure: CATARACT EXTRACTION PHACO AND INTRAOCULAR               LENS PLACEMENT (IOC) LEFT TORIC LENS 9.97  01:09.1      14.4%;  Surgeon: Leandrew Koyanagi, MD;  Location:               [place];  Service: Ophthalmology;                Laterality: Left; No date: COLONOSCOPY W/ POLYPECTOMY     Comment:  02/08/00,11'12'04,05/05/05, 08/21/2008, 12/05/2013,               05/08/2014 06/06/2017: COLONOSCOPY WITH PROPOFOL; N/A     Comment:  Procedure: COLONOSCOPY WITH PROPOFOL;  Surgeon: Manya Silvas, MD;  Location[facility] ENDOSCOPY;  Service:               Endoscopy;  Laterality: N/A; No date: ESOPHAGOGASTRODUODENOSCOPY     Comment:  x7 06/06/2017: ESOPHAGOGASTRODUODENOSCOPY (EGD) WITH PROPOFOL; N/A     Comment:  Procedure: ESOPHAGOGASTRODUODENOSCOPY (EGD) WITH               PROPOFOL;  Surgeon: Manya Silvas, MD;  Location:               [facility] ENDOSCOPY;  Service: Endoscopy;  Laterality: N/A; No date:  HEMORRHOID SURGERY No date: TONSILLECTOMY  BMI    Body Mass Index: 28.89 kg/m      Reproductive/Obstetrics                            Anesthesia Physical Anesthesia Plan  ASA: III  Anesthesia Plan: General   Post-op Pain Management:    Induction: Intravenous  PONV Risk Score and Plan: Treatment may vary due to age or medical condition and TIVA  Airway Management Planned: Nasal Cannula and Natural Airway  Additional Equipment:   Intra-op Plan:   Post-operative Plan:   Informed Consent: I have reviewed the patients History and Physical, chart, labs and discussed the procedure including the risks, benefits and alternatives for the proposed anesthesia with the patient or authorized representative who has indicated his/her understanding and acceptance.     Dental Advisory Given  Plan Discussed with: CRNA  Anesthesia Plan Comments:         Anesthesia Quick Evaluation

## 2019-11-27 NOTE — Interval H&P Note (Signed)
History and Physical Interval Note:  11/27/2019 9:01 AM  Harry Patterson.  has presented today for surgery, with the diagnosis of BARRETT'S ESOPHAGUS.  The various methods of treatment have been discussed with the patient and family. After consideration of risks, benefits and other options for treatment, the patient has consented to  Procedure(s): ESOPHAGOGASTRODUODENOSCOPY (EGD) WITH PROPOFOL (N/A) as a surgical intervention.  The patient's history has been reviewed, patient examined, no change in status, stable for surgery.  I have reviewed the patient's chart and labs.  Questions were answered to the patient's satisfaction.     [place], [place]

## 2019-11-27 NOTE — H&P (Signed)
Outpatient short stay form Pre-procedure 11/27/2019 9:00 AM Harry Patterson K. Harry Patterson, M.D.  Primary Physician: Harry Patterson, M.D.  Reason for visit:  Barrett's esophagus  History of present illness:  74  y/o patient has a personal history of Barrett's esophagus without dysplasia. Patient denies hemetemesis, nausea, vomiting, dysphagia, weight loss. Takes PPI without significant side effects.     Current Facility-Administered Medications:  .  0.9 %  sodium chloride infusion, , Intravenous, Continuous, [place], Benay Pike, MD, Last Rate: 20 mL/hr at 11/27/19 0837, 1,000 mL at 11/27/19 0837  Medications Prior to Admission  Medication Sig Dispense Refill Last Dose  . acetaminophen (TYLENOL) 325 MG tablet Take 1,300 mg 2 (two) times daily as needed by mouth.     . ALPHA LIPOIC ACID PO Take by mouth daily.     Marland Kitchen amLODipine (NORVASC) 10 MG tablet Take 10 mg daily by mouth.     Marland Kitchen aspirin EC 81 MG tablet Take 81 mg daily by mouth.     Marland Kitchen atorvastatin (LIPITOR) 20 MG tablet Take 20 mg by mouth daily.  11   . celecoxib (CELEBREX) 200 MG capsule TAKE 1 CAPSULE BY MOUTH DAILY AS NEEDED FOR PAIN  11   . Cholecalciferol 1000 units capsule Take 1,000 Units daily by mouth.     . clobetasol (TEMOVATE) 0.05 % GEL Apply topically.     . cyclobenzaprine (FLEXERIL) 5 MG tablet Take 1 tablet (5 mg total) by mouth 3 (three) times daily as needed. 20 tablet 0   . diphenoxylate-atropine (LOMOTIL) 2.5-0.025 MG tablet Take by mouth.     . fluorouracil (EFUDEX) 5 % cream Apply to scalp twice daily for 4 weeks     . folic acid (FOLVITE) 1 MG tablet Take 1 mg daily by mouth.     . gabapentin (NEURONTIN) 300 MG capsule Take 300 mg 3 (three) times daily by mouth.     . hydrochlorothiazide (HYDRODIURIL) 12.5 MG tablet Take 12.5 mg daily by mouth.     . Lactobacillus Acid-Pectin (ACIDOPHILUS/PECTIN) CAPS Take by mouth.     . losartan (COZAAR) 100 MG tablet Take 100 mg daily by mouth.     . omega-3 acid ethyl esters (LOVAZA) 1  g capsule Take 2 capsules by mouth daily.  3   . pantoprazole (PROTONIX) 40 MG tablet Take 40 mg daily by mouth.     . selenium sulfide (SELSUN) 2.5 % shampoo Apply topically.     . sildenafil (VIAGRA) 100 MG tablet 1 tab 1 hour prior to intercourse     . tamsulosin (FLOMAX) 0.4 MG CAPS capsule Take 1 capsule (0.4 mg total) by mouth daily. 30 capsule 6   . triamcinolone ointment (KENALOG) 0.1 % Apply twice a day to affected areas on legs     . vitamin B-12 (CYANOCOBALAMIN) 1000 MCG tablet Take by mouth.     . vitamin C (ASCORBIC ACID) 500 MG tablet Take by mouth.        Allergies  Allergen Reactions  . Other Swelling    Pneumonia vaccine-swollen/painful/fever chills     Past Medical History:  Diagnosis Date  . Allergic rhinitis   . Arthritis    knuckles  . Avascular necrosis of bones of both hips ([place])   . B12 deficiency   . Barrett's esophagus without dysplasia   . Bilateral renal cysts   . Cancer ([place])    skin  . Enlarged prostate   . GERD (gastroesophageal reflux disease)   . Hypertension   .  LBBB (left bundle branch block)   . Leukopenia   . Peripheral neuropathy    feet  . Sleep apnea    CPAP  . Substance abuse ([place])     Review of systems:  Otherwise negative.    Physical Exam  Gen: Alert, oriented. Appears stated age.  HEENT: [place]/AT. PERRLA. Lungs: CTA, no wheezes. CV: RR nl S1, S2. Abd: soft, benign, no masses. BS+ Ext: No edema. Pulses 2+    Planned procedures: Proceed with EGD. The patient understands the nature of the planned procedure, indications, risks, alternatives and potential complications including but not limited to bleeding, infection, perforation, damage to internal organs and possible oversedation/side effects from anesthesia. The patient agrees and gives consent to proceed.  Please refer to procedure notes for findings, recommendations and patient disposition/instructions.     [name] K. Harry Patterson, M.D. Gastroenterology 11/27/2019  9:00  AM

## 2019-11-27 NOTE — Op Note (Signed)
[facility] Patient Name: Harry Patterson Procedure Date: 11/27/2019 9:02 AM MRN: [id number] Account #: [id number] Date of Birth: [birth year] Admit Type: Outpatient Age: 74 Room: [facility] ENDO ROOM 4 Gender: Male Note Status: Finalized Procedure:             Upper GI endoscopy Indications:           Surveillance for malignancy due to personal history of                         Barrett's esophagus Providers:             Benay Pike. Alice Reichert MD, MD Referring MD:          Ramonita Lab, MD (Referring MD) Medicines:             Propofol per Anesthesia Complications:         No immediate complications. Procedure:             Pre-Anesthesia Assessment:                        - The risks and benefits of the procedure and the                         sedation options and risks were discussed with the                         patient. All questions were answered and informed                         consent was obtained.                        - Patient identification and proposed procedure were                         verified prior to the procedure by the nurse. The                         procedure was verified in the procedure room.                        - ASA Grade Assessment: III - A patient with severe                         systemic disease.                        - After reviewing the risks and benefits, the patient                         was deemed in satisfactory condition to undergo the                         procedure.                        After obtaining informed consent, the endoscope was                         passed under direct vision. Throughout  the procedure,                         the patient's blood pressure, pulse, and oxygen                         saturations were monitored continuously. The Endoscope                         was introduced through the mouth, and advanced to the                         third part of duodenum. The upper  GI endoscopy was                         accomplished without difficulty. The patient tolerated                         the procedure well. Findings:      There were esophageal mucosal changes secondary to established       long-segment Barrett's disease present in the distal esophagus. The       maximum longitudinal extent of these mucosal changes was 4 cm in length.       Mucosa was biopsied with a cold forceps for histology in 4 quadrants at       intervals of 2 cm from 29 to 31 cm from the incisors. A total of 2       specimen bottles were sent to pathology.      The stomach was normal.      The examined duodenum was normal.      The exam was otherwise without abnormality. Impression:            - Esophageal mucosal changes secondary to established                         long-segment Barrett's disease. Biopsied.                        - Normal stomach.                        - Normal examined duodenum.                        - The examination was otherwise normal. Recommendation:        - Patient has a contact number available for                         emergencies. The signs and symptoms of potential                         delayed complications were discussed with the patient.                         Return to normal activities tomorrow. Written                         discharge instructions were provided to the patient.                        -  Resume previous diet.                        - Continue present medications.                        - Await pathology results.                        - Repeat upper endoscopy in 3 years for surveillance                         based on pathology results.                        - Return to GI office PRN. Procedure Code(s):     --- Professional ---                        [phone], Esophagogastroduodenoscopy, flexible,                         transoral; with biopsy, single or multiple Diagnosis Code(s):     --- Professional ---                         K22.70, Barrett's esophagus without dysplasia CPT copyright 2019 American Medical Association. All rights reserved. The codes documented in this report are preliminary and upon coder review may  be revised to meet current compliance requirements. Efrain Sella MD, MD 11/27/2019 9:25:40 AM This report has been signed electronically. Number of Addenda: 0 Note Initiated On: 11/27/2019 9:02 AM Estimated Blood Loss:  Estimated blood loss: none.      [facility]

## 2019-11-28 LAB — SURGICAL PATHOLOGY

## 2020-04-29 ENCOUNTER — Ambulatory Visit: Payer: BC Managed Care – PPO | Admitting: Urology

## 2020-05-05 ENCOUNTER — Ambulatory Visit: Payer: Self-pay | Admitting: Urology

## 2020-05-12 ENCOUNTER — Other Ambulatory Visit: Payer: Self-pay

## 2020-05-12 ENCOUNTER — Ambulatory Visit (INDEPENDENT_AMBULATORY_CARE_PROVIDER_SITE_OTHER): Payer: BC Managed Care – PPO | Admitting: Urology

## 2020-05-12 ENCOUNTER — Encounter: Payer: Self-pay | Admitting: Urology

## 2020-05-12 VITALS — BP 146/83 | HR 80 | Ht 68.0 in | Wt 201.0 lb

## 2020-05-12 DIAGNOSIS — N411 Chronic prostatitis: Secondary | ICD-10-CM

## 2020-05-12 DIAGNOSIS — N400 Enlarged prostate without lower urinary tract symptoms: Secondary | ICD-10-CM

## 2020-05-12 NOTE — Progress Notes (Signed)
05/12/2020 11:29 AM   Harry Patterson. Jun 29, 1946 [id number]  Referring provider: Adin Hector, MD [facility] [place],  [place] [address]  Chief Complaint  Patient presents with  . Prostatitis    HPI: 74 y.o. male presents for follow-up of BPH and chronic nonbacterial prostatitis.   No bothersome LUTS  Continues tamsulosin prn prostatitis symptoms which are typically penile/perineal discomfort  Denies dysuria, gross hematuria  Denies flank, abdominal or pelvic pain  PSA with PCP 04/14/2020 was 0.31   PMH: Past Medical History:  Diagnosis Date  . Allergic rhinitis   . Arthritis    knuckles  . Avascular necrosis of bones of both hips ([place])   . B12 deficiency   . Barrett's esophagus without dysplasia   . Bilateral renal cysts   . Cancer ([place])    skin  . Enlarged prostate   . GERD (gastroesophageal reflux disease)   . Hypertension   . LBBB (left bundle branch block)   . Leukopenia   . Peripheral neuropathy    feet  . Sleep apnea    CPAP  . Substance abuse [facility])     Surgical History: Past Surgical History:  Procedure Laterality Date  . APPENDECTOMY    . CATARACT EXTRACTION W/PHACO Right 06/11/2019   Procedure: CATARACT EXTRACTION PHACO AND INTRAOCULAR LENS PLACEMENT (IOC) RIGHT 00:54.6     22.0%    12.00;  Surgeon: Leandrew Koyanagi, MD;  Location: [place];  Service: Ophthalmology;  Laterality: Right;  sleep apnea  . CATARACT EXTRACTION W/PHACO Left 06/30/2019   Procedure: CATARACT EXTRACTION PHACO AND INTRAOCULAR LENS PLACEMENT (IOC) LEFT TORIC LENS 9.97  01:09.1  14.4%;  Surgeon: Leandrew Koyanagi, MD;  Location: [place];  Service: Ophthalmology;  Laterality: Left;  . COLONOSCOPY W/ POLYPECTOMY     02/08/00,11'12'04,05/05/05, 08/21/2008, 12/05/2013, 05/08/2014  . COLONOSCOPY WITH PROPOFOL N/A 06/06/2017   Procedure: COLONOSCOPY WITH PROPOFOL;  Surgeon: Manya Silvas, MD;  Location:  [facility] ENDOSCOPY;  Service: Endoscopy;  Laterality: N/A;  . ESOPHAGOGASTRODUODENOSCOPY     x7  . ESOPHAGOGASTRODUODENOSCOPY (EGD) WITH PROPOFOL N/A 06/06/2017   Procedure: ESOPHAGOGASTRODUODENOSCOPY (EGD) WITH PROPOFOL;  Surgeon: Manya Silvas, MD;  Location: [facility] ENDOSCOPY;  Service: Endoscopy;  Laterality: N/A;  . ESOPHAGOGASTRODUODENOSCOPY (EGD) WITH PROPOFOL N/A 11/27/2019   Procedure: ESOPHAGOGASTRODUODENOSCOPY (EGD) WITH PROPOFOL;  Surgeon: Toledo, Benay Pike, MD;  Location: [facility] ENDOSCOPY;  Service: Gastroenterology;  Laterality: N/A;  . HEMORRHOID SURGERY    . TONSILLECTOMY      Home Medications:  Allergies as of 05/12/2020      Reactions   Other Swelling   Pneumonia vaccine-swollen/painful/fever chills      Medication List       Accurate as of May 12, 2020 11:29 AM. If you have any questions, ask your nurse or doctor.        acetaminophen 325 MG tablet Commonly known as: TYLENOL Take 1,300 mg 2 (two) times daily as needed by mouth.   Acidophilus/Pectin Caps Take by mouth.   ALPHA LIPOIC ACID PO Take by mouth daily.   amLODipine 10 MG tablet Commonly known as: NORVASC Take 10 mg daily by mouth.   aspirin EC 81 MG tablet Take 81 mg daily by mouth.   atorvastatin 20 MG tablet Commonly known as: LIPITOR Take 20 mg by mouth daily.   celecoxib 200 MG capsule Commonly known as: CELEBREX TAKE 1 CAPSULE BY MOUTH DAILY AS NEEDED FOR PAIN   Cholecalciferol 25 MCG (  1000 UT) capsule Take 1,000 Units daily by mouth.   clobetasol 0.05 % Gel Commonly known as: TEMOVATE Apply topically.   clotrimazole 10 MG troche Commonly known as: MYCELEX Take by mouth.   cyclobenzaprine 5 MG tablet Commonly known as: FLEXERIL Take 1 tablet (5 mg total) by mouth 3 (three) times daily as needed.   diphenoxylate-atropine 2.5-0.025 MG tablet Commonly known as: LOMOTIL Take by mouth.   fluorouracil 5 % cream Commonly known as: EFUDEX Apply to scalp twice daily for 4  weeks   folic acid 1 MG tablet Commonly known as: FOLVITE Take 1 mg daily by mouth.   gabapentin 300 MG capsule Commonly known as: NEURONTIN Take 300 mg 3 (three) times daily by mouth.   hydrochlorothiazide 12.5 MG tablet Commonly known as: HYDRODIURIL Take 12.5 mg daily by mouth.   losartan 100 MG tablet Commonly known as: COZAAR Take 100 mg daily by mouth.   omega-3 acid ethyl esters 1 g capsule Commonly known as: LOVAZA Take 2 capsules by mouth daily.   pantoprazole 40 MG tablet Commonly known as: PROTONIX Take 40 mg daily by mouth.   selenium sulfide 2.5 % shampoo Commonly known as: SELSUN Apply topically.   sildenafil 100 MG tablet Commonly known as: VIAGRA 1 tab 1 hour prior to intercourse   tamsulosin 0.4 MG Caps capsule Commonly known as: FLOMAX Take 1 capsule (0.4 mg total) by mouth daily.   triamcinolone ointment 0.1 % Commonly known as: KENALOG Apply twice a day to affected areas on legs   vitamin B-12 1000 MCG tablet Commonly known as: CYANOCOBALAMIN Take by mouth.   vitamin C 500 MG tablet Commonly known as: ASCORBIC ACID Take by mouth.       Allergies:  Allergies  Allergen Reactions  . Other Swelling    Pneumonia vaccine-swollen/painful/fever chills    Family History: Family History  Problem Relation Age of Onset  . Heart disease Mother     Social History:  reports that he has never smoked. He has never used smokeless tobacco. He reports current alcohol use of about 2.0 standard drinks of alcohol per week. He reports that he does not use drugs.   Physical Exam: BP (!) 146/83 (BP Location: Left Arm, Patient Position: Sitting, Cuff Size: Normal)   Pulse 80   Ht 5\' 8"  (1.727 m)   Wt 201 lb (91.2 kg)   BMI 30.56 kg/m   Constitutional:  Alert and oriented, No acute distress. HEENT: [place] AT, moist mucus membranes.  Trachea midline, no masses. Cardiovascular: No clubbing, cyanosis, or edema. Respiratory: Normal respiratory effort, no  increased work of breathing.   Assessment & Plan:    1.  Chronic nonbacterial prostatitis  Stable  Continue tamsulosin prn  Follow-up 1 year  2.  BPH without LUTS   Abbie Sons, MD  [facility] [address] [place], [place] [address] [phone]

## 2020-05-13 ENCOUNTER — Encounter: Payer: Self-pay | Admitting: Urology

## 2021-05-16 ENCOUNTER — Ambulatory Visit: Payer: Self-pay | Admitting: Urology

## 2021-05-25 ENCOUNTER — Ambulatory Visit (INDEPENDENT_AMBULATORY_CARE_PROVIDER_SITE_OTHER): Payer: BC Managed Care – PPO | Admitting: Urology

## 2021-05-25 ENCOUNTER — Other Ambulatory Visit: Payer: Self-pay

## 2021-05-25 ENCOUNTER — Encounter: Payer: Self-pay | Admitting: Urology

## 2021-05-25 VITALS — BP 127/77 | Ht 68.0 in | Wt 195.0 lb

## 2021-05-25 DIAGNOSIS — N400 Enlarged prostate without lower urinary tract symptoms: Secondary | ICD-10-CM

## 2021-05-25 LAB — URINALYSIS, COMPLETE
Bilirubin, UA: NEGATIVE
Glucose, UA: NEGATIVE
Ketones, UA: NEGATIVE
Leukocytes,UA: NEGATIVE
Nitrite, UA: NEGATIVE
RBC, UA: NEGATIVE
Specific Gravity, UA: 1.02 (ref 1.005–1.030)
Urobilinogen, Ur: 0.2 mg/dL (ref 0.2–1.0)
pH, UA: 7 (ref 5.0–7.5)

## 2021-05-25 LAB — MICROSCOPIC EXAMINATION
Bacteria, UA: NONE SEEN
RBC, Urine: NONE SEEN /hpf (ref 0–2)

## 2021-05-25 LAB — BLADDER SCAN AMB NON-IMAGING: Scan Result: 0

## 2021-05-25 NOTE — Progress Notes (Signed)
05/25/2021 10:28 AM   [name]. November 12, 1945 [id number]  Referring provider: Adin Hector, MD [place] [facility] [place],  [place] [address]  Chief Complaint  Patient presents with   Benign Prostatic Hypertrophy   Urologic history: 1.  Chronic nonbacterial prostatitis Tamsulosin prn symptom flare  2.  BPH No bothersome LUTS   HPI: 75 y.o. male presents for annual follow-up.  Doing well since last visit No bothersome LUTS Denies gross hematuria Denies flank, abdominal or pelvic pain This morning when giving sample and noted mild dysuria Has not required tamsulosin since his visit last year IPSS 1/35  PMH: Past Medical History:  Diagnosis Date   Allergic rhinitis    Arthritis    knuckles   Avascular necrosis of bones of both hips ([facility])    B12 deficiency    Barrett's esophagus without dysplasia    Bilateral renal cysts    Cancer ([facility])    skin   Enlarged prostate    GERD (gastroesophageal reflux disease)    Hypertension    LBBB (left bundle branch block)    Leukopenia    Peripheral neuropathy    feet   Sleep apnea    CPAP   Substance abuse ([place])     Surgical History: Past Surgical History:  Procedure Laterality Date   APPENDECTOMY     CATARACT EXTRACTION W/PHACO Right 06/11/2019   Procedure: CATARACT EXTRACTION PHACO AND INTRAOCULAR LENS PLACEMENT (IOC) RIGHT 00:54.6     22.0%    12.00;  Surgeon: Leandrew Koyanagi, MD;  Location: [place];  Service: Ophthalmology;  Laterality: Right;  sleep apnea   CATARACT EXTRACTION W/PHACO Left 06/30/2019   Procedure: CATARACT EXTRACTION PHACO AND INTRAOCULAR LENS PLACEMENT (IOC) LEFT TORIC LENS 9.97  01:09.1  14.4%;  Surgeon: Leandrew Koyanagi, MD;  Location: [place];  Service: Ophthalmology;  Laterality: Left;   COLONOSCOPY W/ POLYPECTOMY     02/08/00,11'12'04,05/05/05, 08/21/2008, 12/05/2013, 05/08/2014   COLONOSCOPY WITH PROPOFOL N/A 06/06/2017    Procedure: COLONOSCOPY WITH PROPOFOL;  Surgeon: Manya Silvas, MD;  Location: [facility] ENDOSCOPY;  Service: Endoscopy;  Laterality: N/A;   ESOPHAGOGASTRODUODENOSCOPY     x7   ESOPHAGOGASTRODUODENOSCOPY (EGD) WITH PROPOFOL N/A 06/06/2017   Procedure: ESOPHAGOGASTRODUODENOSCOPY (EGD) WITH PROPOFOL;  Surgeon: Manya Silvas, MD;  Location: [facility] ENDOSCOPY;  Service: Endoscopy;  Laterality: N/A;   ESOPHAGOGASTRODUODENOSCOPY (EGD) WITH PROPOFOL N/A 11/27/2019   Procedure: ESOPHAGOGASTRODUODENOSCOPY (EGD) WITH PROPOFOL;  Surgeon: Toledo, Benay Pike, MD;  Location: [facility] ENDOSCOPY;  Service: Gastroenterology;  Laterality: N/A;   HEMORRHOID SURGERY     TONSILLECTOMY      Home Medications:  Allergies as of 05/25/2021       Reactions   Other Swelling   Pneumonia vaccine-swollen/painful/fever chills        Medication List        Accurate as of May 25, 2021 10:28 AM. If you have any questions, ask your nurse or doctor.          acetaminophen 325 MG tablet Commonly known as: TYLENOL Take 1,300 mg 2 (two) times daily as needed by mouth.   Acidophilus/Pectin Caps Take by mouth.   ALPHA LIPOIC ACID PO Take by mouth daily.   amLODipine 10 MG tablet Commonly known as: NORVASC Take 10 mg daily by mouth.   aspirin EC 81 MG tablet Take 81 mg daily by mouth.   atorvastatin 20 MG tablet Commonly known as: LIPITOR Take 20 mg by mouth daily.  celecoxib 200 MG capsule Commonly known as: CELEBREX TAKE 1 CAPSULE BY MOUTH DAILY AS NEEDED FOR PAIN   Cholecalciferol 25 MCG (1000 UT) capsule Take 1,000 Units daily by mouth.   clobetasol 0.05 % Gel Commonly known as: TEMOVATE Apply topically.   clotrimazole 10 MG troche Commonly known as: MYCELEX Take by mouth.   cyclobenzaprine 5 MG tablet Commonly known as: FLEXERIL Take 1 tablet (5 mg total) by mouth 3 (three) times daily as needed.   diphenoxylate-atropine 2.5-0.025 MG tablet Commonly known as: LOMOTIL Take by mouth.    fluorouracil 5 % cream Commonly known as: EFUDEX Apply to scalp twice daily for 4 weeks   folic acid 1 MG tablet Commonly known as: FOLVITE Take 1 mg daily by mouth.   gabapentin 300 MG capsule Commonly known as: NEURONTIN Take 300 mg 3 (three) times daily by mouth.   hydrochlorothiazide 12.5 MG tablet Commonly known as: HYDRODIURIL Take 12.5 mg daily by mouth.   losartan 100 MG tablet Commonly known as: COZAAR Take 100 mg daily by mouth.   omega-3 acid ethyl esters 1 g capsule Commonly known as: LOVAZA Take 2 capsules by mouth daily.   pantoprazole 40 MG tablet Commonly known as: PROTONIX Take 40 mg daily by mouth.   selenium sulfide 2.5 % shampoo Commonly known as: SELSUN Apply topically.   sildenafil 100 MG tablet Commonly known as: VIAGRA 1 tab 1 hour prior to intercourse   tamsulosin 0.4 MG Caps capsule Commonly known as: FLOMAX Take 1 capsule (0.4 mg total) by mouth daily.   triamcinolone ointment 0.1 % Commonly known as: KENALOG Apply twice a day to affected areas on legs   vitamin B-12 1000 MCG tablet Commonly known as: CYANOCOBALAMIN Take by mouth.   vitamin C 500 MG tablet Commonly known as: ASCORBIC ACID Take by mouth.        Allergies:  Allergies  Allergen Reactions   Other Swelling    Pneumonia vaccine-swollen/painful/fever chills    Family History: Family History  Problem Relation Age of Onset   Heart disease Mother     Social History:  reports that he has never smoked. He has never used smokeless tobacco. He reports current alcohol use of about 2.0 standard drinks per week. He reports that he does not use drugs.   Physical Exam: BP 127/77   Ht 5\' 8"  (1.727 m)   Wt 195 lb (88.5 kg)   BMI 29.65 kg/m   Constitutional:  Alert and oriented, No acute distress. HEENT: [place] AT, moist mucus membranes.  Trachea midline, no masses. Cardiovascular: No clubbing, cyanosis, or edema. Respiratory: Normal respiratory effort, no increased  work of breathing. Psychiatric: Normal mood and affect.   Assessment & Plan:    1.  BPH without LUTS  2.  Chronic nonbacterial prostatitis Urinalysis today clear Continue tamsulosin as needed He desires to continue annual follow-up    Abbie Sons, MD  [facility] [address] [place], [place] [address] [phone]

## 2021-05-26 ENCOUNTER — Encounter: Payer: Self-pay | Admitting: Urology

## 2021-05-26 ENCOUNTER — Telehealth: Payer: Self-pay | Admitting: *Deleted

## 2021-05-26 NOTE — Telephone Encounter (Signed)
-----   Message from Abbie Sons, MD sent at 05/26/2021 11:39 AM EDT ----- Please let patient know his urinalysis showed no evidence of infection

## 2021-05-26 NOTE — Telephone Encounter (Signed)
Notified patient as instructed,.  

## 2022-05-25 ENCOUNTER — Ambulatory Visit: Payer: BC Managed Care – PPO | Admitting: Urology

## 2022-05-26 ENCOUNTER — Ambulatory Visit: Payer: BC Managed Care – PPO | Admitting: Urology

## 2022-06-07 ENCOUNTER — Ambulatory Visit: Payer: BC Managed Care – PPO | Admitting: Urology

## 2022-06-14 ENCOUNTER — Encounter: Payer: Self-pay | Admitting: Urology

## 2022-06-14 ENCOUNTER — Ambulatory Visit (INDEPENDENT_AMBULATORY_CARE_PROVIDER_SITE_OTHER): Payer: BC Managed Care – PPO | Admitting: Urology

## 2022-06-14 VITALS — BP 180/90 | HR 73 | Ht 68.0 in | Wt 195.0 lb

## 2022-06-14 DIAGNOSIS — N400 Enlarged prostate without lower urinary tract symptoms: Secondary | ICD-10-CM | POA: Diagnosis not present

## 2022-06-14 LAB — URINALYSIS, COMPLETE
Bilirubin, UA: NEGATIVE
Glucose, UA: NEGATIVE
Ketones, UA: NEGATIVE
Leukocytes,UA: NEGATIVE
Nitrite, UA: NEGATIVE
RBC, UA: NEGATIVE
Specific Gravity, UA: 1.02 (ref 1.005–1.030)
Urobilinogen, Ur: 0.2 mg/dL (ref 0.2–1.0)
pH, UA: 6 (ref 5.0–7.5)

## 2022-06-14 LAB — MICROSCOPIC EXAMINATION: Bacteria, UA: NONE SEEN

## 2022-06-14 LAB — BLADDER SCAN AMB NON-IMAGING: Scan Result: 23

## 2022-06-14 NOTE — Progress Notes (Signed)
06/14/2022 3:10 PM   Harry Patterson. 1946-06-28 [id number]  Referring provider: Adin Hector, MD [place] [facility] [place],  [place] [address]  Chief Complaint  Patient presents with   Benign Prostatic Hypertrophy   Urologic history: 1.  Chronic nonbacterial prostatitis Tamsulosin prn symptom flare  2.  BPH No bothersome LUTS   HPI: 76 y.o. male presents for annual follow-up.  Doing well since last visit No bothersome LUTS Denies gross hematuria Denies flank, abdominal or pelvic pain This morning when giving sample and noted mild dysuria Has not required tamsulosin since in over 1 year IPSS 1/35  PMH: Past Medical History:  Diagnosis Date   Allergic rhinitis    Arthritis    knuckles   Avascular necrosis of bones of both hips ([facility])    B12 deficiency    Barrett's esophagus without dysplasia    Bilateral renal cysts    Cancer ([facility])    skin   Enlarged prostate    GERD (gastroesophageal reflux disease)    Hypertension    LBBB (left bundle branch block)    Leukopenia    Peripheral neuropathy    feet   Sleep apnea    CPAP   Substance abuse ([place])     Surgical History: Past Surgical History:  Procedure Laterality Date   APPENDECTOMY     CATARACT EXTRACTION W/PHACO Right 06/11/2019   Procedure: CATARACT EXTRACTION PHACO AND INTRAOCULAR LENS PLACEMENT (IOC) RIGHT 00:54.6     22.0%    12.00;  Surgeon: Leandrew Koyanagi, MD;  Location: [place];  Service: Ophthalmology;  Laterality: Right;  sleep apnea   CATARACT EXTRACTION W/PHACO Left 06/30/2019   Procedure: CATARACT EXTRACTION PHACO AND INTRAOCULAR LENS PLACEMENT (IOC) LEFT TORIC LENS 9.97  01:09.1  14.4%;  Surgeon: Leandrew Koyanagi, MD;  Location: [place];  Service: Ophthalmology;  Laterality: Left;   COLONOSCOPY W/ POLYPECTOMY     02/08/00,11'12'04,05/05/05, 08/21/2008, 12/05/2013, 05/08/2014   COLONOSCOPY WITH PROPOFOL N/A 06/06/2017    Procedure: COLONOSCOPY WITH PROPOFOL;  Surgeon: Manya Silvas, MD;  Location: [facility] ENDOSCOPY;  Service: Endoscopy;  Laterality: N/A;   ESOPHAGOGASTRODUODENOSCOPY     x7   ESOPHAGOGASTRODUODENOSCOPY (EGD) WITH PROPOFOL N/A 06/06/2017   Procedure: ESOPHAGOGASTRODUODENOSCOPY (EGD) WITH PROPOFOL;  Surgeon: Manya Silvas, MD;  Location: [facility] ENDOSCOPY;  Service: Endoscopy;  Laterality: N/A;   ESOPHAGOGASTRODUODENOSCOPY (EGD) WITH PROPOFOL N/A 11/27/2019   Procedure: ESOPHAGOGASTRODUODENOSCOPY (EGD) WITH PROPOFOL;  Surgeon: Toledo, Benay Pike, MD;  Location: [facility] ENDOSCOPY;  Service: Gastroenterology;  Laterality: N/A;   HEMORRHOID SURGERY     TONSILLECTOMY      Home Medications:  Allergies as of 06/14/2022       Reactions   Other Swelling   Pneumonia vaccine-swollen/painful/fever chills        Medication List        Accurate as of June 14, 2022  3:10 PM. If you have any questions, ask your nurse or doctor.          acetaminophen 325 MG tablet Commonly known as: TYLENOL Take 1,300 mg 2 (two) times daily as needed by mouth.   Acidophilus/Pectin Caps Take by mouth.   ALPHA LIPOIC ACID PO Take by mouth daily.   amLODipine 10 MG tablet Commonly known as: NORVASC Take 10 mg daily by mouth.   ascorbic acid 500 MG tablet Commonly known as: VITAMIN C Take by mouth.   aspirin EC 81 MG tablet Take 81 mg daily by mouth.  atorvastatin 20 MG tablet Commonly known as: LIPITOR Take 20 mg by mouth daily.   celecoxib 200 MG capsule Commonly known as: CELEBREX TAKE 1 CAPSULE BY MOUTH DAILY AS NEEDED FOR PAIN   Cholecalciferol 25 MCG (1000 UT) capsule Take 1,000 Units daily by mouth.   clobetasol 0.05 % Gel Commonly known as: TEMOVATE Apply topically.   clotrimazole 10 MG troche Commonly known as: MYCELEX Take by mouth.   cyanocobalamin 1000 MCG tablet Commonly known as: VITAMIN B12 Take by mouth.   cyclobenzaprine 5 MG tablet Commonly known as:  FLEXERIL Take 1 tablet (5 mg total) by mouth 3 (three) times daily as needed.   diphenoxylate-atropine 2.5-0.025 MG tablet Commonly known as: LOMOTIL Take by mouth.   fluorouracil 5 % cream Commonly known as: EFUDEX Apply to scalp twice daily for 4 weeks   folic acid 1 MG tablet Commonly known as: FOLVITE Take 1 mg daily by mouth.   gabapentin 300 MG capsule Commonly known as: NEURONTIN Take 300 mg 3 (three) times daily by mouth.   hydrochlorothiazide 12.5 MG tablet Commonly known as: HYDRODIURIL Take 12.5 mg daily by mouth.   losartan 100 MG tablet Commonly known as: COZAAR Take 100 mg daily by mouth.   omega-3 acid ethyl esters 1 g capsule Commonly known as: LOVAZA Take 2 capsules by mouth daily.   pantoprazole 40 MG tablet Commonly known as: PROTONIX Take 40 mg daily by mouth.   selenium sulfide 2.5 % shampoo Commonly known as: SELSUN Apply topically.   sildenafil 100 MG tablet Commonly known as: VIAGRA 1 tab 1 hour prior to intercourse   tamsulosin 0.4 MG Caps capsule Commonly known as: FLOMAX Take 1 capsule (0.4 mg total) by mouth daily.   triamcinolone ointment 0.1 % Commonly known as: KENALOG Apply twice a day to affected areas on legs        Allergies:  Allergies  Allergen Reactions   Other Swelling    Pneumonia vaccine-swollen/painful/fever chills    Family History: Family History  Problem Relation Age of Onset   Heart disease Mother     Social History:  reports that he has never smoked. He has never used smokeless tobacco. He reports current alcohol use of about 2.0 standard drinks of alcohol per week. He reports that he does not use drugs.   Physical Exam: BP (!) 180/90   Pulse 73   Ht '5\' 8"'$  (1.727 m)   Wt 195 lb (88.5 kg)   BMI 29.65 kg/m   Constitutional:  Alert and oriented, No acute distress. HEENT: [place] AT Respiratory: Normal respiratory effort, no increased work of breathing. Psychiatric: Normal mood and  affect.   Assessment & Plan:    1.  BPH without LUTS  2.  Chronic nonbacterial prostatitis Urinalysis today clear Continue tamsulosin as needed He desires to continue annual follow-up    Abbie Sons, MD  [place] [address] [place], [place] [address] [phone]

## 2023-01-09 ENCOUNTER — Encounter: Payer: Self-pay | Admitting: Emergency Medicine

## 2023-01-16 ENCOUNTER — Encounter
Admission: RE | Disposition: A | Discharge status: Home / Self Care | Payer: Self-pay | Source: Home / Self Care | Attending: Gastroenterology

## 2023-01-16 ENCOUNTER — Ambulatory Visit
Admission: RE | Admit: 2023-01-16 | Discharge: 2023-01-16 | Disposition: A | Discharge status: Home / Self Care | Payer: BC Managed Care – PPO | Attending: Gastroenterology | Admitting: Gastroenterology

## 2023-01-16 ENCOUNTER — Ambulatory Visit: Payer: BC Managed Care – PPO | Admitting: Anesthesiology

## 2023-01-16 ENCOUNTER — Encounter: Payer: Self-pay | Admitting: *Deleted

## 2023-01-16 DIAGNOSIS — I10 Essential (primary) hypertension: Secondary | ICD-10-CM | POA: Diagnosis not present

## 2023-01-16 DIAGNOSIS — E785 Hyperlipidemia, unspecified: Secondary | ICD-10-CM | POA: Diagnosis not present

## 2023-01-16 DIAGNOSIS — K2270 Barrett's esophagus without dysplasia: Secondary | ICD-10-CM | POA: Diagnosis not present

## 2023-01-16 DIAGNOSIS — Q438 Other specified congenital malformations of intestine: Secondary | ICD-10-CM | POA: Insufficient documentation

## 2023-01-16 DIAGNOSIS — Z1211 Encounter for screening for malignant neoplasm of colon: Secondary | ICD-10-CM | POA: Insufficient documentation

## 2023-01-16 DIAGNOSIS — K640 First degree hemorrhoids: Secondary | ICD-10-CM | POA: Diagnosis not present

## 2023-01-16 DIAGNOSIS — K5730 Diverticulosis of large intestine without perforation or abscess without bleeding: Secondary | ICD-10-CM | POA: Insufficient documentation

## 2023-01-16 DIAGNOSIS — Z86010 Personal history of colonic polyps: Secondary | ICD-10-CM | POA: Diagnosis not present

## 2023-01-16 DIAGNOSIS — I447 Left bundle-branch block, unspecified: Secondary | ICD-10-CM | POA: Diagnosis not present

## 2023-01-16 DIAGNOSIS — G4730 Sleep apnea, unspecified: Secondary | ICD-10-CM | POA: Diagnosis not present

## 2023-01-16 HISTORY — PX: BIOPSY: SHX5522

## 2023-01-16 HISTORY — PX: ESOPHAGOGASTRODUODENOSCOPY (EGD) WITH PROPOFOL: SHX5813

## 2023-01-16 HISTORY — PX: COLONOSCOPY WITH PROPOFOL: SHX5780

## 2023-01-16 SURGERY — COLONOSCOPY WITH PROPOFOL
Anesthesia: General

## 2023-01-16 MED ORDER — LIDOCAINE HCL (CARDIAC) PF 100 MG/5ML IV SOSY
PREFILLED_SYRINGE | INTRAVENOUS | Status: DC | PRN
  Administered 2023-01-16: 50 mg via INTRAVENOUS

## 2023-01-16 MED ORDER — SODIUM CHLORIDE 0.9 % IV SOLN: INTRAVENOUS | Status: DC

## 2023-01-16 MED ORDER — GLYCOPYRROLATE 0.2 MG/ML IJ SOLN
INTRAMUSCULAR | Status: DC | PRN
  Administered 2023-01-16: .2 mg via INTRAVENOUS

## 2023-01-16 MED ORDER — PROPOFOL 10 MG/ML IV BOLUS
INTRAVENOUS | Status: DC | PRN
  Administered 2023-01-16: 70 mg via INTRAVENOUS
  Administered 2023-01-16: 20 mg via INTRAVENOUS

## 2023-01-16 MED ORDER — PROPOFOL 500 MG/50ML IV EMUL
INTRAVENOUS | Status: DC | PRN
  Administered 2023-01-16: 150 ug/kg/min via INTRAVENOUS

## 2023-01-16 NOTE — Transfer of Care (Signed)
Immediate Anesthesia Transfer of Care Note  Patient: Harry Patterson.  Procedure(s) Performed: COLONOSCOPY WITH PROPOFOL ESOPHAGOGASTRODUODENOSCOPY (EGD) WITH PROPOFOL  Patient Location: PACU and Endoscopy Unit  Anesthesia Type:MAC  Level of Consciousness: sedated  Airway & Oxygen Therapy: Patient Spontanous Breathing and Patient connected to face mask oxygen  Post-op Assessment: Report given to RN and Post -op Vital signs reviewed and stable  Post vital signs: Reviewed and stable  Last Vitals:  Vitals Value Taken Time  BP 105/63 01/16/23 1012  Temp    Pulse 79 01/16/23 1013  Resp 17 01/16/23 1013  SpO2 97 % 01/16/23 1013  Vitals shown include unvalidated device data.  Last Pain:  Vitals:   01/16/23 0912  TempSrc: Temporal         Complications: No notable events documented.

## 2023-01-16 NOTE — Interval H&P Note (Signed)
History and Physical Interval Note:  01/16/2023 9:35 AM  Harry Patterson.  has presented today for surgery, with the diagnosis of Barrett's esophagus without dysplasia ,hx of adenomatous polyp of colon.  The various methods of treatment have been discussed with the patient and family. After consideration of risks, benefits and other options for treatment, the patient has consented to  Procedure(s): COLONOSCOPY WITH PROPOFOL (N/A) ESOPHAGOGASTRODUODENOSCOPY (EGD) WITH PROPOFOL (N/A) as a surgical intervention.  The patient's history has been reviewed, patient examined, no change in status, stable for surgery.  I have reviewed the patient's chart and labs.  Questions were answered to the patient's satisfaction.     Regis Bill  Ok to proceed with EGD/Colonoscopy

## 2023-01-16 NOTE — Anesthesia Preprocedure Evaluation (Signed)
Anesthesia Evaluation  Patient identified by MRN, date of birth, ID band Patient awake    Reviewed: Allergy & Precautions, NPO status , Patient's Chart, lab work & pertinent test results  Airway Mallampati: III  TM Distance: <3 FB Neck ROM: full    Dental  (+) Chipped, Poor Dentition, Missing   Pulmonary neg shortness of breath, sleep apnea    Pulmonary exam normal        Cardiovascular Exercise Tolerance: Good hypertension, Normal cardiovascular exam+ dysrhythmias (LBBB)      Neuro/Psych  Neuromuscular disease  negative psych ROS   GI/Hepatic Neg liver ROS,GERD  Controlled,,  Endo/Other  negative endocrine ROS    Renal/GU Renal disease  negative genitourinary   Musculoskeletal   Abdominal   Peds  Hematology negative hematology ROS (+)   Anesthesia Other Findings Past Medical History: No date: Allergic rhinitis No date: Arthritis     Comment:  knuckles No date: Avascular necrosis of bones of both hips (HCC) No date: B12 deficiency No date: Barrett's esophagus without dysplasia No date: Bilateral renal cysts No date: Cancer (HCC)     Comment:  skin No date: Enlarged prostate No date: GERD (gastroesophageal reflux disease) No date: Hypertension No date: LBBB (left bundle branch block) No date: Leukopenia No date: Peripheral neuropathy     Comment:  feet No date: Sleep apnea     Comment:  CPAP No date: Substance abuse [facility])  Past Surgical History: No date: APPENDECTOMY 06/11/2019: CATARACT EXTRACTION W/PHACO; Right     Comment:  Procedure: CATARACT EXTRACTION PHACO AND INTRAOCULAR               LENS PLACEMENT (IOC) RIGHT 00:54.6     22.0%    12.00;                Surgeon: Lockie Mola, MD;  Location: [facility]               SURGERY [facility];  Service: Ophthalmology;  Laterality:               Right;  sleep apnea 06/30/2019: CATARACT EXTRACTION W/PHACO; Left     Comment:  Procedure: CATARACT EXTRACTION  PHACO AND INTRAOCULAR               LENS PLACEMENT (IOC) LEFT TORIC LENS 9.97  01:09.1                14.4%;  Surgeon: Lockie Mola, MD;  Location:               [facility] SURGERY [facility];  Service: Ophthalmology;                Laterality: Left; No date: COLONOSCOPY W/ POLYPECTOMY     Comment:  02/08/00,11'12'04,05/05/05, 08/21/2008, 12/05/2013,               05/08/2014 06/06/2017: COLONOSCOPY WITH PROPOFOL; N/A     Comment:  Procedure: COLONOSCOPY WITH PROPOFOL;  Surgeon: Scot Jun, MD;  Location: [facility] ENDOSCOPY;  Service:               Endoscopy;  Laterality: N/A; No date: ESOPHAGOGASTRODUODENOSCOPY     Comment:  x7 06/06/2017: ESOPHAGOGASTRODUODENOSCOPY (EGD) WITH PROPOFOL; N/A     Comment:  Procedure: ESOPHAGOGASTRODUODENOSCOPY (EGD) WITH               PROPOFOL;  Surgeon: Scot Jun, MD;  Location:  [facility] ENDOSCOPY;  Service: Endoscopy;  Laterality: N/A; 11/27/2019: ESOPHAGOGASTRODUODENOSCOPY (EGD) WITH PROPOFOL; N/A     Comment:  Procedure: ESOPHAGOGASTRODUODENOSCOPY (EGD) WITH               PROPOFOL;  Surgeon: Toledo, Boykin Nearing, MD;  Location:               [facility] ENDOSCOPY;  Service: Gastroenterology;  Laterality:               N/A; No date: HEMORRHOID SURGERY No date: TONSILLECTOMY  BMI    Body Mass Index: 28.89 kg/m      Reproductive/Obstetrics negative OB ROS                             Anesthesia Physical Anesthesia Plan  ASA: 2  Anesthesia Plan: General   Post-op Pain Management:    Induction: Intravenous  PONV Risk Score and Plan: Propofol infusion and TIVA  Airway Management Planned: Natural Airway and Nasal Cannula  Additional Equipment:   Intra-op Plan:   Post-operative Plan:   Informed Consent: I have reviewed the patients History and Physical, chart, labs and discussed the procedure including the risks, benefits and alternatives for the proposed anesthesia with the patient or  authorized representative who has indicated his/her understanding and acceptance.     Dental Advisory Given  Plan Discussed with: Anesthesiologist, CRNA and Surgeon  Anesthesia Plan Comments: (Patient consented for risks of anesthesia including but not limited to:  - adverse reactions to medications - risk of airway placement if required - damage to eyes, teeth, lips or other oral mucosa - nerve damage due to positioning  - sore throat or hoarseness - Damage to heart, brain, nerves, lungs, other parts of body or loss of life  Patient voiced understanding.)       Anesthesia Quick Evaluation

## 2023-01-16 NOTE — H&P (Signed)
Outpatient short stay form Pre-procedure 01/16/2023  Regis Bill, MD  Primary Physician: Lynnea Ferrier, MD  Reason for visit:  Barrett's esophagus/Surveillance colonoscopy  History of present illness:    77 y/o gentleman with history of hypertension, HLD, and hypertension here for Egd for history of BE without dysplasia and surveillance colonoscopy. Last colonoscopy in 2018 with diverticulosis. No blood thinners. No family history of GI malignancies. No significant abdominal surgeries.    Current Facility-Administered Medications:    0.9 %  sodium chloride infusion, , Intravenous, Continuous, Assata Juncaj, Rossie Muskrat, MD, Last Rate: 20 mL/hr at 01/16/23 0928, New Bag at 01/16/23 0928  Medications Prior to Admission  Medication Sig Dispense Refill Last Dose   amLODipine (NORVASC) 10 MG tablet Take 10 mg daily by mouth.   01/15/2023   aspirin EC 81 MG tablet Take 81 mg daily by mouth.   Past Week   atorvastatin (LIPITOR) 20 MG tablet Take 20 mg by mouth daily.  11 01/15/2023   celecoxib (CELEBREX) 200 MG capsule TAKE 1 CAPSULE BY MOUTH DAILY AS NEEDED FOR PAIN  11 01/15/2023   folic acid (FOLVITE) 1 MG tablet Take 1 mg daily by mouth.   Past Week   gabapentin (NEURONTIN) 300 MG capsule Take 300 mg 3 (three) times daily by mouth.   01/15/2023   hydrochlorothiazide (HYDRODIURIL) 12.5 MG tablet Take 12.5 mg daily by mouth.   01/15/2023   losartan (COZAAR) 100 MG tablet Take 100 mg daily by mouth.   01/15/2023   omega-3 acid ethyl esters (LOVAZA) 1 g capsule Take 2 capsules by mouth daily.  3 Past Week   pantoprazole (PROTONIX) 40 MG tablet Take 40 mg daily by mouth.   01/15/2023   vitamin B-12 (CYANOCOBALAMIN) 1000 MCG tablet Take by mouth.   Past Week   vitamin C (ASCORBIC ACID) 500 MG tablet Take by mouth.   Past Week   acetaminophen (TYLENOL) 325 MG tablet Take 1,300 mg 2 (two) times daily as needed by mouth.      ALPHA LIPOIC ACID PO Take by mouth daily.      Cholecalciferol 1000 units  capsule Take 1,000 Units daily by mouth.      clobetasol (TEMOVATE) 0.05 % GEL Apply topically.      clotrimazole (MYCELEX) 10 MG troche Take by mouth.      cyclobenzaprine (FLEXERIL) 5 MG tablet Take 1 tablet (5 mg total) by mouth 3 (three) times daily as needed. 20 tablet 0    diphenoxylate-atropine (LOMOTIL) 2.5-0.025 MG tablet Take by mouth.      fluorouracil (EFUDEX) 5 % cream Apply to scalp twice daily for 4 weeks      Lactobacillus Acid-Pectin (ACIDOPHILUS/PECTIN) CAPS Take by mouth.      selenium sulfide (SELSUN) 2.5 % shampoo Apply topically.      sildenafil (VIAGRA) 100 MG tablet 1 tab 1 hour prior to intercourse      tamsulosin (FLOMAX) 0.4 MG CAPS capsule Take 1 capsule (0.4 mg total) by mouth daily. 30 capsule 6    triamcinolone ointment (KENALOG) 0.1 % Apply twice a day to affected areas on legs        Allergies  Allergen Reactions   Other Swelling    Pneumonia vaccine-swollen/painful/fever chills     Past Medical History:  Diagnosis Date   Allergic rhinitis    Arthritis    knuckles   Avascular necrosis of bones of both hips (HCC)    B12 deficiency    Barrett's esophagus  without dysplasia    Bilateral renal cysts    Cancer (HCC)    skin   Enlarged prostate    GERD (gastroesophageal reflux disease)    Hypertension    LBBB (left bundle branch block)    Leukopenia    Peripheral neuropathy    feet   Sleep apnea    CPAP   Substance abuse (HCC)     Review of systems:  Otherwise negative.    Physical Exam  Gen: Alert, oriented. Appears stated age.  HEENT: PERRLA. Lungs: No respiratory distress CV: RRR Abd: soft, benign, no masses Ext: No edema    Planned procedures: Proceed with EGD/colonoscopy. The patient understands the nature of the planned procedure, indications, risks, alternatives and potential complications including but not limited to bleeding, infection, perforation, damage to internal organs and possible oversedation/side effects from  anesthesia. The patient agrees and gives consent to proceed.  Please refer to procedure notes for findings, recommendations and patient disposition/instructions.     Regis Bill, MD [facility] Gastroenterology

## 2023-01-16 NOTE — Op Note (Signed)
[facility] Gastroenterology Patient Name: Harry Patterson Procedure Date: 01/16/2023 9:38 AM MRN: [id number] Account #: [id number] Date of Birth: 20-Dec-1945 Admit Type: Outpatient Age: 77 Room: [facility] ENDO ROOM 1 Gender: Male Note Status: Finalized Instrument Name: Upper Endoscope [id number] Procedure:             Upper GI endoscopy Indications:           Barrett's esophagus Providers:             Eather Colas MD, MD Referring MD:          Eather Colas MD, MD (Referring MD), Daniel Nones, MD                         (Referring MD) Medicines:             Monitored Anesthesia Care Complications:         No immediate complications. Estimated blood loss:                         Minimal. Procedure:             Pre-Anesthesia Assessment:                        - Prior to the procedure, a History and Physical was                         performed, and patient medications and allergies were                         reviewed. The patient is competent. The risks and                         benefits of the procedure and the sedation options and                         risks were discussed with the patient. All questions                         were answered and informed consent was obtained.                         Patient identification and proposed procedure were                         verified by the physician, the nurse, the                         anesthesiologist, the anesthetist and the technician                         in the endoscopy suite. Mental Status Examination:                         alert and oriented. Airway Examination: normal                         oropharyngeal airway and neck mobility. Respiratory  Examination: clear to auscultation. CV Examination:                         normal. Prophylactic Antibiotics: The patient does not                         require prophylactic antibiotics. Prior                          Anticoagulants: The patient has taken no anticoagulant                         or antiplatelet agents. ASA Grade Assessment: II - A                         patient with mild systemic disease. After reviewing                         the risks and benefits, the patient was deemed in                         satisfactory condition to undergo the procedure. The                         anesthesia plan was to use monitored anesthesia care                         (MAC). Immediately prior to administration of                         medications, the patient was re-assessed for adequacy                         to receive sedatives. The heart rate, respiratory                         rate, oxygen saturations, blood pressure, adequacy of                         pulmonary ventilation, and response to care were                         monitored throughout the procedure. The physical                         status of the patient was re-assessed after the                         procedure.                        After obtaining informed consent, the endoscope was                         passed under direct vision. Throughout the procedure,                         the patient's blood pressure, pulse, and oxygen  saturations were monitored continuously. The Endoscope                         was introduced through the mouth, and advanced to the                         second part of duodenum. The upper GI endoscopy was                         somewhat difficult due to narrowing at the                         pharynx/upper esophageal sphincter. The patient                         tolerated the procedure well. Findings:      There was a narrowing right at the entrance to the esophagus (before       entering esophagus) which made intubation of the esophagus difficult.      There were esophageal mucosal changes classified as Barrett's stage       C2-M3 per Prague criteria present in the  distal esophagus. The maximum       longitudinal extent of these mucosal changes was 3 cm in length. Mucosa       was biopsied with a cold forceps for histology in 4 quadrants at       intervals of 2 cm in the lower third of the esophagus. A total of 2       specimen bottles were sent to pathology. Estimated blood loss was       minimal.      The entire examined stomach was normal.      The examined duodenum was normal. Impression:            - Esophageal mucosal changes classified as Barrett's                         stage C2-M3 per Prague criteria. Biopsied.                        - Normal stomach.                        - Normal examined duodenum. Recommendation:        - Discharge patient to home.                        - Resume previous diet.                        - Continue present medications.                        - Await pathology results.                        - Return to referring physician as previously                         scheduled. Procedure Code(s):     --- Professional ---  16109, Esophagogastroduodenoscopy, flexible,                         transoral; with biopsy, single or multiple Diagnosis Code(s):     --- Professional ---                        K22.70, Barrett's esophagus without dysplasia CPT copyright 2022 American Medical Association. All rights reserved. The codes documented in this report are preliminary and upon coder review may  be revised to meet current compliance requirements. Eather Colas MD, MD 01/16/2023 10:23:27 AM Number of Addenda: 0 Note Initiated On: 01/16/2023 9:38 AM Estimated Blood Loss:  Estimated blood loss was minimal.      [facility]

## 2023-01-16 NOTE — Op Note (Signed)
[facility] Gastroenterology Patient Name: Harry Patterson Procedure Date: 01/16/2023 9:38 AM MRN: [id number] Account #: [id number] Date of Birth: July 24, 1946 Admit Type: Outpatient Age: 77 Room: [facility] ENDO ROOM 1 Gender: Male Note Status: Finalized Instrument Name: Prentice Docker [id number] Procedure:             Colonoscopy Indications:           Surveillance: Personal history of adenomatous polyps                         on last colonoscopy > 5 years ago Providers:             Eather Colas MD, MD Referring MD:          Eather Colas MD, MD (Referring MD), Daniel Nones, MD                         (Referring MD) Medicines:             Monitored Anesthesia Care Complications:         No immediate complications. Procedure:             Pre-Anesthesia Assessment:                        - Prior to the procedure, a History and Physical was                         performed, and patient medications and allergies were                         reviewed. The patient is competent. The risks and                         benefits of the procedure and the sedation options and                         risks were discussed with the patient. All questions                         were answered and informed consent was obtained.                         Patient identification and proposed procedure were                         verified by the physician, the nurse, the                         anesthesiologist, the anesthetist and the technician                         in the endoscopy suite. Mental Status Examination:                         alert and oriented. Airway Examination: normal                         oropharyngeal airway and neck mobility. Respiratory  Examination: clear to auscultation. CV Examination:                         normal. Prophylactic Antibiotics: The patient does not                         require prophylactic antibiotics. Prior                          Anticoagulants: The patient has taken no anticoagulant                         or antiplatelet agents. ASA Grade Assessment: II - A                         patient with mild systemic disease. After reviewing                         the risks and benefits, the patient was deemed in                         satisfactory condition to undergo the procedure. The                         anesthesia plan was to use monitored anesthesia care                         (MAC). Immediately prior to administration of                         medications, the patient was re-assessed for adequacy                         to receive sedatives. The heart rate, respiratory                         rate, oxygen saturations, blood pressure, adequacy of                         pulmonary ventilation, and response to care were                         monitored throughout the procedure. The physical                         status of the patient was re-assessed after the                         procedure.                        After obtaining informed consent, the colonoscope was                         passed under direct vision. Throughout the procedure,                         the patient's blood pressure, pulse, and oxygen  saturations were monitored continuously. The                         Colonoscope was introduced through the anus and                         advanced to the the cecum, identified by appendiceal                         orifice and ileocecal valve. The colonoscopy was                         somewhat difficult due to significant looping.                         Successful completion of the procedure was aided by                         applying abdominal pressure. The patient tolerated the                         procedure well. The quality of the bowel preparation                         was good. The ileocecal valve, appendiceal orifice,                          and rectum were photographed. Findings:      The perianal and digital rectal examinations were normal.      A few small-mouthed diverticula were found in the sigmoid colon.      Internal hemorrhoids were found during retroflexion. The hemorrhoids       were Grade I (internal hemorrhoids that do not prolapse).      The exam was otherwise without abnormality on direct and retroflexion       views. Impression:            - Diverticulosis in the sigmoid colon.                        - Internal hemorrhoids.                        - The examination was otherwise normal on direct and                         retroflexion views.                        - No specimens collected. Recommendation:        - Discharge patient to home.                        - Resume previous diet.                        - Continue present medications.                        - Repeat colonoscopy is not recommended due to current  age (51 years or older) for surveillance.                        - Return to referring physician as previously                         scheduled. Procedure Code(s):     --- Professional ---                        Z6109, Colorectal cancer screening; colonoscopy on                         individual at high risk Diagnosis Code(s):     --- Professional ---                        Z86.010, Personal history of colonic polyps                        K64.0, First degree hemorrhoids                        K57.30, Diverticulosis of large intestine without                         perforation or abscess without bleeding CPT copyright 2022 American Medical Association. All rights reserved. The codes documented in this report are preliminary and upon coder review may  be revised to meet current compliance requirements. Eather Colas MD, MD 01/16/2023 10:27:14 AM Number of Addenda: 0 Note Initiated On: 01/16/2023 9:38 AM Scope Withdrawal Time: 0 hours 5 minutes 17 seconds  Total  Procedure Duration: 0 hours 13 minutes 47 seconds  Estimated Blood Loss:  Estimated blood loss: none.      [facility]

## 2023-01-16 NOTE — Anesthesia Postprocedure Evaluation (Signed)
Anesthesia Post Note  Patient: Harry Patterson.  Procedure(s) Performed: COLONOSCOPY WITH PROPOFOL ESOPHAGOGASTRODUODENOSCOPY (EGD) WITH PROPOFOL  Patient location during evaluation: Endoscopy Anesthesia Type: General Level of consciousness: awake and alert Pain management: pain level controlled Vital Signs Assessment: post-procedure vital signs reviewed and stable Respiratory status: spontaneous breathing, nonlabored ventilation, respiratory function stable and patient connected to nasal cannula oxygen Cardiovascular status: blood pressure returned to baseline and stable Postop Assessment: no apparent nausea or vomiting Anesthetic complications: no   No notable events documented.   Last Vitals:  Vitals:   01/16/23 1012 01/16/23 1021  BP: 105/63 (!) 144/71  Pulse: 79 87  Resp: 17 (!) 21  Temp:    SpO2: 97% 95%    Last Pain:  Vitals:   01/16/23 1021  TempSrc:   PainSc: 0-No pain                 Cleda Mccreedy Jermani Eberlein

## 2023-01-17 ENCOUNTER — Encounter: Payer: Self-pay | Admitting: Gastroenterology

## 2023-01-18 ENCOUNTER — Encounter: Payer: Self-pay | Admitting: Gastroenterology

## 2023-04-04 ENCOUNTER — Other Ambulatory Visit: Payer: Self-pay | Admitting: Otolaryngology

## 2023-04-04 DIAGNOSIS — R1310 Dysphagia, unspecified: Secondary | ICD-10-CM

## 2023-04-04 DIAGNOSIS — Z8719 Personal history of other diseases of the digestive system: Secondary | ICD-10-CM

## 2023-04-04 DIAGNOSIS — K219 Gastro-esophageal reflux disease without esophagitis: Secondary | ICD-10-CM

## 2023-04-04 DIAGNOSIS — K117 Disturbances of salivary secretion: Secondary | ICD-10-CM

## 2023-04-13 ENCOUNTER — Other Ambulatory Visit: Payer: Self-pay | Admitting: Otolaryngology

## 2023-04-13 ENCOUNTER — Ambulatory Visit
Admission: RE | Admit: 2023-04-13 | Discharge: 2023-04-13 | Disposition: A | Discharge status: Home / Self Care | Payer: BC Managed Care – PPO | Source: Ambulatory Visit | Attending: Otolaryngology | Admitting: Otolaryngology

## 2023-04-13 DIAGNOSIS — R1310 Dysphagia, unspecified: Secondary | ICD-10-CM | POA: Insufficient documentation

## 2023-04-13 DIAGNOSIS — K219 Gastro-esophageal reflux disease without esophagitis: Secondary | ICD-10-CM | POA: Insufficient documentation

## 2023-04-13 DIAGNOSIS — Z8719 Personal history of other diseases of the digestive system: Secondary | ICD-10-CM | POA: Diagnosis present

## 2023-04-13 DIAGNOSIS — K117 Disturbances of salivary secretion: Secondary | ICD-10-CM | POA: Insufficient documentation

## 2023-04-13 DIAGNOSIS — R1314 Dysphagia, pharyngoesophageal phase: Secondary | ICD-10-CM

## 2023-04-13 NOTE — Therapy (Signed)
Modified Barium Swallow Study  Patient Details  Name: Lantz Gasper. MRN: [id number] Date of Birth: 03/17/1946  Today's Date: 04/13/2023  Modified Barium Swallow completed.  Full report located under Chart Review in the Imaging Section.  History of Present Illness Pt is a 77 year old male who was referred by his ENT Roney Mans Vaught) for what is described as "difficulty swallowing and choking." Pt with history of Barrett's esophagus.   Clinical Impression Pt presents with adequate oropharyngeal abilities when consuming thin liquids, puree, graham cracker with barium paste as well as whole barium tablet with thin liquids via cup. Radiologist was not present during this study to confirm but would recommend a barium swallow assess for any esophageal issues for possible presence of diverticulum that might be trapping boluses under the UES. At this time, education has been provided to pt and video review provided. No further ST services are indicated. Factors that may increase risk of adverse event in presence of aspiration Rubye Oaks & Clearance Coots 2021):  potential diverticulum  Swallow Evaluation Recommendations Recommendations: PO diet PO Diet Recommendation: Regular;Thin liquids (Level 0) Liquid Administration via: Cup Medication Administration: Whole meds with liquid Supervision: Patient able to self-feed Swallowing strategies  : Minimize environmental distractions;Slow rate;Small bites/sips Postural changes: Position pt fully upright for meals;Stay upright 30-60 min after meals Oral care recommendations: Oral care BID (2x/day) Recommended consults: Consider GI consultation;Consider ENT consultation   Gatha Mcnulty B. Dreama Saa, M.S., CCC-SLP, Tree surgeon Certified Brain Injury Specialist [facility]  [facility] Services Office [phone] Ascom [phone] Fax [phone]

## 2023-04-16 ENCOUNTER — Ambulatory Visit
Admission: RE | Admit: 2023-04-16 | Discharge: 2023-04-16 | Disposition: A | Discharge status: Home / Self Care | Payer: BC Managed Care – PPO | Source: Ambulatory Visit | Attending: Otolaryngology | Admitting: Otolaryngology

## 2023-04-16 DIAGNOSIS — R1314 Dysphagia, pharyngoesophageal phase: Secondary | ICD-10-CM

## 2023-04-20 ENCOUNTER — Encounter: Payer: Self-pay | Admitting: Urology

## 2023-04-20 ENCOUNTER — Ambulatory Visit (INDEPENDENT_AMBULATORY_CARE_PROVIDER_SITE_OTHER): Payer: BC Managed Care – PPO | Admitting: Urology

## 2023-04-20 VITALS — BP 137/70 | HR 74 | Ht 70.0 in | Wt 200.0 lb

## 2023-04-20 DIAGNOSIS — R3912 Poor urinary stream: Secondary | ICD-10-CM

## 2023-04-20 DIAGNOSIS — N400 Enlarged prostate without lower urinary tract symptoms: Secondary | ICD-10-CM

## 2023-04-20 LAB — URINALYSIS, COMPLETE
Bilirubin, UA: NEGATIVE
Glucose, UA: NEGATIVE
Ketones, UA: NEGATIVE
Leukocytes,UA: NEGATIVE
Nitrite, UA: NEGATIVE
Protein,UA: NEGATIVE
RBC, UA: NEGATIVE
Specific Gravity, UA: 1.015 (ref 1.005–1.030)
Urobilinogen, Ur: 0.2 mg/dL (ref 0.2–1.0)
pH, UA: 6 (ref 5.0–7.5)

## 2023-04-20 LAB — MICROSCOPIC EXAMINATION: Epithelial Cells (non renal): >10 /hpf — AB (ref 0–10)

## 2023-04-20 LAB — BLADDER SCAN AMB NON-IMAGING: Scan Result: 87

## 2023-04-20 NOTE — Progress Notes (Signed)
I, Maysun Anabel Bene, acting as a scribe for Riki Altes, MD., have documented all relevant documentation on the behalf of Riki Altes, MD, as directed by Riki Altes, MD while in the presence of Riki Altes, MD.  04/20/2023 3:05 PM   Harry Patterson. 07/19/1946 [id number]  Referring provider: Lynnea Ferrier, MD [address] [facility] [place],  [place] [address]  Chief Complaint  Patient presents with   Urinary Retention   Urologic history: 1.  Chronic nonbacterial prostatitis Tamsulosin prn symptom flare   2.  BPH No bothersome LUTS  HPI: Harry Sowells. is a 77 y.o. male presents for bothersome lower urinary tract symptoms.  History of chronic non-bacterial prostatitis and BPH.  Last seen November 2023.  6-8 weeks ago, he complained of mild dysuria and weak urinary stream. He started tamsulosin, which he typically takes for prostatitis flares, and states the dysuria resolved, but he continues with a slow urinary stream.  IPPS says today 9/35 with weak stream as his most bothersome symptom.  No gross hematuria or flank/abdominal/pelvic pain.    PMH: Past Medical History:  Diagnosis Date   Allergic rhinitis    Arthritis    knuckles   Avascular necrosis of bones of both hips (HCC)    B12 deficiency    Barrett's esophagus without dysplasia    Bilateral renal cysts    Cancer (HCC)    skin   Enlarged prostate    GERD (gastroesophageal reflux disease)    Hypertension    LBBB (left bundle branch block)    Leukopenia    Peripheral neuropathy    feet   Sleep apnea    CPAP   Substance abuse (HCC)     Surgical History: Past Surgical History:  Procedure Laterality Date   APPENDECTOMY     BIOPSY  01/16/2023   Procedure: BIOPSY;  Surgeon: Regis Bill, MD;  Location: [facility] ENDOSCOPY;  Service: Endoscopy;;   CATARACT EXTRACTION W/PHACO Right 06/11/2019   Procedure: CATARACT EXTRACTION PHACO AND INTRAOCULAR LENS  PLACEMENT (IOC) RIGHT 00:54.6     22.0%    12.00;  Surgeon: Lockie Mola, MD;  Location: [facility];  Service: Ophthalmology;  Laterality: Right;  sleep apnea   CATARACT EXTRACTION W/PHACO Left 06/30/2019   Procedure: CATARACT EXTRACTION PHACO AND INTRAOCULAR LENS PLACEMENT (IOC) LEFT TORIC LENS 9.97  01:09.1  14.4%;  Surgeon: Lockie Mola, MD;  Location: [facility];  Service: Ophthalmology;  Laterality: Left;   COLONOSCOPY W/ POLYPECTOMY     02/08/00,11'12'04,05/05/05, 08/21/2008, 12/05/2013, 05/08/2014   COLONOSCOPY WITH PROPOFOL N/A 06/06/2017   Procedure: COLONOSCOPY WITH PROPOFOL;  Surgeon: Scot Jun, MD;  Location: [facility] ENDOSCOPY;  Service: Endoscopy;  Laterality: N/A;   COLONOSCOPY WITH PROPOFOL N/A 01/16/2023   Procedure: COLONOSCOPY WITH PROPOFOL;  Surgeon: Regis Bill, MD;  Location: [facility] ENDOSCOPY;  Service: Endoscopy;  Laterality: N/A;   ESOPHAGOGASTRODUODENOSCOPY     x7   ESOPHAGOGASTRODUODENOSCOPY (EGD) WITH PROPOFOL N/A 06/06/2017   Procedure: ESOPHAGOGASTRODUODENOSCOPY (EGD) WITH PROPOFOL;  Surgeon: Scot Jun, MD;  Location: [facility] ENDOSCOPY;  Service: Endoscopy;  Laterality: N/A;   ESOPHAGOGASTRODUODENOSCOPY (EGD) WITH PROPOFOL N/A 11/27/2019   Procedure: ESOPHAGOGASTRODUODENOSCOPY (EGD) WITH PROPOFOL;  Surgeon: Toledo, Boykin Nearing, MD;  Location: [facility] ENDOSCOPY;  Service: Gastroenterology;  Laterality: N/A;   ESOPHAGOGASTRODUODENOSCOPY (EGD) WITH PROPOFOL N/A 01/16/2023   Procedure: ESOPHAGOGASTRODUODENOSCOPY (EGD) WITH PROPOFOL;  Surgeon: Regis Bill, MD;  Location: [facility] ENDOSCOPY;  Service: Endoscopy;  Laterality: N/A;   HEMORRHOID SURGERY     TONSILLECTOMY      Home Medications:  Allergies as of 04/20/2023       Reactions   Amoxicillin Other (See Comments)   Oral thrush   Other Swelling   Pneumonia vaccine-swollen/painful/fever chills   Amlodipine Other (See Comments)   EDEMA        Medication List         Accurate as of April 20, 2023  3:05 PM. If you have any questions, ask your nurse or doctor.          STOP taking these medications    amLODipine 10 MG tablet Commonly known as: NORVASC Stopped by: Riki Altes       TAKE these medications    acetaminophen 325 MG tablet Commonly known as: TYLENOL Take 1,300 mg 2 (two) times daily as needed by mouth.   Acidophilus/Pectin Caps Take by mouth.   ALPHA LIPOIC ACID PO Take by mouth daily.   ascorbic acid 500 MG tablet Commonly known as: VITAMIN C Take by mouth.   aspirin EC 81 MG tablet Take 81 mg daily by mouth.   atorvastatin 20 MG tablet Commonly known as: LIPITOR Take 20 mg by mouth daily.   celecoxib 200 MG capsule Commonly known as: CELEBREX TAKE 1 CAPSULE BY MOUTH DAILY AS NEEDED FOR PAIN   Cholecalciferol 25 MCG (1000 UT) capsule Take 1,000 Units daily by mouth.   clobetasol 0.05 % Gel Commonly known as: TEMOVATE Apply topically.   clotrimazole 10 MG troche Commonly known as: MYCELEX Take by mouth.   cyanocobalamin 1000 MCG tablet Commonly known as: VITAMIN B12 Take by mouth.   cyclobenzaprine 5 MG tablet Commonly known as: FLEXERIL Take 1 tablet (5 mg total) by mouth 3 (three) times daily as needed.   diphenoxylate-atropine 2.5-0.025 MG tablet Commonly known as: LOMOTIL Take by mouth.   fluorouracil 5 % cream Commonly known as: EFUDEX Apply to scalp twice daily for 4 weeks   folic acid 1 MG tablet Commonly known as: FOLVITE Take 1 mg daily by mouth.   gabapentin 300 MG capsule Commonly known as: NEURONTIN Take 300 mg 3 (three) times daily by mouth.   hydrochlorothiazide 12.5 MG tablet Commonly known as: HYDRODIURIL Take 12.5 mg daily by mouth.   losartan 100 MG tablet Commonly known as: COZAAR Take 100 mg daily by mouth.   metoprolol succinate 50 MG 24 hr tablet Commonly known as: TOPROL-XL Take 50 mg by mouth daily.   omega-3 acid ethyl esters 1 g capsule Commonly  known as: LOVAZA Take 2 capsules by mouth daily.   pantoprazole 40 MG tablet Commonly known as: PROTONIX Take 40 mg daily by mouth.   selenium sulfide 2.5 % lotion Commonly known as: SELSUN Apply topically.   sildenafil 100 MG tablet Commonly known as: VIAGRA 1 tab 1 hour prior to intercourse   tamsulosin 0.4 MG Caps capsule Commonly known as: FLOMAX Take 1 capsule (0.4 mg total) by mouth daily.   triamcinolone ointment 0.1 % Commonly known as: KENALOG Apply twice a day to affected areas on legs        Allergies:  Allergies  Allergen Reactions   Amoxicillin Other (See Comments)    Oral thrush   Other Swelling    Pneumonia vaccine-swollen/painful/fever chills   Amlodipine Other (See Comments)    EDEMA    Family History: Family History  Problem Relation Age of Onset   Heart disease Mother  Social History:  reports that he has never smoked. He has never used smokeless tobacco. He reports current alcohol use of about 2.0 standard drinks of alcohol per week. He reports that he does not use drugs.   Physical Exam: BP 137/70   Pulse 74   Ht 5\' 10"  (1.778 m)   Wt 200 lb (90.7 kg)   BMI 28.70 kg/m   Constitutional:  Alert and oriented, No acute distress. HEENT: [place] AT. Respiratory: Normal respiratory effort, no increased work of breathing. GU: Prostate 35 grams, flat smooth without nodules.  Psychiatric: Normal mood and affect.   Urinalysis Dipstick/microscopy negative   Assessment & Plan:    1. Weak urinary stream PVR 87 mL We discussed potential etiologies including worsening BPH and urethral stricture disease.  Will increase tamsulosin to 0.8 mg daily x30 days; if no improvement, he will return in 1 month for a cystoscopy.   I have reviewed the above documentation for accuracy and completeness, and I agree with the above.   Riki Altes, MD  [facility] [address], [address] [place], [place] [address] [phone]

## 2023-04-22 ENCOUNTER — Encounter: Payer: Self-pay | Admitting: Urology

## 2023-05-25 ENCOUNTER — Telehealth: Payer: Self-pay | Admitting: Urology

## 2023-05-25 ENCOUNTER — Encounter: Payer: Self-pay | Admitting: Urology

## 2023-05-25 ENCOUNTER — Ambulatory Visit (INDEPENDENT_AMBULATORY_CARE_PROVIDER_SITE_OTHER): Payer: BC Managed Care – PPO | Admitting: Urology

## 2023-05-25 VITALS — BP 170/81 | HR 61 | Ht 68.0 in | Wt 192.0 lb

## 2023-05-25 DIAGNOSIS — N400 Enlarged prostate without lower urinary tract symptoms: Secondary | ICD-10-CM

## 2023-05-25 DIAGNOSIS — N35912 Unspecified bulbous urethral stricture, male: Secondary | ICD-10-CM

## 2023-05-25 LAB — URINALYSIS, COMPLETE
Bilirubin, UA: NEGATIVE
Glucose, UA: NEGATIVE
Ketones, UA: NEGATIVE
Leukocytes,UA: NEGATIVE
Nitrite, UA: NEGATIVE
RBC, UA: NEGATIVE
Specific Gravity, UA: 1.02 (ref 1.005–1.030)
Urobilinogen, Ur: 0.2 mg/dL (ref 0.2–1.0)
pH, UA: 6 (ref 5.0–7.5)

## 2023-05-25 LAB — MICROSCOPIC EXAMINATION
Bacteria, UA: NONE SEEN
RBC, Urine: NONE SEEN /[HPF] (ref 0–2)

## 2023-05-25 NOTE — Telephone Encounter (Signed)
Pt had cysto this morning.  Pt and wife have several questions that pt forgot to ask when they were here.  Please call wife (on [place]).

## 2023-05-25 NOTE — Telephone Encounter (Signed)
Talked to wife about the about the visit today .

## 2023-05-25 NOTE — Progress Notes (Unsigned)
   05/25/23  CC:  Chief Complaint  Patient presents with   Cysto    HPI: Refer to my previous office note 04/20/2023.  Blood pressure (!) 170/81, pulse 61, height 5\' 8"  (1.727 m), weight 192 lb (87.1 kg). NED. A&Ox3.   No respiratory distress   Abd soft, NT, ND Normal phallus with bilateral descended testicles  Cystoscopy Procedure Note  Patient identification was confirmed, informed consent was obtained, and patient was prepped using Betadine solution.  Lidocaine jelly was administered per urethral meatus.     Pre-Procedure: - Inspection reveals a normal caliber ureteral meatus.  Procedure: The flexible cystoscope was introduced without difficulty - No urethral strictures/lesions are present. - {Blank multiple:19197::"Enlarged","Surgically absent","Normal"} prostate *** - {Blank multiple:19197::"Normal","Elevated","Tight"} bladder neck - Bilateral ureteral orifices identified - Bladder mucosa  reveals no ulcers, tumors, or lesions - No bladder stones - No trabeculation  Retroflexion shows ***   Post-Procedure: - Patient tolerated the procedure well  Assessment/ Plan:   No follow-ups on file.  Riki Altes, MD

## 2023-05-27 ENCOUNTER — Other Ambulatory Visit: Payer: Self-pay | Admitting: Urology

## 2023-05-27 DIAGNOSIS — N35912 Unspecified bulbous urethral stricture, male: Secondary | ICD-10-CM

## 2023-05-27 NOTE — Progress Notes (Signed)
05/25/2023 9:31 PM   Harry Patterson. May 08, 1946 [id number]  Referring provider: Lynnea Ferrier, MD [address] [facility] [place],  [place] [address]  Chief Complaint  Patient presents with   Cysto    HPI: Harry Patterson. is a 77 y.o. 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  He is scheduled for cystoscopy with balloon dilation in same-day surgery under anesthesia/sedation.   PMH: Past Medical History:  Diagnosis Date   Allergic rhinitis    Arthritis    knuckles   Avascular necrosis of bones of both hips (HCC)    B12 deficiency    Barrett's esophagus without dysplasia    Bilateral renal cysts    Cancer (HCC)    skin   Enlarged prostate    GERD (gastroesophageal reflux disease)    Hypertension    LBBB (left bundle branch block)    Leukopenia    Peripheral neuropathy    feet   Sleep apnea    CPAP   Substance abuse (HCC)     Surgical History: Past Surgical History:  Procedure Laterality Date   APPENDECTOMY     BIOPSY  01/16/2023   Procedure: BIOPSY;  Surgeon: Regis Bill, MD;  Location: [facility] ENDOSCOPY;  Service: Endoscopy;;   CATARACT EXTRACTION W/PHACO Right 06/11/2019   Procedure: CATARACT EXTRACTION PHACO AND INTRAOCULAR LENS PLACEMENT (IOC) RIGHT 00:54.6     22.0%    12.00;  Surgeon: Lockie Mola, MD;  Location: [facility];  Service: Ophthalmology;  Laterality: Right;  sleep apnea   CATARACT EXTRACTION W/PHACO Left 06/30/2019   Procedure: CATARACT EXTRACTION PHACO AND INTRAOCULAR LENS PLACEMENT (IOC) LEFT TORIC LENS 9.97  01:09.1  14.4%;  Surgeon: Lockie Mola, MD;  Location: [facility];  Service: Ophthalmology;  Laterality: Left;   COLONOSCOPY W/ POLYPECTOMY     02/08/00,11'12'04,05/05/05, 08/21/2008, 12/05/2013, 05/08/2014   COLONOSCOPY WITH PROPOFOL N/A 06/06/2017   Procedure: COLONOSCOPY WITH PROPOFOL;  Surgeon:  Scot Jun, MD;  Location: [facility];  Service: Endoscopy;  Laterality: N/A;   COLONOSCOPY WITH PROPOFOL N/A 01/16/2023   Procedure: COLONOSCOPY WITH PROPOFOL;  Surgeon: Regis Bill, MD;  Location: [facility] END[facility];  Service: Endoscopy;  Laterality: N/A;   ESOPHAGOGASTRODUODENOSCOPY     x7   ESOPHAGOGASTRODUODENOSCOPY (EGD) WITH PROPOFOL N/A 06/06/2017   Procedure: ESOPHAGOGASTRODUODENOSCOPY (EGD) WITH PROPOFOL;  Surgeon: Scot Jun, MD;  Location: [facility];  Service: Endoscopy;  Laterality: N/A;   ESOPHAGOGASTRODUODENOSCOPY (EGD) WITH PROPOFOL N/A 11/27/2019   Procedure: ESOPHAGOGASTRODUODENOSCOPY (EGD) WITH PROPOFOL;  Surgeon: Toledo, Boykin Nearing, MD;  Location: [facility] ENDOSCOPY;  Service: Gastroenterology;  Laterality: N/A;   ESOPHAGOGASTRODUODENOSCOPY (EGD) WITH PROPOFOL N/A 01/16/2023   Procedure: ESOPHAGOGASTRODUODENOSCOPY (EGD) WITH PROPOFOL;  Surgeon: Regis Bill, MD;  Location: [facility] ENDOSCOPY;  Service: Endoscopy;  Laterality: N/A;   HEMORRHOID SURGERY     TONSILLECTOMY      Home Medications:  Allergies as of 05/25/2023       Reactions   Amoxicillin Other (See Comments)   Oral thrush   Other Swelling   Pneumonia vaccine-swollen/painful/fever chills   Amlodipine Other (See Comments)   EDEMA        Medication List        Accurate as of May 25, 2023 11:59 PM. If you have any questions, ask your nurse or doctor.          acetaminophen 325 MG tablet Commonly known as:  TYLENOL Take 1,300 mg 2 (two) times daily as needed by mouth.   Acidophilus/Pectin Caps Take by mouth.   ALPHA LIPOIC ACID PO Take by mouth daily.   ascorbic acid 500 MG tablet Commonly known as: VITAMIN C Take by mouth.   aspirin EC 81 MG tablet Take 81 mg daily by mouth.   atorvastatin 20 MG tablet Commonly known as: LIPITOR Take 20 mg by mouth daily.   celecoxib 200 MG capsule Commonly known as: CELEBREX TAKE 1 CAPSULE BY MOUTH DAILY AS NEEDED FOR  PAIN   Cholecalciferol 25 MCG (1000 UT) capsule Take 1,000 Units daily by mouth.   clobetasol 0.05 % Gel Commonly known as: TEMOVATE Apply topically.   clotrimazole 10 MG troche Commonly known as: MYCELEX Take by mouth.   cyanocobalamin 1000 MCG tablet Commonly known as: VITAMIN B12 Take by mouth.   cyclobenzaprine 5 MG tablet Commonly known as: FLEXERIL Take 1 tablet (5 mg total) by mouth 3 (three) times daily as needed.   diphenoxylate-atropine 2.5-0.025 MG tablet Commonly known as: LOMOTIL Take by mouth.   fluorouracil 5 % cream Commonly known as: EFUDEX Apply to scalp twice daily for 4 weeks   folic acid 1 MG tablet Commonly known as: FOLVITE Take 1 mg daily by mouth.   gabapentin 300 MG capsule Commonly known as: NEURONTIN Take 300 mg 3 (three) times daily by mouth.   hydrochlorothiazide 12.5 MG tablet Commonly known as: HYDRODIURIL Take 12.5 mg daily by mouth.   losartan 100 MG tablet Commonly known as: COZAAR Take 100 mg daily by mouth.   metoprolol succinate 50 MG 24 hr tablet Commonly known as: TOPROL-XL Take 50 mg by mouth daily.   omega-3 acid ethyl esters 1 g capsule Commonly known as: LOVAZA Take 2 capsules by mouth daily.   pantoprazole 40 MG tablet Commonly known as: PROTONIX Take 40 mg daily by mouth.   selenium sulfide 2.5 % lotion Commonly known as: SELSUN Apply topically.   sildenafil 100 MG tablet Commonly known as: VIAGRA 1 tab 1 hour prior to intercourse   tamsulosin 0.4 MG Caps capsule Commonly known as: FLOMAX Take 1 capsule (0.4 mg total) by mouth daily.   triamcinolone ointment 0.1 % Commonly known as: KENALOG Apply twice a day to affected areas on legs        Allergies:  Allergies  Allergen Reactions   Amoxicillin Other (See Comments)    Oral thrush   Other Swelling    Pneumonia vaccine-swollen/painful/fever chills   Amlodipine Other (See Comments)    EDEMA    Family History: Family History  Problem  Relation Age of Onset   Heart disease Mother     Social History:  reports that he has never smoked. He has never used smokeless tobacco. He reports current alcohol use of about 2.0 standard drinks of alcohol per week. He reports that he does not use drugs.   Physical Exam: BP (!) 170/81   Pulse 61   Ht 5\' 8"  (1.727 m)   Wt 192 lb (87.1 kg)   BMI 29.19 kg/m   Constitutional:  Alert and oriented, No acute distress. HEENT: [place] AT Respiratory: Normal respiratory effort, no increased work of breathing. GI: Abdomen is soft, nontender, nondistended, no abdominal masses Psychiatric: Normal mood and affect.  Laboratory Data:  Urinalysis Dipstick/microscopy negative   Assessment & Plan:    1.  Bulbar urethral stricture Scheduled for cystoscopy with balloon dilation of urethral stricture The procedure was discussed in detail as outlined  in today's cystoscopy note. All questions were answered and he desires to proceed.   Riki Altes, MD  [facility] [address] [place], [place] [address] [phone]

## 2023-05-27 NOTE — H&P (View-Only) (Signed)
 05/25/2023 9:31 PM   Harry Patterson. May 08, 1946 [id number]  Referring provider: Lynnea Ferrier, MD [address] [facility] [place],  [place] [address]  Chief Complaint  Patient presents with   Cysto    HPI: Harry Patterson. is a 77 y.o. 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  He is scheduled for cystoscopy with balloon dilation in same-day surgery under anesthesia/sedation.   PMH: Past Medical History:  Diagnosis Date   Allergic rhinitis    Arthritis    knuckles   Avascular necrosis of bones of both hips (HCC)    B12 deficiency    Barrett's esophagus without dysplasia    Bilateral renal cysts    Cancer (HCC)    skin   Enlarged prostate    GERD (gastroesophageal reflux disease)    Hypertension    LBBB (left bundle branch block)    Leukopenia    Peripheral neuropathy    feet   Sleep apnea    CPAP   Substance abuse (HCC)     Surgical History: Past Surgical History:  Procedure Laterality Date   APPENDECTOMY     BIOPSY  01/16/2023   Procedure: BIOPSY;  Surgeon: Regis Bill, MD;  Location: [facility] ENDOSCOPY;  Service: Endoscopy;;   CATARACT EXTRACTION W/PHACO Right 06/11/2019   Procedure: CATARACT EXTRACTION PHACO AND INTRAOCULAR LENS PLACEMENT (IOC) RIGHT 00:54.6     22.0%    12.00;  Surgeon: Lockie Mola, MD;  Location: [facility];  Service: Ophthalmology;  Laterality: Right;  sleep apnea   CATARACT EXTRACTION W/PHACO Left 06/30/2019   Procedure: CATARACT EXTRACTION PHACO AND INTRAOCULAR LENS PLACEMENT (IOC) LEFT TORIC LENS 9.97  01:09.1  14.4%;  Surgeon: Lockie Mola, MD;  Location: [facility];  Service: Ophthalmology;  Laterality: Left;   COLONOSCOPY W/ POLYPECTOMY     02/08/00,11'12'04,05/05/05, 08/21/2008, 12/05/2013, 05/08/2014   COLONOSCOPY WITH PROPOFOL N/A 06/06/2017   Procedure: COLONOSCOPY WITH PROPOFOL;  Surgeon:  Scot Jun, MD;  Location: [facility] END[facility]COPY;  Service: Endoscopy;  Laterality: N/A;   COLONOSCOPY WITH PROPOFOL N/A 01/16/2023   Procedure: COLONOSCOPY WITH PROPOFOL;  Surgeon: Regis Bill, MD;  Location: [facility] ENDOSCOPY;  Service: Endoscopy;  Laterality: N/A;   ESOPHAGOGASTRODUODENOSCOPY     x7   ESOPHAGOGASTRODUODENOSCOPY (EGD) WITH PROPOFOL N/A 06/06/2017   Procedure: ESOPHAGOGASTRODUODENOSCOPY (EGD) WITH PROPOFOL;  Surgeon: Scot Jun, MD;  Location: [facility];  Service: Endoscopy;  Laterality: N/A;   ESOPHAGOGASTRODUODENOSCOPY (EGD) WITH PROPOFOL N/A 11/27/2019   Procedure: ESOPHAGOGASTRODUODENOSCOPY (EGD) WITH PROPOFOL;  Surgeon: Toledo, Boykin Nearing, MD;  Location: [facility] ENDOSCOPY;  Service: Gastroenterology;  Laterality: N/A;   ESOPHAGOGASTRODUODENOSCOPY (EGD) WITH PROPOFOL N/A 01/16/2023   Procedure: ESOPHAGOGASTRODUODENOSCOPY (EGD) WITH PROPOFOL;  Surgeon: Regis Bill, MD;  Location: [facility] ENDOSCOPY;  Service: Endoscopy;  Laterality: N/A;   HEMORRHOID SURGERY     TONSILLECTOMY      Home Medications:  Allergies as of 05/25/2023       Reactions   Amoxicillin Other (See Comments)   Oral thrush   Other Swelling   Pneumonia vaccine-swollen/painful/fever chills   Amlodipine Other (See Comments)   EDEMA        Medication List        Accurate as of May 25, 2023 11:59 PM. If you have any questions, ask your nurse or doctor.          acetaminophen 325 MG tablet Commonly known as:  TYLENOL Take 1,300 mg 2 (two) times daily as needed by mouth.   Acidophilus/Pectin Caps Take by mouth.   ALPHA LIPOIC ACID PO Take by mouth daily.   ascorbic acid 500 MG tablet Commonly known as: VITAMIN C Take by mouth.   aspirin EC 81 MG tablet Take 81 mg daily by mouth.   atorvastatin 20 MG tablet Commonly known as: LIPITOR Take 20 mg by mouth daily.   celecoxib 200 MG capsule Commonly known as: CELEBREX TAKE 1 CAPSULE BY MOUTH DAILY AS NEEDED FOR  PAIN   Cholecalciferol 25 MCG (1000 UT) capsule Take 1,000 Units daily by mouth.   clobetasol 0.05 % Gel Commonly known as: TEMOVATE Apply topically.   clotrimazole 10 MG troche Commonly known as: MYCELEX Take by mouth.   cyanocobalamin 1000 MCG tablet Commonly known as: VITAMIN B12 Take by mouth.   cyclobenzaprine 5 MG tablet Commonly known as: FLEXERIL Take 1 tablet (5 mg total) by mouth 3 (three) times daily as needed.   diphenoxylate-atropine 2.5-0.025 MG tablet Commonly known as: LOMOTIL Take by mouth.   fluorouracil 5 % cream Commonly known as: EFUDEX Apply to scalp twice daily for 4 weeks   folic acid 1 MG tablet Commonly known as: FOLVITE Take 1 mg daily by mouth.   gabapentin 300 MG capsule Commonly known as: NEURONTIN Take 300 mg 3 (three) times daily by mouth.   hydrochlorothiazide 12.5 MG tablet Commonly known as: HYDRODIURIL Take 12.5 mg daily by mouth.   losartan 100 MG tablet Commonly known as: COZAAR Take 100 mg daily by mouth.   metoprolol succinate 50 MG 24 hr tablet Commonly known as: TOPROL-XL Take 50 mg by mouth daily.   omega-3 acid ethyl esters 1 g capsule Commonly known as: LOVAZA Take 2 capsules by mouth daily.   pantoprazole 40 MG tablet Commonly known as: PROTONIX Take 40 mg daily by mouth.   selenium sulfide 2.5 % lotion Commonly known as: SELSUN Apply topically.   sildenafil 100 MG tablet Commonly known as: VIAGRA 1 tab 1 hour prior to intercourse   tamsulosin 0.4 MG Caps capsule Commonly known as: FLOMAX Take 1 capsule (0.4 mg total) by mouth daily.   triamcinolone ointment 0.1 % Commonly known as: KENALOG Apply twice a day to affected areas on legs        Allergies:  Allergies  Allergen Reactions   Amoxicillin Other (See Comments)    Oral thrush   Other Swelling    Pneumonia vaccine-swollen/painful/fever chills   Amlodipine Other (See Comments)    EDEMA    Family History: Family History  Problem  Relation Age of Onset   Heart disease Mother     Social History:  reports that he has never smoked. He has never used smokeless tobacco. He reports current alcohol use of about 2.0 standard drinks of alcohol per week. He reports that he does not use drugs.   Physical Exam: BP (!) 170/81   Pulse 61   Ht 5\' 8"  (1.727 m)   Wt 192 lb (87.1 kg)   BMI 29.19 kg/m   Constitutional:  Alert and oriented, No acute distress. HEENT: [place] AT Respiratory: Normal respiratory effort, no increased work of breathing. GI: Abdomen is soft, nontender, nondistended, no abdominal masses Psychiatric: Normal mood and affect.  Laboratory Data:  Urinalysis Dipstick/microscopy negative   Assessment & Plan:    1.  Bulbar urethral stricture Scheduled for cystoscopy with balloon dilation of urethral stricture The procedure was discussed in detail as outlined  in today's cystoscopy note. All questions were answered and he desires to proceed.   Riki Altes, MD  [facility] [address] [place], [place] [address] [phone]

## 2023-05-28 ENCOUNTER — Other Ambulatory Visit: Payer: Self-pay

## 2023-05-28 DIAGNOSIS — N35912 Unspecified bulbous urethral stricture, male: Secondary | ICD-10-CM

## 2023-05-28 NOTE — Progress Notes (Unsigned)
Surgical Physician Order Form [facility]  Dr. Irineo Axon, MD  * Scheduling expectation : Patient preference  *Length of Case: 30 minutes  *Clearance needed: no  *Anticoagulation Instructions: N/A  *Aspirin Instructions: N/A  *Post-op visit Date/Instructions: Cath removal 2 days postop  *Diagnosis: Bulbar urethral stricture  *Procedure:  Cysto w/urethral dilation ([address]) Optilume   Additional orders: N/A  -Admit type: OUTpatient  -Anesthesia: Choice  -VTE Prophylaxis Standing Order SCD's       Other:   -Standing Lab Orders Per Anesthesia    Lab other: UA&Urine Culture-ordered  -Standing Test orders EKG/Chest x-ray per Anesthesia       Test other:   - Medications:  Ancef 2gm IV  -Other orders:  N/A

## 2023-05-29 ENCOUNTER — Encounter: Payer: Self-pay | Admitting: Urgent Care

## 2023-05-29 ENCOUNTER — Telehealth: Payer: Self-pay

## 2023-05-29 NOTE — Telephone Encounter (Signed)
Advise patient may a some spoting of blood after cysto . Let us know if he having any pain , fever , chills.

## 2023-05-29 NOTE — Progress Notes (Signed)
   [facility]- Surgical Posting Form  Surgery Date: Date: 06/05/2023  Surgeon: Dr. Irineo Axon, MD  Inpt ( No  )   Outpt (Yes)   Obs ( No  )   Diagnosis: N35.912 Urethral Stricture  -CPT: 16109  Surgery: Cystoscopy with Urethral Balloon Dilation Using Optilume  Stop Anticoagulations: N/A  Cardiac/Medical/Pulmonary Clearance needed: no  *Orders entered into EPIC  Date: 05/29/23   *Case booked in EPIC  Date: 05/29/23  *Notified pt of Surgery: Date: 05/29/23  PRE-OP UA & CX: yes- ordered per Dr. Lonna Cobb  *Placed into Prior Authorization Work Angela Nevin Date: 05/29/23  Assistant/laser/rep:No

## 2023-05-29 NOTE — Telephone Encounter (Signed)
Pts wife Harry Patterson states pt has had a small amount of spotting since cystoscopy on 10/25.   No fever No pain     Is there anything they need to do? Pls advise.

## 2023-05-29 NOTE — Progress Notes (Signed)
  Perioperative Services Pre-Admission/Anesthesia Testing     Date: 05/29/23  Name: Harry Patterson. MRN:   [id number]  Re: Request from surgery for clearance prior to scheduled procedure  Patient is scheduled to undergo a CYSTOSCOPY WITH URETHRAL BALLOON DILATATION USING OPTILUME on 06/05/2023 with Dr. Irineo Axon, MD.   Patient has not been scheduled for his PAT appointment at this point, thus has not undergone review by PAT RN and/or APP. Received communication from primary attending surgeon's office requesting upcoming procedure. In preparation for procedure, preoperative clearance from cardiology to be requested.   PROVIDER SPECIALTY   Arrie Senate, MD  Cardiology   Plan:  Clearance documents generated and faxed to appropriate provider(s) as noted above. Note will be updated to reflect communication with provider's office as it relates to clearance being provided and/or the need for office visit prior to clearance for surgery being issued.   Quentin Mulling, MSN, APRN, FNP-C, CEN [facility]  Perioperative Services Nurse Practitioner Phone: [phone] 05/29/23 3:04 PM  NOTE: This note has been prepared using Dragon dictation software. Despite my best ability to proofread, there is always the potential that unintentional transcriptional errors may still occur from this process.

## 2023-05-31 ENCOUNTER — Encounter
Admission: RE | Admit: 2023-05-31 | Discharge: 2023-05-31 | Disposition: A | Discharge status: Home / Self Care | Payer: BC Managed Care – PPO | Source: Ambulatory Visit | Attending: Urology | Admitting: Urology

## 2023-05-31 ENCOUNTER — Other Ambulatory Visit: Payer: Self-pay

## 2023-05-31 DIAGNOSIS — I10 Essential (primary) hypertension: Secondary | ICD-10-CM

## 2023-05-31 DIAGNOSIS — I447 Left bundle-branch block, unspecified: Secondary | ICD-10-CM

## 2023-05-31 NOTE — Patient Instructions (Addendum)
Your procedure is scheduled on: 06/05/23 - Tuesday Report to the Registration Desk on the 1st floor of the [facility]. To find out your arrival time, please call [phone] between 1PM - 3PM on: 06/04/23 - Monday If your arrival time is 6:00 am, do not arrive before that time as the [facility] entrance doors do not open until 6:00 am.  REMEMBER: Instructions that are not followed completely may result in serious medical risk, up to and including death; or upon the discretion of your surgeon and anesthesiologist your surgery may need to be rescheduled.  Do not eat food or drink any liquids after midnight the night before surgery.  No gum chewing or hard candies.   One week prior to surgery: Stop Anti-inflammatories (NSAIDS) such as Advil, Aleve, Ibuprofen, Motrin, Naproxen, Naprosyn and Aspirin based products such as Excedrin, Goody's Powder, BC Powder. You may however, continue to take Tylenol if needed for pain up until the day of surgery.  Stop ANY OVER THE COUNTER supplements until after surgery : ALPHA LIPOIC ACID , folic acid (FOLVITE) , omega-3 acid,  vitamin C , Cholecalciferol .   HOLD on the day of surgery : losartan (COZAAR) , chlorthalidone (HYGROTON) , spironolactone (ALDACTONE)    ON THE DAY OF SURGERY ONLY TAKE THESE MEDICATIONS WITH SIPS OF WATER:  gabapentin (NEURONTIN)  metoprolol succinate (TOPROL ) pantoprazole (PROTONIX)  tamsulosin (FLOMAX)    No Alcohol for 24 hours before or after surgery.  No Smoking including e-cigarettes for 24 hours before surgery.  No chewable tobacco products for at least 6 hours before surgery.  No nicotine patches on the day of surgery.  Do not use any "recreational" drugs for at least a week (preferably 2 weeks) before your surgery.  Please be advised that the combination of cocaine and anesthesia may have negative outcomes, up to and including death. If you test positive for cocaine, your surgery will be cancelled.  On  the morning of surgery brush your teeth with toothpaste and water, you may rinse your mouth with mouthwash if you wish. Do not swallow any toothpaste or mouthwash.  Do not wear jewelry, make-up, hairpins, clips or nail polish.  For welded (permanent) jewelry: bracelets, anklets, waist bands, etc.  Please have this removed prior to surgery.  If it is not removed, there is a chance that hospital personnel will need to cut it off on the day of surgery.  Do not wear lotions, powders, or perfumes.   Do not shave body hair from the neck down 48 hours before surgery.  Contact lenses, hearing aids and dentures may not be worn into surgery.  Do not bring valuables to the hospital. [facility] is not responsible for any missing/lost belongings or valuables.   Bring your C-PAP to the hospital in case you may have to spend the night.   Notify your doctor if there is any change in your medical condition (cold, fever, infection).  Wear comfortable clothing (specific to your surgery type) to the hospital.  After surgery, you can help prevent lung complications by doing breathing exercises.  Take deep breaths and cough every 1-2 hours. Your doctor may order a device called an Incentive Spirometer to help you take deep breaths. When coughing or sneezing, hold a pillow firmly against your incision with both hands. This is called "splinting." Doing this helps protect your incision. It also decreases belly discomfort.  If you are being admitted to the hospital overnight, leave your suitcase in the car.  After surgery it may be brought to your room.  In case of increased patient census, it may be necessary for you, the patient, to continue your postoperative care in the Same Day Surgery department.  If you are being discharged the day of surgery, you will not be allowed to drive home. You will need a responsible individual to drive you home and stay with you for 24 hours after surgery.   If you are taking  public transportation, you will need to have a responsible individual with you.  Please call the Pre-admissions Testing Dept. at [phone] if you have any questions about these instructions.  Surgery Visitation Policy:  Patients having surgery or a procedure may have two visitors.  Children under the age of 74 must have an adult with them who is not the patient.  Inpatient Visitation:    Visiting hours are 7 a.m. to 8 p.m. Up to four visitors are allowed at one time in a patient room. The visitors may rotate out with other people during the day.  One visitor age 53 or older may stay with the patient overnight and must be in the room by 8 p.m.

## 2023-06-04 ENCOUNTER — Encounter
Admission: RE | Admit: 2023-06-04 | Discharge: 2023-06-04 | Disposition: A | Discharge status: Home / Self Care | Payer: BC Managed Care – PPO | Source: Ambulatory Visit | Attending: Urology | Admitting: Urology

## 2023-06-04 DIAGNOSIS — K219 Gastro-esophageal reflux disease without esophagitis: Secondary | ICD-10-CM | POA: Diagnosis not present

## 2023-06-04 DIAGNOSIS — Z01810 Encounter for preprocedural cardiovascular examination: Secondary | ICD-10-CM | POA: Insufficient documentation

## 2023-06-04 DIAGNOSIS — I2510 Atherosclerotic heart disease of native coronary artery without angina pectoris: Secondary | ICD-10-CM | POA: Diagnosis not present

## 2023-06-04 DIAGNOSIS — I447 Left bundle-branch block, unspecified: Secondary | ICD-10-CM | POA: Diagnosis not present

## 2023-06-04 DIAGNOSIS — G4730 Sleep apnea, unspecified: Secondary | ICD-10-CM | POA: Diagnosis not present

## 2023-06-04 DIAGNOSIS — I10 Essential (primary) hypertension: Secondary | ICD-10-CM | POA: Insufficient documentation

## 2023-06-04 DIAGNOSIS — N35912 Unspecified bulbous urethral stricture, male: Secondary | ICD-10-CM | POA: Diagnosis present

## 2023-06-04 MED ORDER — CEFAZOLIN SODIUM-DEXTROSE 2-4 GM/100ML-% IV SOLN
2.0000 g | INTRAVENOUS | Status: AC
  Administered 2023-06-05: 2 g via INTRAVENOUS

## 2023-06-04 MED ORDER — CHLORHEXIDINE GLUCONATE 0.12 % MT SOLN
15.0000 mL | Freq: Once | OROMUCOSAL | Status: AC
  Administered 2023-06-05: 15 mL via OROMUCOSAL

## 2023-06-04 MED ORDER — ORAL CARE MOUTH RINSE: 15.0000 mL | Freq: Once | OROMUCOSAL | Status: AC

## 2023-06-04 MED ORDER — LACTATED RINGERS IV SOLN: INTRAVENOUS | Status: DC

## 2023-06-05 ENCOUNTER — Other Ambulatory Visit: Payer: Self-pay

## 2023-06-05 ENCOUNTER — Ambulatory Visit
Admission: RE | Admit: 2023-06-05 | Discharge: 2023-06-05 | Disposition: A | Discharge status: Home / Self Care | Payer: BC Managed Care – PPO | Attending: Urology | Admitting: Urology

## 2023-06-05 ENCOUNTER — Encounter: Payer: Self-pay | Admitting: Urology

## 2023-06-05 ENCOUNTER — Ambulatory Visit: Payer: BC Managed Care – PPO | Admitting: Urgent Care

## 2023-06-05 ENCOUNTER — Encounter
Admission: RE | Disposition: A | Discharge status: Home / Self Care | Payer: Self-pay | Source: Home / Self Care | Attending: Urology

## 2023-06-05 DIAGNOSIS — I10 Essential (primary) hypertension: Secondary | ICD-10-CM | POA: Insufficient documentation

## 2023-06-05 DIAGNOSIS — I2510 Atherosclerotic heart disease of native coronary artery without angina pectoris: Secondary | ICD-10-CM | POA: Insufficient documentation

## 2023-06-05 DIAGNOSIS — I447 Left bundle-branch block, unspecified: Secondary | ICD-10-CM | POA: Insufficient documentation

## 2023-06-05 DIAGNOSIS — N35912 Unspecified bulbous urethral stricture, male: Secondary | ICD-10-CM | POA: Diagnosis not present

## 2023-06-05 DIAGNOSIS — G4730 Sleep apnea, unspecified: Secondary | ICD-10-CM | POA: Insufficient documentation

## 2023-06-05 DIAGNOSIS — K219 Gastro-esophageal reflux disease without esophagitis: Secondary | ICD-10-CM | POA: Insufficient documentation

## 2023-06-05 DIAGNOSIS — Z01810 Encounter for preprocedural cardiovascular examination: Secondary | ICD-10-CM | POA: Diagnosis not present

## 2023-06-05 HISTORY — DX: Chronic prostatitis: N41.1

## 2023-06-05 HISTORY — DX: Long term (current) use of aspirin: Z79.82

## 2023-06-05 HISTORY — DX: Bilateral inguinal hernia, without obstruction or gangrene, not specified as recurrent: K40.20

## 2023-06-05 HISTORY — DX: Vitamin D deficiency, unspecified: E55.9

## 2023-06-05 HISTORY — DX: Obstructive sleep apnea (adult) (pediatric): G47.33

## 2023-06-05 HISTORY — DX: Prediabetes: R73.03

## 2023-06-05 HISTORY — DX: Atherosclerotic heart disease of native coronary artery without angina pectoris: I25.10

## 2023-06-05 HISTORY — DX: Hyperlipidemia, unspecified: E78.5

## 2023-06-05 HISTORY — DX: Unspecified malignant neoplasm of skin, unspecified: C44.90

## 2023-06-05 HISTORY — DX: Benign neoplasm of colon, unspecified: D12.6

## 2023-06-05 HISTORY — DX: Male erectile dysfunction, unspecified: N52.9

## 2023-06-05 HISTORY — DX: Essential (primary) hypertension: I10

## 2023-06-05 HISTORY — DX: Other intervertebral disc degeneration, lumbar region without mention of lumbar back pain or lower extremity pain: M51.369

## 2023-06-05 HISTORY — PX: CYSTOSCOPY WITH URETHRAL DILATATION: SHX5125

## 2023-06-05 SURGERY — CYSTOSCOPY, WITH URETHRAL DILATION
Anesthesia: General

## 2023-06-05 MED ORDER — PROPOFOL 10 MG/ML IV BOLUS
INTRAVENOUS | Status: DC | PRN
  Administered 2023-06-05: 40 mg via INTRAVENOUS

## 2023-06-05 MED ORDER — CEFAZOLIN SODIUM-DEXTROSE 2-4 GM/100ML-% IV SOLN
INTRAVENOUS | Status: AC
  Filled 2023-06-05: qty 100

## 2023-06-05 MED ORDER — FENTANYL CITRATE (PF) 100 MCG/2ML IJ SOLN: 25.0000 ug | INTRAMUSCULAR | Status: DC | PRN

## 2023-06-05 MED ORDER — PROPOFOL 1000 MG/100ML IV EMUL
INTRAVENOUS | Status: AC
  Filled 2023-06-05: qty 100

## 2023-06-05 MED ORDER — FENTANYL CITRATE (PF) 100 MCG/2ML IJ SOLN
INTRAMUSCULAR | Status: AC
  Filled 2023-06-05: qty 2

## 2023-06-05 MED ORDER — PROPOFOL 10 MG/ML IV BOLUS
INTRAVENOUS | Status: AC
Start: 2023-06-05 — End: ?
  Filled 2023-06-05: qty 20

## 2023-06-05 MED ORDER — ONDANSETRON HCL 4 MG/2ML IJ SOLN
INTRAMUSCULAR | Status: AC
Start: 2023-06-05 — End: ?
  Filled 2023-06-05: qty 2

## 2023-06-05 MED ORDER — STERILE WATER FOR IRRIGATION IR SOLN
Status: DC | PRN
  Administered 2023-06-05: 3000 mL

## 2023-06-05 MED ORDER — EPHEDRINE 5 MG/ML INJ
INTRAVENOUS | Status: AC
  Filled 2023-06-05: qty 5

## 2023-06-05 MED ORDER — PROPOFOL 500 MG/50ML IV EMUL
INTRAVENOUS | Status: DC | PRN
  Administered 2023-06-05: 50 ug/kg/min via INTRAVENOUS

## 2023-06-05 MED ORDER — LIDOCAINE HCL (PF) 2 % IJ SOLN
INTRAMUSCULAR | Status: AC
  Filled 2023-06-05: qty 5

## 2023-06-05 MED ORDER — OXYBUTYNIN CHLORIDE 5 MG PO TABS
ORAL_TABLET | ORAL | 0 refills | Status: DC
Start: 2023-06-05 — End: 2023-07-05

## 2023-06-05 MED ORDER — ACETAMINOPHEN 10 MG/ML IV SOLN
INTRAVENOUS | Status: AC
  Filled 2023-06-05: qty 100

## 2023-06-05 MED ORDER — LIDOCAINE HCL URETHRAL/MUCOSAL 2 % EX GEL
CUTANEOUS | Status: DC | PRN
  Administered 2023-06-05: 1

## 2023-06-05 MED ORDER — ACETAMINOPHEN 10 MG/ML IV SOLN
INTRAVENOUS | Status: DC | PRN
  Administered 2023-06-05: 1000 mg via INTRAVENOUS

## 2023-06-05 MED ORDER — DEXAMETHASONE SODIUM PHOSPHATE 10 MG/ML IJ SOLN
INTRAMUSCULAR | Status: AC
Start: 2023-06-05 — End: ?
  Filled 2023-06-05: qty 1

## 2023-06-05 MED ORDER — LIDOCAINE HCL URETHRAL/MUCOSAL 2 % EX GEL
CUTANEOUS | Status: AC
  Filled 2023-06-05: qty 10

## 2023-06-05 MED ORDER — DEXMEDETOMIDINE HCL IN NACL 200 MCG/50ML IV SOLN
INTRAVENOUS | Status: DC | PRN
  Administered 2023-06-05 (×2): 4 ug via INTRAVENOUS

## 2023-06-05 MED ORDER — FENTANYL CITRATE (PF) 100 MCG/2ML IJ SOLN
INTRAMUSCULAR | Status: DC | PRN
  Administered 2023-06-05 (×2): 50 ug via INTRAVENOUS

## 2023-06-05 SURGICAL SUPPLY — 23 items
BAG DRN RND TRDRP ANRFLXCHMBR (UROLOGICAL SUPPLIES) ×1
BAG URINE DRAIN 2000ML AR STRL (UROLOGICAL SUPPLIES) ×1 IMPLANT
BALLN OPTILUME DCB 24X5X75 (BALLOONS)
BALLN OPTILUME DCB 30X3X75 (BALLOONS) ×1
BALLN OPTILUME DCB 30X5X75 (BALLOONS)
BALLOON OPTILUME DCB 24X5X75 (BALLOONS) IMPLANT
BALLOON OPTILUME DCB 30X3X75 (BALLOONS) IMPLANT
BALLOON OPTILUME DCB 30X5X75 (BALLOONS) IMPLANT
CATH FOL 2WAY LX 16X5 (CATHETERS) ×1 IMPLANT
CATH FOLEY 2W COUNCIL 20FR 5CC (CATHETERS) IMPLANT
CATH SET URETHRAL DILATOR (CATHETERS) ×1 IMPLANT
ELECT REM PT RETURN 9FT ADLT (ELECTROSURGICAL)
ELECTRODE REM PT RTRN 9FT ADLT (ELECTROSURGICAL) ×1 IMPLANT
GLOVE BIOGEL PI IND STRL 7.5 (GLOVE) ×1 IMPLANT
GOWN STRL REUS W/ TWL XL LVL3 (GOWN DISPOSABLE) ×2 IMPLANT
GOWN STRL REUS W/TWL XL LVL3 (GOWN DISPOSABLE) ×2
GUIDEWIRE STR DUAL SENSOR (WIRE) ×1 IMPLANT
HOLDER FOLEY CATH W/STRAP (MISCELLANEOUS) ×1 IMPLANT
PACK CYSTO AR (MISCELLANEOUS) ×1 IMPLANT
SET CYSTO W/LG BORE CLAMP LF (SET/KITS/TRAYS/PACK) ×1 IMPLANT
SYR 30ML LL (SYRINGE) ×1 IMPLANT
WATER STERILE IRR 3000ML UROMA (IV SOLUTION) ×1 IMPLANT
WATER STERILE IRR 500ML POUR (IV SOLUTION) ×1 IMPLANT

## 2023-06-05 NOTE — Anesthesia Preprocedure Evaluation (Signed)
Anesthesia Evaluation  Patient identified by MRN, date of birth, ID band Patient awake    Reviewed: Allergy & Precautions, NPO status , Patient's Chart, lab work & pertinent test results  History of Anesthesia Complications Negative for: history of anesthetic complications  Airway Mallampati: III  TM Distance: <3 FB Neck ROM: full    Dental  (+) Chipped, Poor Dentition, Missing, Dental Advidsory Given   Pulmonary neg shortness of breath, sleep apnea , neg recent URI   Pulmonary exam normal        Cardiovascular Exercise Tolerance: Good hypertension, (-) angina + CAD  Normal cardiovascular exam+ dysrhythmias (LBBB)      Neuro/Psych neg Seizures  Neuromuscular disease  negative psych ROS   GI/Hepatic Neg liver ROS,GERD  Controlled,,  Endo/Other  negative endocrine ROS    Renal/GU Renal disease  negative genitourinary   Musculoskeletal   Abdominal   Peds  Hematology negative hematology ROS (+)   Anesthesia Other Findings Past Medical History: No date: Allergic rhinitis No date: Arthritis     Comment:  knuckles No date: Avascular necrosis of bones of both hips (HCC) No date: B12 deficiency No date: Barrett's esophagus without dysplasia No date: Bilateral renal cysts No date: Cancer (HCC)     Comment:  skin No date: Enlarged prostate No date: GERD (gastroesophageal reflux disease) No date: Hypertension No date: LBBB (left bundle branch block) No date: Leukopenia No date: Peripheral neuropathy     Comment:  feet No date: Sleep apnea     Comment:  CPAP No date: Substance abuse [facility])  Past Surgical History: No date: APPENDECTOMY 06/11/2019: CATARACT EXTRACTION W/PHACO; Right     Comment:  Procedure: CATARACT EXTRACTION PHACO AND INTRAOCULAR               LENS PLACEMENT (IOC) RIGHT 00:54.6     22.0%    12.00;                Surgeon: Lockie Mola, MD;  Location: [facility];   Service: Ophthalmology;  Laterality:               Right;  sleep apnea 06/30/2019: CATARACT EXTRACTION W/PHACO; Left     Comment:  Procedure: CATARACT EXTRACTION PHACO AND INTRAOCULAR               LENS PLACEMENT (IOC) LEFT TORIC LENS 9.97  01:09.1                14.4%;  Surgeon: Lockie Mola, MD;  Location:               [facility];  Service: Ophthalmology;                Laterality: Left; No date: COLONOSCOPY W/ POLYPECTOMY     Comment:  02/08/00,11'12'04,05/05/05, 08/21/2008, 12/05/2013,               05/08/2014 06/06/2017: COLONOSCOPY WITH PROPOFOL; N/A     Comment:  Procedure: COLONOSCOPY WITH PROPOFOL;  Surgeon: Scot Jun, MD;  Location: [facility] ENDOSCOPY;  Service:               Endoscopy;  Laterality: N/A; No date: ESOPHAGOGASTRODUODENOSCOPY     Comment:  x7 06/06/2017: ESOPHAGOGASTRODUODENOSCOPY (EGD) WITH PROPOFOL; N/A     Comment:  Procedure: ESOPHAGOGASTRODUODENOSCOPY (EGD) WITH  PROPOFOL;  Surgeon: Scot Jun, MD;  Location:               [facility] ENDOSCOPY;  Service: Endoscopy;  Laterality: N/A; 11/27/2019: ESOPHAGOGASTRODUODENOSCOPY (EGD) WITH PROPOFOL; N/A     Comment:  Procedure: ESOPHAGOGASTRODUODENOSCOPY (EGD) WITH               PROPOFOL;  Surgeon: Toledo, Boykin Nearing, MD;  Location:               [facility] ENDOSCOPY;  Service: Gastroenterology;  Laterality:               N/A; No date: HEMORRHOID SURGERY No date: TONSILLECTOMY  BMI    Body Mass Index: 28.89 kg/m      Reproductive/Obstetrics negative OB ROS                             Anesthesia Physical Anesthesia Plan  ASA: 2  Anesthesia Plan: General   Post-op Pain Management:    Induction: Intravenous  PONV Risk Score and Plan: Propofol infusion and TIVA  Airway Management Planned: Natural Airway and Simple Face Mask  Additional Equipment:   Intra-op Plan:   Post-operative Plan:   Informed Consent: I have reviewed the  patients History and Physical, chart, labs and discussed the procedure including the risks, benefits and alternatives for the proposed anesthesia with the patient or authorized representative who has indicated his/her understanding and acceptance.     Dental Advisory Given  Plan Discussed with: Anesthesiologist, CRNA and Surgeon  Anesthesia Plan Comments: (Patient consented for risks of anesthesia including but not limited to:  - adverse reactions to medications - risk of airway placement if required - damage to eyes, teeth, lips or other oral mucosa - nerve damage due to positioning  - sore throat or hoarseness - Damage to heart, brain, nerves, lungs, other parts of body or loss of life  Patient voiced understanding.)       Anesthesia Quick Evaluation

## 2023-06-05 NOTE — Discharge Instructions (Signed)
Cystoscopy patient instructions  Following a cystoscopy, a catheter (a flexible rubber tube) is sometimes left in place to empty the bladder. This may cause some discomfort or a feeling that you need to urinate. Your doctor determines the period of time that the catheter will be left in place. You may have bloody urine for two to three days (Call your doctor if the amount of bleeding increases or does not subside).  You may pass blood clots in your urine, especially if you had a biopsy. It is not unusual to pass small blood clots and have some bloody urine a couple of weeks after your cystoscopy. Again, call your doctor if the bleeding does not subside. You may have: Dysuria (painful urination) Frequency (urinating often) Urgency (strong desire to urinate)  These symptoms are common especially if medicine is instilled into the bladder or a ureteral stent is placed. Avoiding alcohol and caffeine, such as coffee, tea, and chocolate, may help relieve these symptoms. Drink plenty of water, unless otherwise instructed. Your doctor may also prescribe an antibiotic or other medicine to reduce these symptoms.  Cystoscopy results are available soon after the procedure; biopsy results usually take two to four days. Your doctor will discuss the results of your exam with you. Before you go home, you will be given specific instructions for follow-up care. Special Instructions:   If you are going home with a catheter in place do not take a tub bath until removed by your doctor.   You may resume your normal activities.   Do not drive or operate machinery if you are taking narcotic pain medicine.   Be sure to keep all follow-up appointments with your doctor.  A prescription for oxybutynin which can help with bladder spasms/catheter irritation was sent to your pharmacy If sexually active condom with intercourse x 4 weeks  Call Your Doctor If: The catheter is not draining You have severe pain You are unable  to urinate You have a fever over 101 You have severe bleeding

## 2023-06-05 NOTE — Interval H&P Note (Signed)
History and Physical Interval Note:  06/05/2023 8:25 AM  Harry Patterson.  has presented today for surgery, with the diagnosis of Urethral Stricture.  The various methods of treatment have been discussed with the patient and family. After consideration of risks, benefits and other options for treatment, the patient has consented to  Procedure(s): CYSTOSCOPY WITH URETHRAL BALLOON DILATATION USING OPTILUME (N/A) as a surgical intervention.  The patient's history has been reviewed, patient examined, no change in status, stable for surgery.  I have reviewed the patient's chart and labs.  Questions were answered to the patient's satisfaction.    CV:RRR Lungs:clear  Riki Altes

## 2023-06-05 NOTE — Transfer of Care (Signed)
Immediate Anesthesia Transfer of Care Note  Patient: Harry Patterson.  Procedure(s) Performed: CYSTOSCOPY WITH URETHRAL BALLOON DILATATION USING OPTILUME  Patient Location: PACU  Anesthesia Type:General  Level of Consciousness: awake, alert , and oriented  Airway & Oxygen Therapy: Patient Spontanous Breathing and Patient connected to face mask oxygen  Post-op Assessment: Report given to RN and Post -op Vital signs reviewed and stable  Post vital signs: Reviewed and stable  Last Vitals:  Vitals Value Taken Time  BP 133/84 06/05/23 0917  Temp    Pulse 53 06/05/23 0922  Resp 12 06/05/23 0922  SpO2 98 % 06/05/23 0922  Vitals shown include unfiled device data.  Last Pain:  Vitals:   06/05/23 0756  TempSrc: Oral  PainSc: 0-No pain         Complications: No notable events documented.

## 2023-06-05 NOTE — Anesthesia Procedure Notes (Signed)
Date/Time: 06/05/2023 8:56 AM  Performed by: Malva Cogan, CRNAPre-anesthesia Checklist: Patient identified, Emergency Drugs available, Suction available, Patient being monitored and Timeout performed Patient Re-evaluated:Patient Re-evaluated prior to induction Oxygen Delivery Method: Nasal cannula Induction Type: IV induction Placement Confirmation: CO2 detector and positive ETCO2

## 2023-06-05 NOTE — Op Note (Signed)
   Preoperative diagnosis:  Bulbar urethral stricture  Postoperative diagnosis:  Bulbar urethra stricture  Procedure: Balloon dilation urethral stricture (Optilume DBC)  Surgeon: Riki Altes, MD  Anesthesia: MAC  Complications: None  Intraoperative findings:  Wide caliber distal bulbar stricture ~ 69F proximal bulbar stricture measuring 1 cm Prostate minimal lateral lobe enlargement and mild bladder neck elevation.  Bladder mucosa without erythema, solid or papillary lesions.  Ureteral orifices normal-appearing bilaterally  EBL: Minimal  Specimens: None  Indication: Harry Patterson. is a 77 y.o. patient with bothersome lower urinary tract symptoms not improved on tamsulosin.  Cystoscopy with a bulbar urethral stricture.  After reviewing the management options for treatment, he elected to proceed with the above surgical procedure(s). We have discussed the potential benefits and risks of the procedure, side effects of the proposed treatment, the likelihood of the patient achieving the goals of the procedure, and any potential problems that might occur during the procedure or recuperation. Informed consent has been obtained.  Description of procedure:  The patient was taken to the operating room and deep sedation was obtained by anesthesia.  The patient was placed in the dorsal lithotomy position, prepped and draped in the usual sterile fashion, and preoperative antibiotics were administered. A preoperative time-out was performed.   A 21 French cystoscope with 30 degree lens was lubricated, inserted per urethra and advanced proximally under direct vision until the stricture was identified as described above.  A 0.038 Sensor wire was advanced through the cystoscope and through the stricture into the bladder.  The cystoscope was removed and the stricture was dilated with over-the-wire disposable dilators from 69F-22F.  The cystoscope was then repassed alongside the guidewire  under direct vision into the bladder with findings as described above.  The guidewire was removed and repassed through the cystoscope into the bladder.  The scope was backed out just distal to the stricture.  A 73F/30 mm Optilume catheter was then advanced over the wire and positioned through the stricture.  The balloon was inflated to 10 atm and kept inflated for 5 minutes.  No irrigant was used through the cystoscope during this portion of the procedure and for the remainder of the procedure.  The balloon was then deflated and the dilating catheter was removed.  A 22F Council catheter was then advanced over the wire without difficulty.  The balloon was inflated with 10 mL of sterile water and placed to gravity drainage.  The patient was then transported to the PACU in stable condition.  Plan: The catheter will be removed in 48 hours Condom with intercourse x 1 month Postop follow-up 1 month    Riki Altes, M.D.

## 2023-06-07 ENCOUNTER — Ambulatory Visit: Payer: BC Managed Care – PPO | Admitting: Physician Assistant

## 2023-06-07 DIAGNOSIS — N35912 Unspecified bulbous urethral stricture, male: Secondary | ICD-10-CM

## 2023-06-07 NOTE — Progress Notes (Signed)
Catheter Removal  Patient is present today for a catheter removal.  10ml of water was drained from the balloon. A 20FR foley cath was removed from the bladder, no complications were noted. Patient tolerated well.  Performed by: Randa Lynn, RMA

## 2023-06-08 NOTE — Anesthesia Postprocedure Evaluation (Signed)
Anesthesia Post Note  Patient: Harry Patterson.  Procedure(s) Performed: CYSTOSCOPY WITH URETHRAL BALLOON DILATATION USING OPTILUME  Patient location during evaluation: PACU Anesthesia Type: General Level of consciousness: awake and alert Pain management: pain level controlled Vital Signs Assessment: post-procedure vital signs reviewed and stable Respiratory status: spontaneous breathing, nonlabored ventilation, respiratory function stable and patient connected to nasal cannula oxygen Cardiovascular status: blood pressure returned to baseline and stable Postop Assessment: no apparent nausea or vomiting Anesthetic complications: no   No notable events documented.   Last Vitals:  Vitals:   06/05/23 0945 06/05/23 0950  BP:  (!) 165/84  Pulse: (!) 50 (!) 56  Resp: 10 16  Temp: (!) 36.3 C (!) 36.3 C  SpO2: 96% 97%    Last Pain:  Vitals:   06/06/23 0907  TempSrc:   PainSc: 0-No pain                 Lenard Simmer

## 2023-06-11 ENCOUNTER — Other Ambulatory Visit: Payer: Self-pay | Admitting: Internal Medicine

## 2023-06-11 DIAGNOSIS — N1832 Chronic kidney disease, stage 3b: Secondary | ICD-10-CM

## 2023-06-14 ENCOUNTER — Ambulatory Visit
Admission: RE | Admit: 2023-06-14 | Discharge: 2023-06-14 | Disposition: A | Discharge status: Home / Self Care | Payer: BC Managed Care – PPO | Source: Ambulatory Visit | Attending: Internal Medicine | Admitting: Internal Medicine

## 2023-06-14 DIAGNOSIS — N1832 Chronic kidney disease, stage 3b: Secondary | ICD-10-CM | POA: Insufficient documentation

## 2023-06-14 DIAGNOSIS — K5751 Diverticulosis of both small and large intestine without perforation or abscess with bleeding: Secondary | ICD-10-CM | POA: Diagnosis not present

## 2023-06-14 DIAGNOSIS — D62 Acute posthemorrhagic anemia: Secondary | ICD-10-CM | POA: Diagnosis not present

## 2023-06-15 ENCOUNTER — Ambulatory Visit: Payer: BC Managed Care – PPO | Admitting: Urology

## 2023-06-16 ENCOUNTER — Encounter: Payer: Self-pay | Admitting: Internal Medicine

## 2023-06-16 ENCOUNTER — Inpatient Hospital Stay
Admission: EM | Admit: 2023-06-16 | Discharge: 2023-06-21 | DRG: 378 | Disposition: A | Discharge status: Other Acute Inpatient Hospital | Payer: BC Managed Care – PPO | Attending: Internal Medicine | Admitting: Internal Medicine

## 2023-06-16 ENCOUNTER — Inpatient Hospital Stay: Payer: BC Managed Care – PPO

## 2023-06-16 DIAGNOSIS — K5751 Diverticulosis of both small and large intestine without perforation or abscess with bleeding: Secondary | ICD-10-CM | POA: Diagnosis present

## 2023-06-16 DIAGNOSIS — N179 Acute kidney failure, unspecified: Secondary | ICD-10-CM | POA: Diagnosis not present

## 2023-06-16 DIAGNOSIS — K2270 Barrett's esophagus without dysplasia: Secondary | ICD-10-CM | POA: Diagnosis present

## 2023-06-16 DIAGNOSIS — Z8249 Family history of ischemic heart disease and other diseases of the circulatory system: Secondary | ICD-10-CM

## 2023-06-16 DIAGNOSIS — K921 Melena: Secondary | ICD-10-CM | POA: Diagnosis not present

## 2023-06-16 DIAGNOSIS — E872 Acidosis, unspecified: Secondary | ICD-10-CM | POA: Diagnosis present

## 2023-06-16 DIAGNOSIS — R7303 Prediabetes: Secondary | ICD-10-CM | POA: Diagnosis present

## 2023-06-16 DIAGNOSIS — Z9842 Cataract extraction status, left eye: Secondary | ICD-10-CM | POA: Diagnosis not present

## 2023-06-16 DIAGNOSIS — Z85828 Personal history of other malignant neoplasm of skin: Secondary | ICD-10-CM

## 2023-06-16 DIAGNOSIS — K922 Gastrointestinal hemorrhage, unspecified: Principal | ICD-10-CM | POA: Diagnosis present

## 2023-06-16 DIAGNOSIS — E7800 Pure hypercholesterolemia, unspecified: Secondary | ICD-10-CM | POA: Diagnosis present

## 2023-06-16 DIAGNOSIS — K5900 Constipation, unspecified: Secondary | ICD-10-CM | POA: Diagnosis not present

## 2023-06-16 DIAGNOSIS — Z880 Allergy status to penicillin: Secondary | ICD-10-CM

## 2023-06-16 DIAGNOSIS — K5521 Angiodysplasia of colon with hemorrhage: Secondary | ICD-10-CM | POA: Diagnosis not present

## 2023-06-16 DIAGNOSIS — Z7982 Long term (current) use of aspirin: Secondary | ICD-10-CM | POA: Diagnosis not present

## 2023-06-16 DIAGNOSIS — Z79899 Other long term (current) drug therapy: Secondary | ICD-10-CM

## 2023-06-16 DIAGNOSIS — R0602 Shortness of breath: Secondary | ICD-10-CM | POA: Diagnosis not present

## 2023-06-16 DIAGNOSIS — K5731 Diverticulosis of large intestine without perforation or abscess with bleeding: Principal | ICD-10-CM | POA: Diagnosis present

## 2023-06-16 DIAGNOSIS — G629 Polyneuropathy, unspecified: Secondary | ICD-10-CM | POA: Diagnosis present

## 2023-06-16 DIAGNOSIS — I129 Hypertensive chronic kidney disease with stage 1 through stage 4 chronic kidney disease, or unspecified chronic kidney disease: Secondary | ICD-10-CM | POA: Diagnosis present

## 2023-06-16 DIAGNOSIS — K449 Diaphragmatic hernia without obstruction or gangrene: Secondary | ICD-10-CM | POA: Diagnosis present

## 2023-06-16 DIAGNOSIS — I2510 Atherosclerotic heart disease of native coronary artery without angina pectoris: Secondary | ICD-10-CM | POA: Diagnosis present

## 2023-06-16 DIAGNOSIS — K219 Gastro-esophageal reflux disease without esophagitis: Secondary | ICD-10-CM | POA: Diagnosis present

## 2023-06-16 DIAGNOSIS — D62 Acute posthemorrhagic anemia: Secondary | ICD-10-CM | POA: Diagnosis present

## 2023-06-16 DIAGNOSIS — N400 Enlarged prostate without lower urinary tract symptoms: Secondary | ICD-10-CM | POA: Diagnosis present

## 2023-06-16 DIAGNOSIS — F1090 Alcohol use, unspecified, uncomplicated: Secondary | ICD-10-CM | POA: Diagnosis present

## 2023-06-16 DIAGNOSIS — Z961 Presence of intraocular lens: Secondary | ICD-10-CM | POA: Diagnosis present

## 2023-06-16 DIAGNOSIS — N529 Male erectile dysfunction, unspecified: Secondary | ICD-10-CM | POA: Diagnosis present

## 2023-06-16 DIAGNOSIS — R10819 Abdominal tenderness, unspecified site: Secondary | ICD-10-CM | POA: Diagnosis not present

## 2023-06-16 DIAGNOSIS — K640 First degree hemorrhoids: Secondary | ICD-10-CM | POA: Diagnosis present

## 2023-06-16 DIAGNOSIS — Z789 Other specified health status: Secondary | ICD-10-CM | POA: Diagnosis not present

## 2023-06-16 DIAGNOSIS — N1832 Chronic kidney disease, stage 3b: Secondary | ICD-10-CM | POA: Diagnosis present

## 2023-06-16 DIAGNOSIS — I447 Left bundle-branch block, unspecified: Secondary | ICD-10-CM | POA: Diagnosis present

## 2023-06-16 DIAGNOSIS — R000 Tachycardia, unspecified: Secondary | ICD-10-CM | POA: Diagnosis not present

## 2023-06-16 DIAGNOSIS — K31819 Angiodysplasia of stomach and duodenum without bleeding: Secondary | ICD-10-CM | POA: Diagnosis not present

## 2023-06-16 DIAGNOSIS — M87051 Idiopathic aseptic necrosis of right femur: Secondary | ICD-10-CM | POA: Diagnosis present

## 2023-06-16 DIAGNOSIS — I10 Essential (primary) hypertension: Secondary | ICD-10-CM | POA: Diagnosis not present

## 2023-06-16 DIAGNOSIS — E538 Deficiency of other specified B group vitamins: Secondary | ICD-10-CM | POA: Diagnosis present

## 2023-06-16 DIAGNOSIS — Z888 Allergy status to other drugs, medicaments and biological substances status: Secondary | ICD-10-CM

## 2023-06-16 DIAGNOSIS — G4733 Obstructive sleep apnea (adult) (pediatric): Secondary | ICD-10-CM | POA: Diagnosis present

## 2023-06-16 DIAGNOSIS — D649 Anemia, unspecified: Secondary | ICD-10-CM

## 2023-06-16 DIAGNOSIS — Z9841 Cataract extraction status, right eye: Secondary | ICD-10-CM

## 2023-06-16 LAB — URINALYSIS, ROUTINE W REFLEX MICROSCOPIC
Bilirubin Urine: NEGATIVE
Glucose, UA: NEGATIVE mg/dL
Hgb urine dipstick: NEGATIVE
Ketones, ur: NEGATIVE mg/dL
Leukocytes,Ua: NEGATIVE
Nitrite: NEGATIVE
Protein, ur: NEGATIVE mg/dL
Specific Gravity, Urine: 1.014 (ref 1.005–1.030)
pH: 5 (ref 5.0–8.0)

## 2023-06-16 LAB — APTT: aPTT: 22 s — ABNORMAL LOW (ref 24–36)

## 2023-06-16 LAB — CBC
HCT: 22.3 % — ABNORMAL LOW (ref 39.0–52.0)
Hemoglobin: 7.5 g/dL — ABNORMAL LOW (ref 13.0–17.0)
MCH: 34.6 pg — ABNORMAL HIGH (ref 26.0–34.0)
MCHC: 33.6 g/dL (ref 30.0–36.0)
MCV: 102.8 fL — ABNORMAL HIGH (ref 80.0–100.0)
Platelets: 217 10*3/uL (ref 150–400)
RBC: 2.17 MIL/uL — ABNORMAL LOW (ref 4.22–5.81)
RDW: 12.5 % (ref 11.5–15.5)
WBC: 9.3 10*3/uL (ref 4.0–10.5)
nRBC: 0.2 % (ref 0.0–0.2)

## 2023-06-16 LAB — BASIC METABOLIC PANEL
Anion gap: 7 (ref 5–15)
BUN: 83 mg/dL — ABNORMAL HIGH (ref 8–23)
CO2: 21 mmol/L — ABNORMAL LOW (ref 22–32)
Calcium: 9 mg/dL (ref 8.9–10.3)
Chloride: 110 mmol/L (ref 98–111)
Creatinine, Ser: 1.52 mg/dL — ABNORMAL HIGH (ref 0.61–1.24)
GFR, Estimated: 47 mL/min — ABNORMAL LOW (ref >60)
Glucose, Bld: 140 mg/dL — ABNORMAL HIGH (ref 70–99)
Potassium: 4.3 mmol/L (ref 3.5–5.1)
Sodium: 138 mmol/L (ref 135–145)

## 2023-06-16 LAB — PROTIME-INR
INR: 1.2 (ref 0.8–1.2)
Prothrombin Time: 15.1 s (ref 11.4–15.2)

## 2023-06-16 LAB — HEMOGLOBIN AND HEMATOCRIT, BLOOD
HCT: 22.2 % — ABNORMAL LOW (ref 39.0–52.0)
Hemoglobin: 7.6 g/dL — ABNORMAL LOW (ref 13.0–17.0)

## 2023-06-16 LAB — ABO/RH: ABO/RH(D): A POS

## 2023-06-16 LAB — PREPARE RBC (CROSSMATCH)

## 2023-06-16 MED ORDER — FOLIC ACID 1 MG PO TABS
2.0000 mg | ORAL_TABLET | Freq: Every morning | ORAL | Status: DC
  Administered 2023-06-17 – 2023-06-19 (×2): 2 mg via ORAL
  Filled 2023-06-16 (×2): qty 2

## 2023-06-16 MED ORDER — ACETAMINOPHEN 650 MG RE SUPP: 650.0000 mg | Freq: Four times a day (QID) | RECTAL | Status: AC | PRN

## 2023-06-16 MED ORDER — SODIUM CHLORIDE 0.9 % IV BOLUS
500.0000 mL | Freq: Once | INTRAVENOUS | Status: AC
  Administered 2023-06-16: 500 mL via INTRAVENOUS

## 2023-06-16 MED ORDER — CHOLECALCIFEROL 25 MCG (1000 UT) PO CAPS: 2000.0000 [IU] | ORAL_CAPSULE | Freq: Every day | ORAL | Status: DC

## 2023-06-16 MED ORDER — TAMSULOSIN HCL 0.4 MG PO CAPS
0.4000 mg | ORAL_CAPSULE | Freq: Every day | ORAL | Status: DC
  Administered 2023-06-17 – 2023-06-20 (×3): 0.4 mg via ORAL
  Filled 2023-06-16 (×3): qty 1

## 2023-06-16 MED ORDER — METOPROLOL TARTRATE 5 MG/5ML IV SOLN
5.0000 mg | INTRAVENOUS | Status: DC | PRN
  Administered 2023-06-19: 5 mg via INTRAVENOUS
  Filled 2023-06-16: qty 5

## 2023-06-16 MED ORDER — PANTOPRAZOLE SODIUM 40 MG IV SOLR
40.0000 mg | INTRAVENOUS | Status: AC
  Administered 2023-06-16: 40 mg via INTRAVENOUS
  Filled 2023-06-16: qty 10

## 2023-06-16 MED ORDER — METOPROLOL SUCCINATE ER 100 MG PO TB24
100.0000 mg | ORAL_TABLET | Freq: Every morning | ORAL | Status: DC
  Administered 2023-06-16 – 2023-06-19 (×3): 100 mg via ORAL
  Filled 2023-06-16 (×2): qty 2
  Filled 2023-06-16: qty 1

## 2023-06-16 MED ORDER — VITAMIN B-12 1000 MCG PO TABS
1000.0000 ug | ORAL_TABLET | Freq: Every day | ORAL | Status: DC
  Administered 2023-06-16 – 2023-06-20 (×3): 1000 ug via ORAL
  Filled 2023-06-16: qty 1
  Filled 2023-06-16: qty 2
  Filled 2023-06-16: qty 1

## 2023-06-16 MED ORDER — ATORVASTATIN CALCIUM 20 MG PO TABS
20.0000 mg | ORAL_TABLET | Freq: Every day | ORAL | Status: DC
  Administered 2023-06-16 – 2023-06-20 (×4): 20 mg via ORAL
  Filled 2023-06-16 (×4): qty 1

## 2023-06-16 MED ORDER — HYDRALAZINE HCL 20 MG/ML IJ SOLN: 5.0000 mg | Freq: Four times a day (QID) | INTRAMUSCULAR | Status: DC | PRN

## 2023-06-16 MED ORDER — PANTOPRAZOLE SODIUM 40 MG IV SOLR
40.0000 mg | Freq: Once | INTRAVENOUS | Status: AC
Start: 2023-06-16 — End: 2023-06-16
  Administered 2023-06-16: 40 mg via INTRAVENOUS
  Filled 2023-06-16: qty 10

## 2023-06-16 MED ORDER — ONDANSETRON HCL 4 MG PO TABS
4.0000 mg | ORAL_TABLET | Freq: Four times a day (QID) | ORAL | Status: AC | PRN
Start: 2023-06-16 — End: 2023-06-21

## 2023-06-16 MED ORDER — LORAZEPAM 2 MG/ML IJ SOLN: 1.0000 mg | INTRAMUSCULAR | Status: DC | PRN

## 2023-06-16 MED ORDER — ONDANSETRON HCL 4 MG/2ML IJ SOLN
4.0000 mg | Freq: Four times a day (QID) | INTRAMUSCULAR | Status: AC | PRN
  Administered 2023-06-17 – 2023-06-19 (×2): 4 mg via INTRAVENOUS
  Filled 2023-06-16 (×2): qty 2

## 2023-06-16 MED ORDER — SODIUM CHLORIDE 0.9 % IV SOLN
10.0000 mL/h | Freq: Once | INTRAVENOUS | Status: AC
  Administered 2023-06-16: 10 mL/h via INTRAVENOUS

## 2023-06-16 MED ORDER — SENNOSIDES-DOCUSATE SODIUM 8.6-50 MG PO TABS: 1.0000 | ORAL_TABLET | Freq: Every evening | ORAL | Status: DC | PRN

## 2023-06-16 MED ORDER — ACETAMINOPHEN 325 MG PO TABS: 650.0000 mg | ORAL_TABLET | Freq: Four times a day (QID) | ORAL | Status: AC | PRN

## 2023-06-16 MED ORDER — OXYBUTYNIN CHLORIDE 5 MG PO TABS: 5.0000 mg | ORAL_TABLET | Freq: Three times a day (TID) | ORAL | Status: DC | PRN

## 2023-06-16 MED ORDER — PANTOPRAZOLE SODIUM 40 MG IV SOLR
40.0000 mg | Freq: Two times a day (BID) | INTRAVENOUS | Status: DC
  Administered 2023-06-17 – 2023-06-21 (×9): 40 mg via INTRAVENOUS
  Filled 2023-06-16 (×9): qty 10

## 2023-06-16 MED ORDER — VITAMIN C 500 MG PO TABS
500.0000 mg | ORAL_TABLET | Freq: Every day | ORAL | Status: DC
  Administered 2023-06-16 – 2023-06-20 (×4): 500 mg via ORAL
  Filled 2023-06-16 (×4): qty 1

## 2023-06-16 NOTE — Progress Notes (Signed)
[facility]ist Note  Received message from nursing staff that patient's heart rate has been in the 120s to 130s for approximately 10 minutes after blood transfusion completion.  Patient reports that he had slipped down and was trying to push himself back up and was struggling to push himself up straight.  Physical Exam  At bedside, patient was awake alert and oriented to self, age, location, current, year.  Current vitals at bedside were: Afebrile, heart rate 119, blood pressure 130/72, respiration rate of 18, SpO2 of 99-100 show on room air.  Pulmonary: Bilateral lungs were auscultated, clear to auscultation bilaterally, negative for wheezing, crackles.  No accessory muscle use. He is reading a novel. Cardiovascular: Tachycardia, regular rhythm.  Lower extremities was negative for swelling. GU: Suprapubic tenderness persistent  # Sinus tachycardia-low clinical suspicion for volume overload or transfusion related reaction as patient does not appear to be in acute distress - Resumed home metoprolol 100 mg daily (patient missed last two doses per spouse) - However given suprapubic tenderness and recent cystoscopy for balloon dilatation of urethral stricture (11/50 and patient still not able to produce urine since being in the ED/admitted to the hospital service, bladder scan has been ordered. - UA ordered and pending collection at this time - Bladder scan has been ordered - Patient can be initiated on antibiotic should UA shows signs of infection such as positive for leukocytes and or nitrates - Metoprolol tartrate 5 mg IV every 4 hours as needed for heart rate greater than 120, 3 doses ordered with instructions to give for heart rate greater than 120 after receiving home metoprolol succinate  Dr. Sedalia Muta               CRITICAL CARE Performed by: Dr. Sedalia Muta  Total critical care time: 32 minutes  Critical care time was exclusive of separately billable procedures and treating  other patients.  Critical care was necessary to treat or prevent imminent or life-threatening deterioration.  Critical care was time spent personally by me on the following activities: development of treatment plan with patient and/or surrogate as well as nursing, discussions with consultants, evaluation of patient's response to treatment, examination of patient, obtaining history from patient or surrogate, ordering and performing treatments and interventions, ordering and review of laboratory studies, ordering and review of radiographic studies, pulse oximetry and re-evaluation of patient's condition.

## 2023-06-16 NOTE — ED Notes (Signed)
Type & screen resent.

## 2023-06-16 NOTE — Assessment & Plan Note (Signed)
The patient denies history of delirium tremens, or tremors.  He reports it has been 2 years since he has been without alcohol. Ativan 1 mg IV as needed for anxiety, 3 doses ordered with instructions to administer as appropriate and then let provider know Counseling given for alcohol progressive cessation Patient endorses understanding and compliance

## 2023-06-16 NOTE — Consult Note (Signed)
[facility] GI Inpatient Consult Note   Jamey Reas, M.D.  Reason for Consult: Melena, acute blood loss anemia   Attending Requesting Consult: Amy Cox, D.O.  Outpatient Primary Physician: Daniel Nones III, M.D.  History of Present Illness: Harry Mileto. is a 77 y.o. male history of hypertension left bundle branch block, CKD stage III, Barrett's esophagus and GERD who presents to the emergency room with 2 days of melena.  Patient had noted significant lightheadedness and dizziness.  He was having his wife check his blood pressure which was mildly elevated, they called  Dr. Graciela Husbands for a losartan refill. He then had an episode of constipation yesterday evening, a firm brown stool. This morning, however, he reports three large "black" tarry bowel movements that he said smelled so bad, the cat had to leave the bathroom. He denies chest pain, headache, syncopal episodes, abdominal pain. He feels better currently while receiving a blood transfusion. Patient's last EGD and colonoscopy were performed on 01/16/23 by Dr. Merlyn Lot revealing Barrett's esophagus, sigmoid diverticulosis and internal hemorrhoids. Patient reports no history of Rx anticoagulation such as eliquis, coumadin, plavix, etc.   Past Medical History:  Past Medical History:  Diagnosis Date   Adenomatous colon polyp    Allergic rhinitis    Arthritis    Avascular necrosis of bones of both hips (HCC)    B12 deficiency    Barrett's esophagus without dysplasia    Bilateral inguinal hernia    Bilateral renal cysts    CAD (coronary artery disease)    Chronic prostatitis    DDD (degenerative disc disease), lumbar    Enlarged prostate    Erectile dysfunction    a.) on PDE5i (sildenafil)   GERD (gastroesophageal reflux disease)    History of bilateral cataract extraction 2020   HLD (hyperlipidemia)    HTN (hypertension)    Hypertension    LBBB (left bundle branch block)    Leukopenia    Long term  current use of aspirin    OSA on CPAP    Peripheral neuropathy    Prediabetes    Skin cancer    Substance abuse (HCC)    Vitamin D deficiency     Problem List: Patient Active Problem List   Diagnosis Date Noted   Upper GI bleed 06/16/2023   Allergic rhinitis 04/26/2018   B12 deficiency 04/26/2018   Leukopenia 04/26/2018   Barrett's esophagus without dysplasia 03/23/2017   Polyp of colon, adenomatous 03/23/2017   Aortic atherosclerosis (HCC) 03/01/2017   Avascular necrosis of bones of both hips (HCC) 03/01/2017   History of nonmelanoma skin cancer 06/14/2015   Polyneuropathy 10/29/2014   Essential hypertension 06/16/2014   Pure hypercholesterolemia 06/16/2014   LBBB (left bundle branch block) 03/19/2014   Benign prostatic hyperplasia without lower urinary tract symptoms 01/08/2014   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   Erectile dysfunction 01/08/2014   Other specified disorders of urethra 01/08/2014   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   Psychosexual dysfunction with inhibited sexual excitement 01/08/2014    Past Surgical History: Past Surgical History:  Procedure Laterality Date   APPENDECTOMY     BIOPSY  01/16/2023   Procedure: BIOPSY;  Surgeon: Regis Bill, MD;  Location: [facility] ENDOSCOPY;  Service: Endoscopy;;   CATARACT EXTRACTION W/PHACO Right 06/11/2019   Procedure: CATARACT EXTRACTION PHACO AND INTRAOCULAR LENS PLACEMENT (IOC) RIGHT 00:54.6     22.0%    12.00;  Surgeon: Lockie Mola, MD;  Location: [facility];  Service: Ophthalmology;  Laterality: Right;  sleep apnea   CATARACT EXTRACTION W/PHACO Left 06/30/2019   Procedure: CATARACT EXTRACTION PHACO AND INTRAOCULAR LENS PLACEMENT (IOC) LEFT TORIC LENS 9.97  01:09.1  14.4%;  Surgeon: Lockie Mola, MD;  Location: [facility];  Service: Ophthalmology;  Laterality: Left;   COLONOSCOPY W/ POLYPECTOMY     02/08/00,11'12'04,05/05/05, 08/21/2008, 12/05/2013, 05/08/2014   COLONOSCOPY  WITH PROPOFOL N/A 06/06/2017   Procedure: COLONOSCOPY WITH PROPOFOL;  Surgeon: Scot Jun, MD;  Location: [facility] ENDOSCOPY;  Service: Endoscopy;  Laterality: N/A;   COLONOSCOPY WITH PROPOFOL N/A 01/16/2023   Procedure: COLONOSCOPY WITH PROPOFOL;  Surgeon: Regis Bill, MD;  Location: [facility] ENDOSCOPY;  Service: Endoscopy;  Laterality: N/A;   cyst removed  Right    thumb   CYSTOSCOPY WITH URETHRAL DILATATION N/A 06/05/2023   Procedure: CYSTOSCOPY WITH URETHRAL BALLOON DILATATION USING OPTILUME;  Surgeon: Riki Altes, MD;  Location: [facility] ORS;  Service: Urology;  Laterality: N/A;   ESOPHAGOGASTRODUODENOSCOPY     x7   ESOPHAGOGASTRODUODENOSCOPY (EGD) WITH PROPOFOL N/A 06/06/2017   Procedure: ESOPHAGOGASTRODUODENOSCOPY (EGD) WITH PROPOFOL;  Surgeon: Scot Jun, MD;  Location: [facility] ENDOSCOPY;  Service: Endoscopy;  Laterality: N/A;   ESOPHAGOGASTRODUODENOSCOPY (EGD) WITH PROPOFOL N/A 11/27/2019   Procedure: ESOPHAGOGASTRODUODENOSCOPY (EGD) WITH PROPOFOL;  Surgeon: Roemello Speyer, Boykin Nearing, MD;  Location: [facility] ENDOSCOPY;  Service: Gastroenterology;  Laterality: N/A;   ESOPHAGOGASTRODUODENOSCOPY (EGD) WITH PROPOFOL N/A 01/16/2023   Procedure: ESOPHAGOGASTRODUODENOSCOPY (EGD) WITH PROPOFOL;  Surgeon: Regis Bill, MD;  Location: [facility] ENDOSCOPY;  Service: Endoscopy;  Laterality: N/A;   HEMORRHOID SURGERY     TONSILLECTOMY      Allergies: Allergies  Allergen Reactions   Amoxicillin Other (See Comments)    Oral thrush   Other Swelling    Pneumonia vaccine-swollen/painful/fever chills   Amlodipine Other (See Comments)    EDEMA    Home Medications: (Not in a hospital admission)  Home medication reconciliation was completed with the patient.   Scheduled Inpatient Medications:    pantoprazole (PROTONIX) IV  40 mg Intravenous Q5 min   Followed by   Melene Muller ON 06/17/2023] pantoprazole (PROTONIX) IV  40 mg Intravenous Q12H    Continuous Inpatient Infusions:    sodium  chloride      PRN Inpatient Medications:  acetaminophen **OR** acetaminophen, ondansetron **OR** ondansetron (ZOFRAN) IV, senna-docusate  Family History: family history includes Heart disease in his mother.   GI Family History: Negative  Social History:   reports that he has never smoked. He has never used smokeless tobacco. He reports current alcohol use of about 2.0 standard drinks of alcohol per week. He reports that he does not use drugs. The patient denies ETOH, tobacco, or drug use.    Review of Systems: Review of Systems - Negative except HPI  Physical Examination: BP (!) 127/58   Pulse 96   Temp 98.3 F (36.8 C) (Oral)   Resp 19   Ht 5\' 8"  (1.727 m)   Wt 86.2 kg   SpO2 98%   BMI 28.89 kg/m  Physical Exam Constitutional:      General: He is not in acute distress.    Appearance: He is not ill-appearing or diaphoretic.  Eyes:     General: No scleral icterus.    Extraocular Movements: Extraocular movements intact.     Pupils: Pupils are equal, round, and reactive to light.  Cardiovascular:     Rate and Rhythm: Normal rate.     Pulses: Normal pulses.  Pulmonary:     Effort: Pulmonary  effort is normal. No respiratory distress.     Breath sounds: No stridor. No wheezing or rhonchi.  Chest:     Chest wall: No tenderness.  Abdominal:     Palpations: Abdomen is soft.     Data: Lab Results  Component Value Date   WBC 9.3 06/16/2023   HGB 7.5 (L) 06/16/2023   HCT 22.3 (L) 06/16/2023   MCV 102.8 (H) 06/16/2023   PLT 217 06/16/2023   Recent Labs  Lab 06/16/23 1223  HGB 7.5*   Lab Results  Component Value Date   NA 138 06/16/2023   K 4.3 06/16/2023   CL 110 06/16/2023   CO2 21 (L) 06/16/2023   BUN 83 (H) 06/16/2023   CREATININE 1.52 (H) 06/16/2023   No results found for: "ALT", "AST", "GGT", "ALKPHOS", "BILITOT" Recent Labs  Lab 06/16/23 1411  APTT 22*  INR 1.2      Latest Ref Rng & Units 06/16/2023   12:23 PM  CBC  WBC 4.0 - 10.5 K/uL 9.3    Hemoglobin 13.0 - 17.0 g/dL 7.5   Hematocrit 69.6 - 52.0 % 22.3   Platelets 150 - 400 K/uL 217     STUDIES: No results found. @IMAGES @  Assessment:  Melena - Likely UGI source given clinical presentation. Ddx includes PUD, GAVE, less likely meckel's diverticulum, small bowel source. Hx of Cox-2 inhibitor usage (Celebrex). He stopped it coincidentally 2 weeks ago. Hx Barrett's esophagus without dysplasia - EGD January 16, 2023 - Dr. Mia Creek. GERD, stable. Left bundle branch block. Hypertension - Blood pressure controlled. Anemia secondary to gastrointestinal blood loss. Hgb 7.5, undergoing blood transfusion presently. F/E/N - Tolerating clear liquid diet.     Recommendations:  IV protonix as you are doing. Clears ok for now. NPO after MN. Serial H/H. EGD in AM. The patient understands the nature of the planned procedure, indications, risks, alternatives and potential complications including but not limited to bleeding, infection, perforation, damage to internal organs and possible oversedation/side effects from anesthesia. The patient agrees and gives consent to proceed.  Please refer to procedure notes for findings, recommendations and patient disposition/instructions.   Thank you for the consult. Please call with questions or concerns.  Rosina Lowenstein, "Harry Dance MD [facility] [address] [place], [place] [address] [phone]  06/16/2023 3:54 PM

## 2023-06-16 NOTE — Assessment & Plan Note (Addendum)
Concerns for upper GI bleed, treat per above

## 2023-06-16 NOTE — ED Provider Notes (Signed)
[facility] Provider Note    Event Date/Time   First MD Initiated Contact with Patient 06/16/23 1342     (approximate)   History   Dizziness   HPI  Harry Patterson. is a 77 year old male presenting to the emergency department for evaluation of lightheadedness ongoing for the past few days.  No syncope.  His wife has been checking his blood pressure at home and noticed that it was low earlier today at 90/40.  This morning had an episode of black, "tarry" stool followed by 2 more episodes of this later today.  Denies any noted blood in stool prior to this, but does report a history of internal hemorrhoids.  Takes 81 mg aspirin, has previously been on Celebrex.  No fevers.  Does feel short of breath.  Does report a history of a colonoscopy about 6 months ago.  Noted to have Barrett's esophagus and esophageal diverticulum at that time.     Physical Exam   Triage Vital Signs: ED Triage Vitals  Encounter Vitals Group     BP 06/16/23 1219 109/61     Systolic BP Percentile --      Diastolic BP Percentile --      Pulse Rate 06/16/23 1219 (!) 115     Resp 06/16/23 1219 16     Temp 06/16/23 1219 97.9 F (36.6 C)     Temp Source 06/16/23 1219 Oral     SpO2 06/16/23 1218 97 %     Weight 06/16/23 1220 190 lb (86.2 kg)     Height 06/16/23 1220 5\' 8"  (1.727 m)     Head Circumference --      Peak Flow --      Pain Score 06/16/23 1219 0     Pain Loc --      Pain Education --      Exclude from Growth Chart --     Most recent vital signs: Vitals:   06/16/23 1219 06/16/23 1544  BP: 109/61 (!) 127/58  Pulse: (!) 115 96  Resp: 16 19  Temp: 97.9 F (36.6 C) 98.3 F (36.8 C)  SpO2: 96% 98%     General: Awake, interactive, appears pale CV:  Tachycardic with regular rhythm  Resp:  Lungs good auscultation, respirations unlabored Abd:  Nondistended, soft, nontender, black stool on rectal exam, readily Hemoccult positive  Neuro:  Symmetric facial  movement, fluid speech   ED Results / Procedures / Treatments   Labs (all labs ordered are listed, but only abnormal results are displayed) Labs Reviewed  BASIC METABOLIC PANEL - Abnormal; Notable for the following components:      Result Value   CO2 21 (*)    Glucose, Bld 140 (*)    BUN 83 (*)    Creatinine, Ser 1.52 (*)    GFR, Estimated 47 (*)    All other components within normal limits  CBC - Abnormal; Notable for the following components:   RBC 2.17 (*)    Hemoglobin 7.5 (*)    HCT 22.3 (*)    MCV 102.8 (*)    MCH 34.6 (*)    All other components within normal limits  APTT - Abnormal; Notable for the following components:   aPTT 22 (*)    All other components within normal limits  PROTIME-INR  URINALYSIS, ROUTINE W REFLEX MICROSCOPIC  PREPARE RBC (CROSSMATCH)  ABO/RH  TYPE AND SCREEN  TYPE AND SCREEN     EKG EKG independently reviewed interpreted by  myself (ER attending) demonstrates:  EKG demonstrates sinus tachycardia at a rate of 114, PR 156, QRS 146, QTc 496, left bundle branch block morphology noted without appreciable superimposed ischemia.  LBBB previously noted on 06/05/2023  RADIOLOGY Imaging independently reviewed and interpreted by myself demonstrates:    PROCEDURES:  Critical Care performed: Yes, see critical care procedure note(s)  CRITICAL CARE Performed by: Trinna Post   Total critical care time: 32 minutes  Critical care time was exclusive of separately billable procedures and treating other patients.  Critical care was necessary to treat or prevent imminent or life-threatening deterioration.  Critical care was time spent personally by me on the following activities: development of treatment plan with patient and/or surrogate as well as nursing, discussions with consultants, evaluation of patient's response to treatment, examination of patient, obtaining history from patient or surrogate, ordering and performing treatments and interventions,  ordering and review of laboratory studies, ordering and review of radiographic studies, pulse oximetry and re-evaluation of patient's condition.   Procedures   MEDICATIONS ORDERED IN ED: Medications  0.9 %  sodium chloride infusion (has no administration in time range)  acetaminophen (TYLENOL) tablet 650 mg (has no administration in time range)    Or  acetaminophen (TYLENOL) suppository 650 mg (has no administration in time range)  ondansetron (ZOFRAN) tablet 4 mg (has no administration in time range)    Or  ondansetron (ZOFRAN) injection 4 mg (has no administration in time range)  senna-docusate (Senokot-S) tablet 1 tablet (has no administration in time range)  pantoprazole (PROTONIX) injection 40 mg (40 mg Intravenous Given 06/16/23 1452)  sodium chloride 0.9 % bolus 500 mL (0 mLs Intravenous Stopped 06/16/23 1513)     IMPRESSION / MDM / ASSESSMENT AND PLAN / ED COURSE  I reviewed the triage vital signs and the nursing notes.  Differential diagnosis includes, but is not limited to, acute GI bleed with possible upper GI bleed including ulcer, slow lower GI bleed including AVM, diverticulosis  Patient's presentation is most consistent with acute presentation with potential threat to life or bodily function.  77 year old male presenting to the emergency department for evaluation of lightheadedness in the setting of tarry stools.  Tachycardic on presentation here but fortunately not hypotensive. Labs demonstrate acute blood loss with hemoglobin of 7.5, was 12.4 on 06/11/2023.  Suspect likely ongoing blood loss with multiple tarry stools today, with this we will order a unit of blood.  With tachycardia we will go ahead and applied a small fluid bolus while awaiting blood.  IV Protonix ordered.  Do think patient is appropriate for admission.  Will reach out to hospitalist team.  Clinical Course as of 06/16/23 1546  Sat Jun 16, 2023  1535 Case discussed with Dr. Sedalia Muta.  She will evaluate the  patient for anticipated admission. [NR]    Clinical Course User Index [NR] Trinna Post, MD     FINAL CLINICAL IMPRESSION(S) / ED DIAGNOSES   Final diagnoses:  Gastrointestinal hemorrhage, unspecified gastrointestinal hemorrhage type  Acute blood loss anemia     Rx / DC Orders   ED Discharge Orders     None        Note:  This document was prepared using Dragon voice recognition software and may include unintentional dictation errors.   Trinna Post, MD 06/16/23 [phone]

## 2023-06-16 NOTE — Hospital Course (Addendum)
Mr. Harry Patterson is a 77 year old male with history of hypertension, hyperlipidemia, GERD, history of CKD stage IIIb, neuropathy, history of stricture of bulbous urethra who presents emergency department from [facility] clinic for chief concerns of low blood pressure and dizziness.  Vitals in the ED showed temperature of 97.9, respiration rate of 16, heart rate of 115, blood pressure 109/61, SpO2 of 96% on room air.  Serum sodium is 138, potassium 4.3, chloride 110, bicarb 21, BUN of 88, serum creatinine 1.52, eGFR 47, nonfasting blood glucose 140, WBC 9.3, hemoglobin 7.5, platelets of 217.  ED treatment: 1 unit PRBC, Protonix 40 mg IV one-time dose, sodium chloride 500 mL bolus.

## 2023-06-16 NOTE — ED Notes (Signed)
Amy Cox, DO at bedside.

## 2023-06-16 NOTE — Assessment & Plan Note (Signed)
Home Flomax resumed for 11/17 Oxybutynin 5 mg every 8 hours as needed bladder spasms

## 2023-06-16 NOTE — Assessment & Plan Note (Addendum)
Home gabapentin 300 mg in the morning and in the evening not resumed on admission due to low normal blood pressure on admission due to concerns of upper GI bleed Discussed my plan of holding gabapentin with patient and spouse at bedside and they are in agreement with this PDMP reviewed

## 2023-06-16 NOTE — Assessment & Plan Note (Signed)
Home atorvastatin 20 mg nightly resumed

## 2023-06-16 NOTE — Assessment & Plan Note (Addendum)
Symptomatic anemia Hemoglobin at 7.1 after getting 2 unit of PRBC.  S/p EGD which shows chronic signs of Barrett's esophagus and no other obvious source of bleeding.  No biopsies were taken. GI is on board and patient is going for colonoscopy tomorrow and if no source found then he might need capsule endoscopy. -Ordered third unit of PRBC -Continue with IV Protonix -Continue to monitor

## 2023-06-16 NOTE — H&P (Addendum)
History and Physical   Harry Patterson. WUJ:[id number] DOB: [birth year] DOA: 06/16/2023  PCP: Lynnea Ferrier, MD  Outpatient Specialists: Dr. Dannette Barbara, urology Patient coming from: [facility] clinic  I have personally briefly reviewed patient's old medical records in [facility] EMR.  Chief Concern: Low blood pressure, dizziness  HPI: Mr. Harry Patterson is a 77 year old male with history of hypertension, hyperlipidemia, GERD, history of CKD stage IIIb, neuropathy, history of stricture of bulbous urethra who presents emergency department from [facility] clinic for chief concerns of low blood pressure and dizziness.  Vitals in the ED showed temperature of 97.9, respiration rate of 16, heart rate of 115, blood pressure 109/61, SpO2 of 96% on room air.  Serum sodium is 138, potassium 4.3, chloride 110, bicarb 21, BUN of 88, serum creatinine 1.52, eGFR 47, nonfasting blood glucose 140, WBC 9.3, hemoglobin 7.5, platelets of 217.  ED treatment: 1 unit PRBC, Protonix 40 mg IV one-time dose, sodium chloride 500 mL bolus. --------------------------------- At bedside, patient was able to tell me his name, age, location, current calendar year.  Patient reports he had 2 episodes of tarry stool today before coming to the hospital and 1 episodes of tarry stool while in the emergency department.  He endorses dysuria and suprapubic tenderness.  He denies chest pain, fever, chills, cough, nausea, vomiting, swelling of his legs.  He reports that he has been having shortness of breath especially when climbing up stairs over the last 3 to 4 days.  He has to climb 3 flights of stairs to get to his office.  Spouse at bedside reports that he has had a celecoxib 2-week break so his last dose of celecoxib was 2 weeks ago.  Patient endorses taking an aspirin 81 mg daily.  Social history: He lives at home with his wife of 18 years.  He denies tobacco and recreational drug use.  He drinks 2 scotches  per night.  ROS: Constitutional: no weight change, no fever ENT/Mouth: no sore throat, no rhinorrhea Eyes: no eye pain, no vision changes Cardiovascular: no chest pain, + dyspnea,  no edema, no palpitations Respiratory: no cough, no sputum, no wheezing Gastrointestinal: no nausea, no vomiting, no diarrhea, no constipation Genitourinary: no urinary incontinence, no dysuria, no hematuria Musculoskeletal: no arthralgias, no myalgias Skin: no skin lesions, no pruritus, Neuro: + weakness, no loss of consciousness, no syncope Psych: no anxiety, no depression, + decrease appetite Heme/Lymph: no bruising, no bleeding  ED Course: Discussed with EDP, patient requiring hospitalization for chief concerns of upper GI bleed.  Assessment/Plan  Principal Problem:   Upper GI bleed Active Problems:   Avascular necrosis of bones of both hips (HCC)   Benign prostatic hyperplasia without lower urinary tract symptoms   B12 deficiency   Erectile dysfunction   Essential hypertension   Barrett's esophagus without dysplasia   LBBB (left bundle branch block)   Polyneuropathy   Pure hypercholesterolemia   Alcohol use   Symptomatic anemia   Suprapubic tenderness   Assessment and Plan:  * Upper GI bleed Symptomatic anemia 1 unit PRBC have been ordered for transfusion, Protonix IV twice daily Nursing care/instruction: Please repeat H&H posttransfusion Goal hemoglobin greater than 8 PIV: Please ensure and maintain 2 peripheral IV in setting of possible upper GI bleed Clear liquid diet; n.p.o. after midnight Gastroenterology specialist, Dr. Norma Fredrickson has been consulted for consideration of upper endoscopy Admit to PCU, inpatient  Suprapubic tenderness Patient had cystoscopy on 11/5 Check a UA Per nursing, patient has not  produced a urine sample since being admitted Bladder scan ordered  Symptomatic anemia Concerns for upper GI bleed, treat per above  Alcohol use The patient denies history of  delirium tremens, or tremors.  He reports it has been 2 years since he has been without alcohol. Ativan 1 mg IV as needed for anxiety, 3 doses ordered with instructions to administer as appropriate and then let provider know Counseling given for alcohol progressive cessation Patient endorses understanding and compliance  Pure hypercholesterolemia Home atorvastatin 20 mg nightly resumed  Polyneuropathy Home gabapentin 300 mg in the morning and in the evening not resumed on admission due to low normal blood pressure on admission due to concerns of upper GI bleed Discussed my plan of holding gabapentin with patient and spouse at bedside and they are in agreement with this PDMP reviewed  Essential hypertension Home metoprolol succinate 100 mg every morning, losartan 100 mg p.o. twice daily, spironolactone 100 mg every morning, chlorthalidone 25 mg every morning were not resumed on admission due to low normal tensive blood pressure in setting of concerns for upper GI bleed Hydralazine 5 mg IV every 6 hours as needed for SBP greater 175, 7 days ordered  Benign prostatic hyperplasia without lower urinary tract symptoms Home Flomax resumed for 11/17 Oxybutynin 5 mg every 8 hours as needed bladder spasms  Chart reviewed.   DVT prophylaxis: TED hose Code Status: Full code Diet: Clear; n.p.o. after midnight Family Communication: Updated spouse at bedside Disposition Plan: Pending clinical course Consults called: Gastroenterology Admission status: PCU, inpatient  Past Medical History:  Diagnosis Date   Adenomatous colon polyp    Allergic rhinitis    Arthritis    Avascular necrosis of bones of both hips (HCC)    B12 deficiency    Barrett's esophagus without dysplasia    Bilateral inguinal hernia    Bilateral renal cysts    CAD (coronary artery disease)    Chronic prostatitis    DDD (degenerative disc disease), lumbar    Enlarged prostate    Erectile dysfunction    a.) on PDE5i  (sildenafil)   GERD (gastroesophageal reflux disease)    History of bilateral cataract extraction 2020   HLD (hyperlipidemia)    HTN (hypertension)    Hypertension    LBBB (left bundle branch block)    Leukopenia    Long term current use of aspirin    OSA on CPAP    Peripheral neuropathy    Prediabetes    Skin cancer    Substance abuse (HCC)    Vitamin D deficiency    Past Surgical History:  Procedure Laterality Date   APPENDECTOMY     BIOPSY  01/16/2023   Procedure: BIOPSY;  Surgeon: Regis Bill, MD;  Location: [facility] ENDOSCOPY;  Service: Endoscopy;;   CATARACT EXTRACTION W/PHACO Right 06/11/2019   Procedure: CATARACT EXTRACTION PHACO AND INTRAOCULAR LENS PLACEMENT (IOC) RIGHT 00:54.6     22.0%    12.00;  Surgeon: Lockie Mola, MD;  Location: [facility];  Service: Ophthalmology;  Laterality: Right;  sleep apnea   CATARACT EXTRACTION W/PHACO Left 06/30/2019   Procedure: CATARACT EXTRACTION PHACO AND INTRAOCULAR LENS PLACEMENT (IOC) LEFT TORIC LENS 9.97  01:09.1  14.4%;  Surgeon: Lockie Mola, MD;  Location: [facility];  Service: Ophthalmology;  Laterality: Left;   COLONOSCOPY W/ POLYPECTOMY     02/08/00,11'12'04,05/05/05, 08/21/2008, 12/05/2013, 05/08/2014   COLONOSCOPY WITH PROPOFOL N/A 06/06/2017   Procedure: COLONOSCOPY WITH PROPOFOL;  Surgeon: Scot Jun, MD;  Location: [facility] ENDOSCOPY;  Service: Endoscopy;  Laterality: N/A;   COLONOSCOPY WITH PROPOFOL N/A 01/16/2023   Procedure: COLONOSCOPY WITH PROPOFOL;  Surgeon: Regis Bill, MD;  Location: [facility] END[facility]COPY;  Service: Endoscopy;  Laterality: N/A;   cyst removed  Right    thumb   CYSTOSCOPY WITH URETHRAL DILATATION N/A 06/05/2023   Procedure: CYSTOSCOPY WITH URETHRAL BALLOON DILATATION USING OPTILUME;  Surgeon: Riki Altes, MD;  Location: [facility];  Service: Urology;  Laterality: N/A;   ESOPHAGOGASTRODUODENOSCOPY     x7   ESOPHAGOGASTRODUODENOSCOPY (EGD) WITH PROPOFOL  N/A 06/06/2017   Procedure: ESOPHAGOGASTRODUODENOSCOPY (EGD) WITH PROPOFOL;  Surgeon: Scot Jun, MD;  Location: [facility];  Service: Endoscopy;  Laterality: N/A;   ESOPHAGOGASTRODUODENOSCOPY (EGD) WITH PROPOFOL N/A 11/27/2019   Procedure: ESOPHAGOGASTRODUODENOSCOPY (EGD) WITH PROPOFOL;  Surgeon: Toledo, Boykin Nearing, MD;  Location: [facility] END[facility]COPY;  Service: Gastroenterology;  Laterality: N/A;   ESOPHAGOGASTRODUODENOSCOPY (EGD) WITH PROPOFOL N/A 01/16/2023   Procedure: ESOPHAGOGASTRODUODENOSCOPY (EGD) WITH PROPOFOL;  Surgeon: Regis Bill, MD;  Location: [facility];  Service: Endoscopy;  Laterality: N/A;   HEMORRHOID SURGERY     TONSILLECTOMY     Social History:  reports that he has never smoked. He has never used smokeless tobacco. He reports current alcohol use of about 2.0 standard drinks of alcohol per week. He reports that he does not use drugs.  Allergies  Allergen Reactions   Amoxicillin Other (See Comments)    Oral thrush   Other Swelling    Pneumonia vaccine-swollen/painful/fever chills   Amlodipine Other (See Comments)    EDEMA   Family History  Problem Relation Age of Onset   Heart disease Mother    Family history: Family history reviewed and not pertinent.  Prior to Admission medications   Medication Sig Start Date End Date Taking? Authorizing Provider  acetaminophen (TYLENOL) 500 MG tablet Take 1,000 mg by mouth in the morning and at bedtime.    [provider]  ALPHA LIPOIC ACID PO Take 1 capsule by mouth in the morning.    [provider]  aspirin EC 81 MG tablet Take 81 mg by mouth at bedtime.    [provider]  atorvastatin (LIPITOR) 20 MG tablet Take 20 mg by mouth at bedtime. 04/15/18   [provider]  celecoxib (CELEBREX) 200 MG capsule Take 200 mg by mouth at bedtime. 04/20/18   [provider]  chlorthalidone (HYGROTON) 25 MG tablet Take 25 mg by mouth in the morning. 03/08/23 03/07/24  [provider]  Cholecalciferol 1000 units capsule Take 2,000 Units by mouth at bedtime.    [provider]  diphenoxylate-atropine (LOMOTIL) 2.5-0.025 MG tablet Take 1 tablet by mouth 4 (four) times daily as needed for diarrhea or loose stools.    [provider]  folic acid (FOLVITE) 1 MG tablet Take 2 mg by mouth in the morning.    [provider]  gabapentin (NEURONTIN) 300 MG capsule Take 300 mg by mouth in the morning and at bedtime.    [provider]  losartan (COZAAR) 100 MG tablet Take 50 mg by mouth 2 (two) times daily.    [provider]  metoprolol succinate (TOPROL-XL) 100 MG 24 hr tablet Take 100 mg by mouth in the morning.    [provider]  omega-3 acid ethyl esters (LOVAZA) 1 g capsule Take 1 g by mouth in the morning and at bedtime. 02/02/18   [provider]  oxybutynin (DITROPAN) 5 MG tablet 1 tab  tid prn frequency,urgency, bladder spasm 06/05/23   Stoioff, Verna Czech, MD  pantoprazole (PROTONIX) 40 MG tablet Take 40 mg by mouth at bedtime.    [provider]  spironolactone (ALDACTONE) 100 MG tablet Take 100 mg by mouth in the morning.    [provider]  tamsulosin (FLOMAX) 0.4 MG CAPS capsule Take 1 capsule (0.4 mg total) by mouth daily. 04/26/18   Stoioff, Verna Czech, MD  triamcinolone ointment (KENALOG) 0.1 % Apply 1 Application topically 2 (two) times daily as needed (leg irritation/rash.). 06/05/17   [provider]  vitamin B-12 (CYANOCOBALAMIN) 1000 MCG tablet Take 1,000 mcg by mouth at bedtime.    [provider]  vitamin C (ASCORBIC ACID) 500 MG tablet Take 500 mg by mouth at bedtime.    [provider]   Physical Exam: Vitals:   06/16/23 1820 06/16/23 1821 06/16/23 1822 06/16/23 1823  BP:      Pulse: (!) 113 (!) 116 (!) 127 (!) 131  Resp:      Temp:      TempSrc:      SpO2:      Weight:      Height:       Constitutional: appears age-appropriate, NAD ,  calm Eyes: PERRL, lids and conjunctivae normal ENMT: Mucous membranes are moist. Posterior pharynx clear of any exudate or lesions. Age-appropriate dentition. Hearing appropriate   Neck: normal, supple, no masses, no thyromegaly Respiratory: clear to auscultation bilaterally, no wheezing, no crackles. Normal respiratory effort. No accessory muscle use.  Cardiovascular: Regular rate and rhythm, no murmurs / rubs / gallops. No extremity edema. 2+ pedal pulses. No carotid bruits.  Abdomen: + Suprapubic tenderness, no masses palpated, no hepatosplenomegaly. Bowel sounds positive.  Musculoskeletal: no clubbing / cyanosis. No joint deformity upper and lower extremities. Good ROM, no contractures, no atrophy. Normal muscle tone.  Skin: no rashes, lesions, ulcers. No induration.  Skin is pale Neurologic: Sensation intact. Strength 5/5 in all 4.  Psychiatric: Normal judgment and insight. Alert and oriented x 3. Normal mood.   EKG: independently reviewed, showing sinus tachycardia with rate of 114, left bundle branch block, QTc 496   Chest x-ray on Admission: I personally reviewed and chest x-ray is negative for radiologic evidence of acute cardiopulmonary process.  DG Chest Port 1 View  Result Date: 06/16/2023 CLINICAL DATA:  Dyspnea on exertion EXAM: PORTABLE CHEST 1 VIEW COMPARISON:  02/09/2017 FINDINGS: Single frontal view of the chest demonstrates an unremarkable cardiac silhouette. No airspace disease, effusion, or pneumothorax. No acute bony abnormalities. IMPRESSION: 1. No acute intrathoracic process. Electronically Signed   By: Sharlet Salina M.D.   On: 06/16/2023 17:57    Labs on Admission: I have personally reviewed following labs  CBC: Recent Labs  Lab 06/16/23 1223  WBC 9.3  HGB 7.5*  HCT 22.3*  MCV 102.8*  PLT 217   Basic Metabolic Panel: Recent Labs  Lab 06/16/23 1223  NA 138  K 4.3  CL 110  CO2 21*  GLUCOSE 140*  BUN 83*  CREATININE 1.52*  CALCIUM 9.0    GFR: Estimated Creatinine Clearance: 44.2 mL/min (A) (by C-G formula based on SCr of 1.52 mg/dL (H)).  Coagulation Profile: Recent Labs  Lab 06/16/23 1411  INR 1.2   Urine analysis:    Component Value Date/Time   APPEARANCEUR Clear 05/25/2023 0811   GLUCOSEU Negative 05/25/2023 0811   BILIRUBINUR Negative 05/25/2023 0811   PROTEINUR Trace 05/25/2023 0811   NITRITE Negative 05/25/2023 1610  LEUKOCYTESUR Negative 05/25/2023 3086   This document was prepared using Dragon Voice Recognition software and may include unintentional dictation errors.  Dr. Sedalia Muta [facility]  If 7PM-7AM, please contact overnight-coverage provider If 7AM-7PM, please contact day attending provider [email or web address]  06/16/2023, 6:48 PM

## 2023-06-16 NOTE — Assessment & Plan Note (Signed)
Home metoprolol succinate 100 mg every morning, losartan 100 mg p.o. twice daily, spironolactone 100 mg every morning, chlorthalidone 25 mg every morning were not resumed on admission due to low normal tensive blood pressure in setting of concerns for upper GI bleed Hydralazine 5 mg IV every 6 hours as needed for SBP greater 175, 7 days ordered

## 2023-06-16 NOTE — ED Triage Notes (Signed)
Pt arrives from [facility]. Pt reports having low BP and having dizziness. Pt had BM in the morning and reports "tarry stool" Pt reports internal hemorrhoids and takes an 81 mg aspirin. Pt reports fogginess. Pt had a urology procedure 2 weeks ago.

## 2023-06-16 NOTE — Assessment & Plan Note (Signed)
Patient had cystoscopy with balloon urethral dilatation on 11/5 UA with no concern of UTI.

## 2023-06-16 NOTE — ED Notes (Signed)
Dr Sedalia Muta at bedside again.

## 2023-06-17 ENCOUNTER — Inpatient Hospital Stay: Payer: BC Managed Care – PPO | Admitting: Anesthesiology

## 2023-06-17 ENCOUNTER — Encounter
Admission: EM | Disposition: A | Discharge status: Other Acute Inpatient Hospital | Payer: Self-pay | Source: Home / Self Care | Attending: Internal Medicine

## 2023-06-17 DIAGNOSIS — I10 Essential (primary) hypertension: Secondary | ICD-10-CM

## 2023-06-17 DIAGNOSIS — Z789 Other specified health status: Secondary | ICD-10-CM

## 2023-06-17 DIAGNOSIS — N400 Enlarged prostate without lower urinary tract symptoms: Secondary | ICD-10-CM

## 2023-06-17 DIAGNOSIS — R10819 Abdominal tenderness, unspecified site: Secondary | ICD-10-CM | POA: Diagnosis not present

## 2023-06-17 DIAGNOSIS — K922 Gastrointestinal hemorrhage, unspecified: Secondary | ICD-10-CM | POA: Diagnosis not present

## 2023-06-17 DIAGNOSIS — G629 Polyneuropathy, unspecified: Secondary | ICD-10-CM

## 2023-06-17 DIAGNOSIS — D649 Anemia, unspecified: Secondary | ICD-10-CM

## 2023-06-17 DIAGNOSIS — E7800 Pure hypercholesterolemia, unspecified: Secondary | ICD-10-CM

## 2023-06-17 DIAGNOSIS — K2270 Barrett's esophagus without dysplasia: Secondary | ICD-10-CM

## 2023-06-17 DIAGNOSIS — E538 Deficiency of other specified B group vitamins: Secondary | ICD-10-CM

## 2023-06-17 HISTORY — PX: ESOPHAGOGASTRODUODENOSCOPY: SHX5428

## 2023-06-17 LAB — BASIC METABOLIC PANEL
Anion gap: 12 (ref 5–15)
Anion gap: 10 (ref 5–15)
BUN: 66 mg/dL — ABNORMAL HIGH (ref 8–23)
BUN: 62 mg/dL — ABNORMAL HIGH (ref 8–23)
CO2: 14 mmol/L — ABNORMAL LOW (ref 22–32)
CO2: 20 mmol/L — ABNORMAL LOW (ref 22–32)
Calcium: 8.2 mg/dL — ABNORMAL LOW (ref 8.9–10.3)
Calcium: 8.1 mg/dL — ABNORMAL LOW (ref 8.9–10.3)
Chloride: 110 mmol/L (ref 98–111)
Chloride: 107 mmol/L (ref 98–111)
Creatinine, Ser: 1.29 mg/dL — ABNORMAL HIGH (ref 0.61–1.24)
Creatinine, Ser: 1.35 mg/dL — ABNORMAL HIGH (ref 0.61–1.24)
GFR, Estimated: 57 mL/min — ABNORMAL LOW (ref >60)
GFR, Estimated: 54 mL/min — ABNORMAL LOW (ref >60)
Glucose, Bld: 185 mg/dL — ABNORMAL HIGH (ref 70–99)
Glucose, Bld: 194 mg/dL — ABNORMAL HIGH (ref 70–99)
Potassium: 4.1 mmol/L (ref 3.5–5.1)
Potassium: 4.1 mmol/L (ref 3.5–5.1)
Sodium: 136 mmol/L (ref 135–145)
Sodium: 137 mmol/L (ref 135–145)

## 2023-06-17 LAB — CBC
HCT: 19.7 % — ABNORMAL LOW (ref 39.0–52.0)
HCT: 21.6 % — ABNORMAL LOW (ref 39.0–52.0)
Hemoglobin: 6.7 g/dL — ABNORMAL LOW (ref 13.0–17.0)
Hemoglobin: 7.4 g/dL — ABNORMAL LOW (ref 13.0–17.0)
MCH: 33.8 pg (ref 26.0–34.0)
MCH: 34.1 pg — ABNORMAL HIGH (ref 26.0–34.0)
MCHC: 34 g/dL (ref 30.0–36.0)
MCHC: 34.3 g/dL (ref 30.0–36.0)
MCV: 99.5 fL (ref 80.0–100.0)
MCV: 99.5 fL (ref 80.0–100.0)
Platelets: 177 10*3/uL (ref 150–400)
Platelets: 171 10*3/uL (ref 150–400)
RBC: 1.98 MIL/uL — ABNORMAL LOW (ref 4.22–5.81)
RBC: 2.17 MIL/uL — ABNORMAL LOW (ref 4.22–5.81)
RDW: 13.3 % (ref 11.5–15.5)
RDW: 14.2 % (ref 11.5–15.5)
WBC: 10.5 10*3/uL (ref 4.0–10.5)
WBC: 11.9 10*3/uL — ABNORMAL HIGH (ref 4.0–10.5)
nRBC: 0.9 % — ABNORMAL HIGH (ref 0.0–0.2)
nRBC: 0.9 % — ABNORMAL HIGH (ref 0.0–0.2)

## 2023-06-17 LAB — HEMOGLOBIN AND HEMATOCRIT, BLOOD
HCT: 22.9 % — ABNORMAL LOW (ref 39.0–52.0)
HCT: 23 % — ABNORMAL LOW (ref 39.0–52.0)
Hemoglobin: 7.9 g/dL — ABNORMAL LOW (ref 13.0–17.0)
Hemoglobin: 8.1 g/dL — ABNORMAL LOW (ref 13.0–17.0)

## 2023-06-17 LAB — PREPARE RBC (CROSSMATCH)

## 2023-06-17 LAB — CBG MONITORING, ED: Glucose-Capillary: 145 mg/dL — ABNORMAL HIGH (ref 70–99)

## 2023-06-17 SURGERY — EGD (ESOPHAGOGASTRODUODENOSCOPY)
Anesthesia: General

## 2023-06-17 MED ORDER — BISACODYL 5 MG PO TBEC
10.0000 mg | DELAYED_RELEASE_TABLET | Freq: Once | ORAL | Status: AC
  Administered 2023-06-17: 10 mg via ORAL
  Filled 2023-06-17: qty 2

## 2023-06-17 MED ORDER — LIDOCAINE HCL (CARDIAC) PF 100 MG/5ML IV SOSY
PREFILLED_SYRINGE | INTRAVENOUS | Status: DC | PRN
  Administered 2023-06-17: 80 mg via INTRAVENOUS

## 2023-06-17 MED ORDER — EPHEDRINE SULFATE-NACL 50-0.9 MG/10ML-% IV SOSY
PREFILLED_SYRINGE | INTRAVENOUS | Status: DC | PRN
  Administered 2023-06-17: 10 mg via INTRAVENOUS

## 2023-06-17 MED ORDER — SODIUM CHLORIDE 0.9 % IV SOLN: Freq: Once | INTRAVENOUS | Status: AC

## 2023-06-17 MED ORDER — DEXMEDETOMIDINE HCL IN NACL 80 MCG/20ML IV SOLN
INTRAVENOUS | Status: DC | PRN
  Administered 2023-06-17: 12 ug via INTRAVENOUS

## 2023-06-17 MED ORDER — SODIUM CHLORIDE 0.9% IV SOLUTION
Freq: Once | INTRAVENOUS | Status: AC
  Filled 2023-06-17: qty 250

## 2023-06-17 MED ORDER — PHENYLEPHRINE 80 MCG/ML (10ML) SYRINGE FOR IV PUSH (FOR BLOOD PRESSURE SUPPORT)
PREFILLED_SYRINGE | INTRAVENOUS | Status: DC | PRN
  Administered 2023-06-17 (×3): 160 ug via INTRAVENOUS

## 2023-06-17 MED ORDER — SODIUM CHLORIDE 0.9 % IV SOLN: INTRAVENOUS | Status: DC | PRN

## 2023-06-17 MED ORDER — PROPOFOL 10 MG/ML IV BOLUS
INTRAVENOUS | Status: DC | PRN
  Administered 2023-06-17: 70 mg via INTRAVENOUS
  Administered 2023-06-17: 10 mg via INTRAVENOUS

## 2023-06-17 MED ORDER — LACTATED RINGERS IV SOLN: INTRAVENOUS | Status: AC

## 2023-06-17 MED ORDER — PEG 3350-KCL-NA BICARB-NACL 420 G PO SOLR
4000.0000 mL | Freq: Once | ORAL | Status: AC
  Administered 2023-06-17: 4000 mL via ORAL
  Filled 2023-06-17: qty 4000

## 2023-06-17 NOTE — Progress Notes (Signed)
Progress Note   Patient: Harry Patterson. ZOX:[id number] DOB: 1945-09-24 DOA: 06/16/2023     1 DOS: the patient was seen and examined on 06/17/2023   Brief hospital course: Mr. Jaysun Libby is a 77 year old male with history of hypertension, hyperlipidemia, GERD, history of CKD stage IIIb, neuropathy, history of stricture of bulbous urethra who presents emergency department from [facility] clinic for chief concerns of low blood pressure and dizziness.  Patient reports he had 2 episodes of tarry stool today before coming to the hospital and 1 episodes of tarry stool while in the emergency department.   He endorses dysuria and suprapubic tenderness.  History of recent cystoscopy for balloon dilatation of urethral stricture on 11/5.  On presenting to ED, vitals stable, labs with Serum sodium is 138, potassium 4.3, chloride 110, bicarb 21, BUN of 88, serum creatinine 1.52, eGFR 47, nonfasting blood glucose 140, WBC 9.3, hemoglobin 7.5, platelets of 217.  UA was negative for concern of UTI.  ED treatment: 1 unit PRBC, Protonix 40 mg IV one-time dose, sodium chloride 500 mL bolus.  GI was consulted and patient will be going for EGD today.  11/17: Vital stable, hemoglobin with further decreased to 6.7 after getting 1 unit of PRBC, second unit was ordered.  Patient with nonanion gap metabolic acidosis, improving BUN and creatinine, seems like having CKD stage IIIa with baseline creatinine of 1.3-1.5.  EGD with chronic findings of known Barrett's esophagus and no other significant abnormality, no biopsies were taken.  Patient will be going for colonoscopy tomorrow, might need capsule endoscopy if that is inconclusive.  Ordered third unit of PRBC as hemoglobin was just above 2:07 units.  Assessment and Plan: * Upper GI bleed Symptomatic anemia Hemoglobin at 7.1 after getting 2 unit of PRBC.  S/p EGD which shows chronic signs of Barrett's esophagus and no other obvious source of bleeding.  No  biopsies were taken. GI is on board and patient is going for colonoscopy tomorrow and if no source found then he might need capsule endoscopy. -Ordered third unit of PRBC -Continue with IV Protonix -Continue to monitor  Symptomatic anemia Concerns for GI bleed, treat per above  Suprapubic tenderness Patient had cystoscopy with balloon urethral dilatation on 11/5 UA with no concern of UTI.  Essential hypertension Home metoprolol succinate 100 mg every morning, losartan 100 mg p.o. twice daily, spironolactone 100 mg every morning, chlorthalidone 25 mg every morning were not resumed on admission due to low normal tensive blood pressure in setting of concerns for upper GI bleed Hydralazine 5 mg IV every 6 hours as needed for SBP greater 175, 7 days ordered  Polyneuropathy Home gabapentin 300 mg in the morning and in the evening not resumed on admission due to low normal blood pressure on admission due to concerns of upper GI bleed Discussed my plan of holding gabapentin with patient and spouse at bedside and they are in agreement with this PDMP reviewed  Alcohol use The patient denies history of delirium tremens, or tremors.  He reports it has been 2 years since he has been without alcohol. Ativan 1 mg IV as needed for anxiety, 3 doses ordered with instructions to administer as appropriate and then let provider know Counseling given for alcohol progressive cessation Patient endorses understanding and compliance  Pure hypercholesterolemia Home atorvastatin 20 mg nightly resumed  Benign prostatic hyperplasia without lower urinary tract symptoms Home Flomax resumed for 11/17 Oxybutynin 5 mg every 8 hours as needed bladder spasms  B12 deficiency -  Continue home B12 supplement   Subjective: Patient was seen and examined after EGD today.  Denies any pain.  Had 1 more episode of melena earlier in the morning.  Only uses baby aspirin daily, no NSAID use.  Physical Exam: Vitals:    06/17/23 1030 06/17/23 1111 06/17/23 1130 06/17/23 1217  BP: 119/62  128/61 (!) 124/58  Pulse: 81  88 92  Resp: 20  (!) 23 20  Temp:  98.5 F (36.9 C)  98.3 F (36.8 C)  TempSrc:  Oral  Oral  SpO2: 96%  97% 95%  Weight:      Height:       General.  Well-developed elderly man, in no acute distress. Pulmonary.  Lungs clear bilaterally, normal respiratory effort. CV.  Regular rate and rhythm, no JVD, rub or murmur. Abdomen.  Soft, nontender, nondistended, BS positive. CNS.  Alert and oriented .  No focal neurologic deficit. Extremities.  No edema, no cyanosis, pulses intact and symmetrical. Psychiatry.  Judgment and insight appears normal.   Data Reviewed: Prior data reviewed  Family Communication: Discussed with wife at bedside  Disposition:   Home  Time spent: 50 minutes  This record has been created using Conservation officer, historic buildings. Errors have been sought and corrected,but may not always be located. Such creation errors do not reflect on the standard of care.   Author: Arnetha Courser, MD 06/17/2023 12:19 PM  For on call review [email or web address].

## 2023-06-17 NOTE — ED Notes (Signed)
CBG 145  

## 2023-06-17 NOTE — ED Notes (Signed)
Pt taken to procedure, will be back within 1hour or so per nurse who came to get pt.

## 2023-06-17 NOTE — Anesthesia Postprocedure Evaluation (Signed)
Anesthesia Post Note  Patient: Harry Patterson.  Procedure(s) Performed: ESOPHAGOGASTRODUODENOSCOPY (EGD)  Patient location during evaluation: PACU Anesthesia Type: General Level of consciousness: awake and alert Pain management: pain level controlled Vital Signs Assessment: post-procedure vital signs reviewed and stable Respiratory status: spontaneous breathing, nonlabored ventilation, respiratory function stable and patient connected to nasal cannula oxygen Cardiovascular status: blood pressure returned to baseline and stable Postop Assessment: no apparent nausea or vomiting Anesthetic complications: no  No notable events documented.   Last Vitals:  Vitals:   06/17/23 1240 06/17/23 1436  BP: 106/62 (!) 118/58  Pulse: 97 86  Resp: 20 20  Temp: 36.9 C 37.3 C  SpO2: 96% 94%    Last Pain:  Vitals:   06/17/23 1436  TempSrc: Oral  PainSc:                  Stephanie Coup

## 2023-06-17 NOTE — Assessment & Plan Note (Signed)
-  Continue home B12 supplement

## 2023-06-17 NOTE — ED Notes (Addendum)
Pt with concerns of amount of blood that continues to be present while doing bowel prep. Pt noted to have approx 500-700 mL out x 4 that has been grossly dark blood with some clots. Jawo notified. New order for H&H to be collected at 2300 received.

## 2023-06-17 NOTE — ED Notes (Signed)
Removed patients clothes and put on hospital gown for surgery.

## 2023-06-17 NOTE — Transfer of Care (Signed)
Immediate Anesthesia Transfer of Care Note  Patient: Harry Patterson.  Procedure(s) Performed: ESOPHAGOGASTRODUODENOSCOPY (EGD)  Patient Location: PACU  Anesthesia Type:General  Level of Consciousness: awake and alert   Airway & Oxygen Therapy: Patient Spontanous Breathing  Post-op Assessment: Report given to RN and Post -op Vital signs reviewed and stable  Post vital signs: Reviewed and stable  Last Vitals:  Vitals Value Taken Time  BP 96/48 06/17/23 0843  Temp    Pulse 100 06/17/23 0843  Resp    SpO2 95 % 06/17/23 0843    Last Pain:  Vitals:   06/17/23 0746  TempSrc:   PainSc: 0-No pain         Complications: No notable events documented.

## 2023-06-17 NOTE — Anesthesia Preprocedure Evaluation (Signed)
Anesthesia Evaluation  Patient identified by MRN, date of birth, ID band Patient awake    Reviewed: Allergy & Precautions, NPO status , Patient's Chart, lab work & pertinent test results  History of Anesthesia Complications Negative for: history of anesthetic complications  Airway Mallampati: III  TM Distance: <3 FB Neck ROM: full    Dental  (+) Chipped, Poor Dentition, Missing, Dental Advidsory Given   Pulmonary neg shortness of breath, sleep apnea , neg recent URI   Pulmonary exam normal        Cardiovascular Exercise Tolerance: Good hypertension, (-) angina + CAD  Normal cardiovascular exam+ dysrhythmias (LBBB)      Neuro/Psych neg Seizures  Neuromuscular disease  negative psych ROS   GI/Hepatic Neg liver ROS,GERD  Controlled,,  Endo/Other  negative endocrine ROS    Renal/GU      Musculoskeletal   Abdominal   Peds  Hematology negative hematology ROS (+)   Anesthesia Other Findings Past Medical History: No date: Allergic rhinitis No date: Arthritis     Comment:  knuckles No date: Avascular necrosis of bones of both hips (HCC) No date: B12 deficiency No date: Barrett's esophagus without dysplasia No date: Bilateral renal cysts No date: Cancer (HCC)     Comment:  skin No date: Enlarged prostate No date: GERD (gastroesophageal reflux disease) No date: Hypertension No date: LBBB (left bundle branch block) No date: Leukopenia No date: Peripheral neuropathy     Comment:  feet No date: Sleep apnea     Comment:  CPAP No date: Substance abuse [facility])  Past Surgical History: No date: APPENDECTOMY 06/11/2019: CATARACT EXTRACTION W/PHACO; Right     Comment:  Procedure: CATARACT EXTRACTION PHACO AND INTRAOCULAR               LENS PLACEMENT (IOC) RIGHT 00:54.6     22.0%    12.00;                Surgeon: Lockie Mola, MD;  Location: [facility]               SURGERY CNTR;  Service: Ophthalmology;  Laterality:                Right;  sleep apnea 06/30/2019: CATARACT EXTRACTION W/PHACO; Left     Comment:  Procedure: CATARACT EXTRACTION PHACO AND INTRAOCULAR               LENS PLACEMENT (IOC) LEFT TORIC LENS 9.97  01:09.1                14.4%;  Surgeon: Lockie Mola, MD;  Location:               [facility] SURGERY CNTR;  Service: Ophthalmology;                Laterality: Left; No date: COLONOSCOPY W/ POLYPECTOMY     Comment:  02/08/00,11'12'04,05/05/05, 08/21/2008, 12/05/2013,               05/08/2014 06/06/2017: COLONOSCOPY WITH PROPOFOL; N/A     Comment:  Procedure: COLONOSCOPY WITH PROPOFOL;  Surgeon: Scot Jun, MD;  Location: [facility] ENDOSCOPY;  Service:               Endoscopy;  Laterality: N/A; No date: ESOPHAGOGASTRODUODENOSCOPY     Comment:  x7 06/06/2017: ESOPHAGOGASTRODUODENOSCOPY (EGD) WITH PROPOFOL; N/A     Comment:  Procedure: ESOPHAGOGASTRODUODENOSCOPY (EGD) WITH  PROPOFOL;  Surgeon: Scot Jun, MD;  Location:               [facility] ENDOSCOPY;  Service: Endoscopy;  Laterality: N/A; 11/27/2019: ESOPHAGOGASTRODUODENOSCOPY (EGD) WITH PROPOFOL; N/A     Comment:  Procedure: ESOPHAGOGASTRODUODENOSCOPY (EGD) WITH               PROPOFOL;  Surgeon: Toledo, Boykin Nearing, MD;  Location:               [facility] ENDOSCOPY;  Service: Gastroenterology;  Laterality:               N/A; No date: HEMORRHOID SURGERY No date: TONSILLECTOMY  BMI    Body Mass Index: 28.89 kg/m      Reproductive/Obstetrics negative OB ROS                             Anesthesia Physical Anesthesia Plan  ASA: 2  Anesthesia Plan: General   Post-op Pain Management: Minimal or no pain anticipated   Induction: Intravenous  PONV Risk Score and Plan: 2 and Propofol infusion and TIVA  Airway Management Planned: Natural Airway and Nasal Cannula  Additional Equipment:   Intra-op Plan:   Post-operative Plan:   Informed Consent: I have reviewed the patients  History and Physical, chart, labs and discussed the procedure including the risks, benefits and alternatives for the proposed anesthesia with the patient or authorized representative who has indicated his/her understanding and acceptance.     Dental Advisory Given  Plan Discussed with: Anesthesiologist, CRNA and Surgeon  Anesthesia Plan Comments: (Patient consented for risks of anesthesia including but not limited to:  - adverse reactions to medications - risk of airway placement if required - damage to eyes, teeth, lips or other oral mucosa - nerve damage due to positioning  - sore throat or hoarseness - Damage to heart, brain, nerves, lungs, other parts of body or loss of life  Patient voiced understanding and assent.)       Anesthesia Quick Evaluation

## 2023-06-17 NOTE — Op Note (Signed)
[facility] Gastroenterology Patient Name: Harry Patterson Procedure Date: 06/17/2023 6:58 AM MRN: [id number] Account #: [id number] Date of Birth: 26-Dec-1945 Admit Type: Inpatient Age: 77 Room: [facility] ENDO ROOM 1 Gender: Male Note Status: Finalized Instrument Name: Upper Endoscope [id number] Procedure:             Upper GI endoscopy Indications:           Acute post hemorrhagic anemia, Melena Providers:             Boykin Nearing. Amos Gaber MD, MD Medicines:             Propofol per Anesthesia Complications:         No immediate complications. Estimated blood loss: None. Procedure:             Pre-Anesthesia Assessment:                        - The risks and benefits of the procedure and the                         sedation options and risks were discussed with the                         patient. All questions were answered and informed                         consent was obtained.                        - Patient identification and proposed procedure were                         verified prior to the procedure by the nurse. The                         procedure was verified in the procedure room.                        - ASA Grade Assessment: III - A patient with severe                         systemic disease.                        - After reviewing the risks and benefits, the patient                         was deemed in satisfactory condition to undergo the                         procedure.                        After obtaining informed consent, the endoscope was                         passed under direct vision. Throughout the procedure,                         the patient's blood pressure, pulse, and oxygen  saturations were monitored continuously. The Endoscope                         was introduced through the mouth, and advanced to the                         third part of duodenum. The upper GI endoscopy was                          accomplished without difficulty. The patient tolerated                         the procedure well. Findings:      There were esophageal mucosal changes secondary to established       long-segment Barrett's disease present in the lower third of the       esophagus. The maximum longitudinal extent of these mucosal changes was       5 cm in length. Biopsies not obtained at this time.      The exam of the esophagus was otherwise normal.      A 1 cm hiatal hernia was present.      The exam of the stomach was otherwise normal.      There is no endoscopic evidence of bleeding, inflammation, ulceration,       angioectasia or Dieulafoy lesions in the entire examined stomach.      The examined duodenum was normal. Impression:            - Esophageal mucosal changes secondary to established                         long-segment Barrett's disease.                        - 1 cm hiatal hernia.                        - Normal examined duodenum.                        - No specimens collected. Recommendation:        - Return patient to hospital ward for ongoing care.                        - Perform a colonoscopy tomorrow.                        - The findings and recommendations were discussed with                         the patient and their spouse. Procedure Code(s):     --- Professional ---                        [phone], Esophagogastroduodenoscopy, flexible,                         transoral; diagnostic, including collection of                         specimen(s) by brushing or washing, when performed                         (  separate procedure) Diagnosis Code(s):     --- Professional ---                        K92.1, Melena (includes Hematochezia)                        D62, Acute posthemorrhagic anemia                        K44.9, Diaphragmatic hernia without obstruction or                         gangrene                        K22.70, Barrett's esophagus without dysplasia CPT copyright 2022  American Medical Association. All rights reserved. The codes documented in this report are preliminary and upon coder review may  be revised to meet current compliance requirements. Stanton Kidney MD, MD 06/17/2023 8:43:28 AM This report has been signed electronically. Number of Addenda: 0 Note Initiated On: 06/17/2023 6:58 AM Estimated Blood Loss:  Estimated blood loss: none.      [facility]

## 2023-06-17 NOTE — ED Notes (Signed)
Call light answered, pt IV pump is beeping, RN notified

## 2023-06-18 ENCOUNTER — Other Ambulatory Visit: Payer: Self-pay

## 2023-06-18 ENCOUNTER — Inpatient Hospital Stay: Payer: BC Managed Care – PPO | Admitting: Anesthesiology

## 2023-06-18 ENCOUNTER — Encounter
Admission: EM | Disposition: A | Discharge status: Other Acute Inpatient Hospital | Payer: Self-pay | Source: Home / Self Care | Attending: Internal Medicine

## 2023-06-18 ENCOUNTER — Encounter: Payer: Self-pay | Admitting: Internal Medicine

## 2023-06-18 DIAGNOSIS — K922 Gastrointestinal hemorrhage, unspecified: Secondary | ICD-10-CM | POA: Diagnosis not present

## 2023-06-18 HISTORY — PX: COLONOSCOPY: SHX5424

## 2023-06-18 LAB — CBC
HCT: 18.8 % — ABNORMAL LOW (ref 39.0–52.0)
Hemoglobin: 6.5 g/dL — ABNORMAL LOW (ref 13.0–17.0)
MCH: 33 pg (ref 26.0–34.0)
MCHC: 34.6 g/dL (ref 30.0–36.0)
MCV: 95.4 fL (ref 80.0–100.0)
Platelets: 146 10*3/uL — ABNORMAL LOW (ref 150–400)
RBC: 1.97 MIL/uL — ABNORMAL LOW (ref 4.22–5.81)
RDW: 15.5 % (ref 11.5–15.5)
WBC: 10.4 10*3/uL (ref 4.0–10.5)
nRBC: 1 % — ABNORMAL HIGH (ref 0.0–0.2)

## 2023-06-18 LAB — BASIC METABOLIC PANEL
Anion gap: 7 (ref 5–15)
BUN: 48 mg/dL — ABNORMAL HIGH (ref 8–23)
CO2: 23 mmol/L (ref 22–32)
Calcium: 7.9 mg/dL — ABNORMAL LOW (ref 8.9–10.3)
Chloride: 111 mmol/L (ref 98–111)
Creatinine, Ser: 1.31 mg/dL — ABNORMAL HIGH (ref 0.61–1.24)
GFR, Estimated: 56 mL/min — ABNORMAL LOW (ref >60)
Glucose, Bld: 150 mg/dL — ABNORMAL HIGH (ref 70–99)
Potassium: 3.9 mmol/L (ref 3.5–5.1)
Sodium: 141 mmol/L (ref 135–145)

## 2023-06-18 LAB — PREPARE RBC (CROSSMATCH)

## 2023-06-18 LAB — HEMOGLOBIN AND HEMATOCRIT, BLOOD
HCT: 20.8 % — ABNORMAL LOW (ref 39.0–52.0)
Hemoglobin: 7.2 g/dL — ABNORMAL LOW (ref 13.0–17.0)

## 2023-06-18 SURGERY — COLONOSCOPY
Anesthesia: General

## 2023-06-18 MED ORDER — LIDOCAINE HCL (CARDIAC) PF 100 MG/5ML IV SOSY
PREFILLED_SYRINGE | INTRAVENOUS | Status: DC | PRN
  Administered 2023-06-18: 40 mg via INTRAVENOUS

## 2023-06-18 MED ORDER — PROPOFOL 10 MG/ML IV BOLUS
INTRAVENOUS | Status: AC
  Filled 2023-06-18: qty 20

## 2023-06-18 MED ORDER — SODIUM CHLORIDE 0.9 % IV SOLN: INTRAVENOUS | Status: DC

## 2023-06-18 MED ORDER — PROPOFOL 500 MG/50ML IV EMUL
INTRAVENOUS | Status: DC | PRN
  Administered 2023-06-18: 100 ug/kg/min via INTRAVENOUS
  Administered 2023-06-18: 60 mg via INTRAVENOUS
  Administered 2023-06-18: 30 mg via INTRAVENOUS

## 2023-06-18 MED ORDER — PROPOFOL 10 MG/ML IV BOLUS
INTRAVENOUS | Status: AC
  Filled 2023-06-18: qty 40

## 2023-06-18 NOTE — Transfer of Care (Signed)
Immediate Anesthesia Transfer of Care Note  Patient: Harry Patterson.  Procedure(s) Performed: COLONOSCOPY  Patient Location: PACU  Anesthesia Type:MAC  Level of Consciousness: awake  Airway & Oxygen Therapy: Patient Spontanous Breathing  Post-op Assessment: Report given to RN and Post -op Vital signs reviewed and stable  Post vital signs: Reviewed and stable  Last Vitals:  Vitals Value Taken Time  BP 103/45 06/18/23 1636  Temp 36.1 C 06/18/23 1635  Pulse 92 06/18/23 1637  Resp 13 06/18/23 1637  SpO2 95 % 06/18/23 1637  Vitals shown include unfiled device data.  Last Pain:  Vitals:   06/18/23 1635  TempSrc: Temporal  PainSc: Asleep         Complications: No notable events documented.

## 2023-06-18 NOTE — Op Note (Signed)
[facility] Gastroenterology Patient Name: Harry Patterson Procedure Date: 06/18/2023 12:31 PM MRN: [id number] Account #: [id number] Date of Birth: [birth year] Admit Type: Inpatient Age: 77 Room: [facility] ENDO ROOM 3 Gender: Male Note Status: Finalized Instrument Name: Nelda Marseille [id number] Procedure:             Colonoscopy Indications:           Rectal bleeding, Acute post hemorrhagic anemia Providers:             Boykin Nearing. Norma Fredrickson MD, MD Referring MD:          Daniel Nones, MD (Referring MD) Medicines:             Propofol per Anesthesia Complications:         No immediate complications. Estimated blood loss:                         Minimal. Procedure:             Pre-Anesthesia Assessment:                        - The risks and benefits of the procedure and the                         sedation options and risks were discussed with the                         patient. All questions were answered and informed                         consent was obtained.                        - Patient identification and proposed procedure were                         verified prior to the procedure by the nurse. The                         procedure was verified in the procedure room.                        - ASA Grade Assessment: III - A patient with severe                         systemic disease.                        - After reviewing the risks and benefits, the patient                         was deemed in satisfactory condition to undergo the                         procedure.                        After obtaining informed consent, the colonoscope was                         passed under direct  vision. Throughout the procedure,                         the patient's blood pressure, pulse, and oxygen                         saturations were monitored continuously. The                         Colonoscope was introduced through the anus and                         advanced to  the the cecum, identified by appendiceal                         orifice and ileocecal valve. The colonoscopy was                         performed without difficulty. The patient tolerated                         the procedure well. The quality of the bowel                         preparation was fair. The ileocecal valve, appendiceal                         orifice, and rectum were photographed. Findings:      The perianal and digital rectal examinations were normal. Pertinent       negatives include normal sphincter tone and no palpable rectal lesions.      Non-bleeding internal hemorrhoids were found during retroflexion. The       hemorrhoids were Grade I (internal hemorrhoids that do not prolapse).      A few small-mouthed diverticula were found in the sigmoid colon.      Red blood was found in the sigmoid colon, in the transverse colon, in       the ascending colon and in the cecum.      Large consortium of clots was found in the ascending colon. suctioning,       clot removal did not identify any bleeding lesions in the right colon,       though it appeared this is the locaton of the most red blood.      The terminal ileum contained hematin (altered blood/coffee-ground-like       material). This may be due to reflux of blood from the cecum to the       ileum.      The remainder of the exam in the terminal ileum was normal.      Lavage of the area was performed using copious amounts of sterile water,       resulting in clearance with fair visualization.      The exam was otherwise without abnormality. Impression:            - Preparation of the colon was fair.                        - Non-bleeding internal hemorrhoids.                        -  Diverticulosis in the sigmoid colon.                        - Blood in the sigmoid colon, in the transverse colon,                         in the ascending colon and in the cecum.                        - Blood in the ascending colon.                         - Blood in the terminal ileum.                        - The examination was otherwise normal.                        - Lavage resulting in clearance with fair                         visualization.                        - No specimens collected.                        - The exam was suboptimal due to bleeding.                        - Presumed site of bleeding was the right colon,                         though no lesions could be identified, mucosa appeared                         normal. ?Right diverticular bleed. Cannot rule out the                         possibility of small bowel source. Recommendation:        - To visualize the small bowel, perform video capsule                         endoscopy today.                        - The findings and recommendations were discussed with                         the patient and their spouse. Procedure Code(s):     --- Professional ---                        [phone], Colonoscopy, flexible; diagnostic, including                         collection of specimen(s) by brushing or washing, when                         performed (separate procedure) Diagnosis Code(s):     ---  Professional ---                        K57.30, Diverticulosis of large intestine without                         perforation or abscess without bleeding                        D62, Acute posthemorrhagic anemia                        K62.5, Hemorrhage of anus and rectum                        K92.2, Gastrointestinal hemorrhage, unspecified                        K64.0, First degree hemorrhoids CPT copyright 2022 American Medical Association. All rights reserved. The codes documented in this report are preliminary and upon coder review may  be revised to meet current compliance requirements. Stanton Kidney MD, MD 06/18/2023 4:41:30 PM This report has been signed electronically. Number of Addenda: 0 Note Initiated On: 06/18/2023 12:31 PM Scope Withdrawal  Time: 0 hours 27 minutes 3 seconds  Total Procedure Duration: 0 hours 35 minutes 29 seconds  Estimated Blood Loss:  Estimated blood loss was minimal.      [facility]

## 2023-06-18 NOTE — Anesthesia Procedure Notes (Signed)
Procedure Name: MAC Date/Time: 06/18/2023 3:47 PM  Performed by: Elisabeth Pigeon, CRNAPre-anesthesia Checklist: Patient identified, Emergency Drugs available, Suction available, Patient being monitored and Timeout performed Patient Re-evaluated:Patient Re-evaluated prior to induction Oxygen Delivery Method: Nasal cannula

## 2023-06-18 NOTE — Progress Notes (Addendum)
       CROSS COVER NOTE  NAME: Harry Patterson. MRN: [id number] DOB : Oct 14, 1945    Date of Service   06/18/2023   HPI/Events of Note   Nurse paged because patient still has dark stools with his bowel prep.  Patient is scheduled for colonoscopy tomorrow.  No symptoms reported.  Chart review showed that patient had to be transfused with 1 units of PRBCs for acute blood loss anemia.   Interventions   H&H ordered to ensure patient does not need another transfusion.  H&H stable will continue to monitor.    Update: Patient's morning hemoglobin resulted at 6.5 g/dL.  1 unit PRBCs ordered for transfusion.  Harry Voges Lamin Geradine Girt, MSN, APRN, AGACNP-BC [facility] [place] Pager: [phone]. Check Amion for Availability

## 2023-06-18 NOTE — Progress Notes (Signed)
Transition of Care [facility]) - Inpatient Brief Assessment   Patient Details  Name: Harry Patterson. MRN: [id number] Date of Birth: 1946/07/25  Transition of Care [facility]) CM/SW Contact:    Truddie Hidden, RN Phone Number: 06/18/2023, 11:54 AM   Clinical Narrative: TOC continuing to follow patient's progress throughout discharge planning.     Transition of Care Asessment: Insurance and Status: Insurance coverage has been reviewed Patient has primary care physician: Yes Home environment has been reviewed: home Prior level of function:: independent Prior/Current Home Services: No current home services Social Determinants of Health Reivew: SDOH reviewed no interventions necessary Readmission risk has been reviewed: Yes Transition of care needs: no transition of care needs at this time

## 2023-06-18 NOTE — Anesthesia Postprocedure Evaluation (Signed)
Anesthesia Post Note  Patient: Harry Patterson.  Procedure(s) Performed: COLONOSCOPY  Patient location during evaluation: Endoscopy Anesthesia Type: General Level of consciousness: awake and alert Pain management: pain level controlled Vital Signs Assessment: post-procedure vital signs reviewed and stable Respiratory status: spontaneous breathing, nonlabored ventilation, respiratory function stable and patient connected to nasal cannula oxygen Cardiovascular status: blood pressure returned to baseline and stable Postop Assessment: no apparent nausea or vomiting Anesthetic complications: no   No notable events documented.   Last Vitals:  Vitals:   06/18/23 1635 06/18/23 1655  BP: 103/64 (!) 141/62  Pulse: 95 86  Resp: 18 18  Temp: (!) 36.1 C   SpO2: 95% 96%    Last Pain:  Vitals:   06/18/23 1655  TempSrc:   PainSc: 0-No pain                 Corinda Gubler

## 2023-06-18 NOTE — Anesthesia Preprocedure Evaluation (Addendum)
Anesthesia Evaluation  Patient identified by MRN, date of birth, ID band Patient awake    Reviewed: Allergy & Precautions, NPO status , Patient's Chart, lab work & pertinent test results  History of Anesthesia Complications Negative for: history of anesthetic complications  Airway Mallampati: III  TM Distance: <3 FB Neck ROM: full    Dental  (+) Chipped, Poor Dentition, Missing, Dental Advidsory Given   Pulmonary neg shortness of breath, sleep apnea and Continuous Positive Airway Pressure Ventilation , neg recent URI   Pulmonary exam normal        Cardiovascular Exercise Tolerance: Good hypertension, (-) angina + CAD  Normal cardiovascular exam+ dysrhythmias (LBBB)   ECG 06/16/23: Sinus tachycardia Left bundle branch block  Echo 05/11/21:    NORMAL LEFT VENTRICULAR SYSTOLIC FUNCTION WITH MILD LVH    NORMAL RIGHT VENTRICULAR SYSTOLIC FUNCTION    VALVULAR REGURGITATION: MILD AR, TRIVIAL MR, TRIVIAL TR    NO VALVULAR STENOSIS    BRADYCARDIC (HR 50-55 BPM) THROUGHOUT EXAM    Neuro/Psych neg Seizures  Neuromuscular disease (polyneuropathy)  negative psych ROS   GI/Hepatic Neg liver ROS,GERD (Barrett esophagus)  Controlled,,  Endo/Other  negative endocrine ROS  Prediabetes   Renal/GU Renal disease (stage III CKD)   BPH    Musculoskeletal  (+) Arthritis ,    Abdominal   Peds  Hematology negative hematology ROS (+) Blood dyscrasia, anemia   Anesthesia Other Findings Past Medical History: No date: Allergic rhinitis No date: Arthritis     Comment:  knuckles No date: Avascular necrosis of bones of both hips (HCC) No date: B12 deficiency No date: Barrett's esophagus without dysplasia No date: Bilateral renal cysts No date: Cancer (HCC)     Comment:  skin No date: Enlarged prostate No date: GERD (gastroesophageal reflux disease) No date: Hypertension No date: LBBB (left bundle branch block) No date:  Leukopenia No date: Peripheral neuropathy     Comment:  feet No date: Sleep apnea     Comment:  CPAP No date: Substance abuse [facility])  Past Surgical History: No date: APPENDECTOMY 06/11/2019: CATARACT EXTRACTION W/PHACO; Right     Comment:  Procedure: CATARACT EXTRACTION PHACO AND INTRAOCULAR               LENS PLACEMENT (IOC) RIGHT 00:54.6     22.0%    12.00;                Surgeon: Lockie Mola, MD;  Location: [facility]               SURGERY CNTR;  Service: Ophthalmology;  Laterality:               Right;  sleep apnea 06/30/2019: CATARACT EXTRACTION W/PHACO; Left     Comment:  Procedure: CATARACT EXTRACTION PHACO AND INTRAOCULAR               LENS PLACEMENT (IOC) LEFT TORIC LENS 9.97  01:09.1                14.4%;  Surgeon: Lockie Mola, MD;  Location:               [facility] SURGERY CNTR;  Service: Ophthalmology;                Laterality: Left; No date: COLONOSCOPY W/ POLYPECTOMY     Comment:  02/08/00,11'12'04,05/05/05, 08/21/2008, 12/05/2013,               05/08/2014 06/06/2017: COLONOSCOPY WITH PROPOFOL; N/A     Comment:  Procedure: COLONOSCOPY WITH PROPOFOL;  Surgeon: Scot Jun, MD;  Location: [facility] ENDOSCOPY;  Service:               Endoscopy;  Laterality: N/A; No date: ESOPHAGOGASTRODUODENOSCOPY     Comment:  x7 06/06/2017: ESOPHAGOGASTRODUODENOSCOPY (EGD) WITH PROPOFOL; N/A     Comment:  Procedure: ESOPHAGOGASTRODUODENOSCOPY (EGD) WITH               PROPOFOL;  Surgeon: Scot Jun, MD;  Location:               [facility] ENDOSCOPY;  Service: Endoscopy;  Laterality: N/A; 11/27/2019: ESOPHAGOGASTRODUODENOSCOPY (EGD) WITH PROPOFOL; N/A     Comment:  Procedure: ESOPHAGOGASTRODUODENOSCOPY (EGD) WITH               PROPOFOL;  Surgeon: Toledo, Boykin Nearing, MD;  Location:               [facility] ENDOSCOPY;  Service: Gastroenterology;  Laterality:               N/A; No date: HEMORRHOID SURGERY No date: TONSILLECTOMY  BMI    Body Mass Index: 28.89 kg/m       Reproductive/Obstetrics negative OB ROS                              Anesthesia Physical Anesthesia Plan  ASA: 3  Anesthesia Plan: General   Post-op Pain Management: Minimal or no pain anticipated   Induction: Intravenous  PONV Risk Score and Plan: 2 and Propofol infusion and TIVA  Airway Management Planned: Natural Airway and Nasal Cannula  Additional Equipment:   Intra-op Plan:   Post-operative Plan:   Informed Consent: I have reviewed the patients History and Physical, chart, labs and discussed the procedure including the risks, benefits and alternatives for the proposed anesthesia with the patient or authorized representative who has indicated his/her understanding and acceptance.     Dental Advisory Given  Plan Discussed with: Anesthesiologist, CRNA and Surgeon  Anesthesia Plan Comments: (Patient consented for risks of anesthesia including but not limited to:  - adverse reactions to medications - risk of airway placement if required - damage to eyes, teeth, lips or other oral mucosa - nerve damage due to positioning  - sore throat or hoarseness - Damage to heart, brain, nerves, lungs, other parts of body or loss of life  Patient voiced understanding and assent.)        Anesthesia Quick Evaluation

## 2023-06-18 NOTE — Progress Notes (Signed)
Progress Note   Patient: Harry Patterson. AVW:[id number] DOB: [birth year] DOA: 06/16/2023     2 DOS: the patient was seen and examined on 06/18/2023     Brief hospital course: Mr. Harry Patterson is a 77 year old male with history of hypertension, hyperlipidemia, GERD, history of CKD stage IIIb, neuropathy, history of stricture of bulbous urethra who presents emergency department from [facility] clinic for chief concerns of low blood pressure and dizziness.   Patient reports he had 2 episodes of tarry stool today before coming to the hospital and 1 episodes of tarry stool while in the emergency department.   He endorses dysuria and suprapubic tenderness.  History of recent cystoscopy for balloon dilatation of urethral stricture on 11/5.   On presenting to ED, vitals stable, labs with Serum sodium is 138, potassium 4.3, chloride 110, bicarb 21, BUN of 88, serum creatinine 1.52, eGFR 47, nonfasting blood glucose 140, WBC 9.3, hemoglobin 7.5, platelets of 217.  UA was negative for concern of UTI.   ED treatment: 1 unit PRBC, Protonix 40 mg IV one-time dose, sodium chloride 500 mL bolus.   GI was consulted and patient will be going for EGD today.   11/17: Vital stable, hemoglobin with further decreased to 6.7 after getting 1 unit of PRBC, second unit was ordered.  Patient with nonanion gap metabolic acidosis, improving BUN and creatinine, seems like having CKD stage IIIa with baseline creatinine of 1.3-1.5.  EGD with chronic findings of known Barrett's esophagus and no other significant abnormality, no biopsies were taken.  Patient will be going for colonoscopy tomorrow, might need capsule endoscopy if that is inconclusive.  Ordered third unit of PRBC as hemoglobin was just above 2:07 units.   Assessment and Plan: * Upper GI bleed Symptomatic anemia Hemoglobin at 7.1 after getting 4 unit of PRBC.   Underwent EGD that detected Barrett's esophagus with no obvious signs of bleeding  noted Status post 4 units of blood transfusion Continue with IV Protonix Pending colonoscopy today Overnight patient had an episode of bleeding requiring additional 1 unit of blood transfusion   Symptomatic anemia Concerns for GI bleed, treat per above Gastroenterologist on board Case discussed planning colonoscopy today   Suprapubic tenderness Patient had cystoscopy with balloon urethral dilatation on 11/5 UA with no concern of UTI.   Essential hypertension Home metoprolol succinate 100 mg every morning, losartan 100 mg p.o. twice daily, spironolactone 100 mg every morning, chlorthalidone 25 mg every morning were not resumed on admission due to low normal tensive blood pressure in setting of concerns for upper GI bleed Hydralazine 5 mg IV every 6 hours as needed for SBP greater 175, 7 days ordered   Polyneuropathy Home gabapentin 300 mg in the morning and in the evening not resumed on admission due to low normal blood pressure on admission due to concerns of upper GI bleed Discussed my plan of holding gabapentin with patient and spouse at bedside and they are in agreement with this PDMP reviewed   Alcohol use Counseled on cessation Continue CIWA protocol   Pure hypercholesterolemia Continue Home atorvastatin 20 mg nightly resumed   Benign prostatic hyperplasia without lower urinary tract symptoms Home Flomax resumed for 11/17 Continue oxybutynin 5 mg every 8 hours as needed bladder spasms   B12 deficiency -Continue home B12 supplement     Subjective:  Seen and examined at bedside this morning Had an episode of bleeding per rectum yesterday Being planned for colonoscopy today  Physical Exam:  General.  Well-developed  elderly man, in no acute distress. Pulmonary.  Lungs clear bilaterally, normal respiratory effort. CV.  Regular rate and rhythm, no JVD, rub or murmur. Abdomen.  Soft, nontender, nondistended, BS positive. CNS.  Alert and oriented .  No focal neurologic  deficit. Extremities.  No edema, no cyanosis, pulses intact and symmetrical. Psychiatry.  Judgment and insight appears normal.     Family Communication: Discussed with wife at bedside   Disposition:   Home   Time spent:    Vitals:   06/18/23 0744 06/18/23 0810 06/18/23 1207 06/18/23 1540  BP: (!) 108/46 (!) 133/58 134/69 (!) 152/60  Pulse: 82 80 85 90  Resp: 18 16  18   Temp: 98.4 F (36.9 C)  99.1 F (37.3 C) (!) 97.5 F (36.4 C)  TempSrc: Oral   Temporal  SpO2: 94% 95% 94% 94%  Weight:      Height:        Data Reviewed:    Latest Ref Rng & Units 06/18/2023    8:31 AM 06/18/2023    4:16 AM 06/17/2023   11:45 PM  CBC  WBC 4.0 - 10.5 K/uL  10.4    Hemoglobin 13.0 - 17.0 g/dL 7.2  6.5  7.9   Hematocrit 39.0 - 52.0 % 20.8  18.8  23.0   Platelets 150 - 400 K/uL  146         Latest Ref Rng & Units 06/18/2023    4:16 AM 06/17/2023   10:07 AM 06/17/2023    2:40 AM  BMP  Glucose 70 - 99 mg/dL 093  235  573   BUN 8 - 23 mg/dL 48  62  66   Creatinine 0.61 - 1.24 mg/dL 2.20  2.54  2.70   Sodium 135 - 145 mmol/L 141  137  136   Potassium 3.5 - 5.1 mmol/L 3.9  4.1  4.1   Chloride 98 - 111 mmol/L 111  107  110   CO2 22 - 32 mmol/L 23  20  14    Calcium 8.9 - 10.3 mg/dL 7.9  8.1  8.2     Author: Loyce Dys, MD 06/18/2023 4:24 PM  For on call review [email or web address].

## 2023-06-19 ENCOUNTER — Inpatient Hospital Stay: Payer: BC Managed Care – PPO

## 2023-06-19 ENCOUNTER — Encounter: Payer: Self-pay | Admitting: Internal Medicine

## 2023-06-19 DIAGNOSIS — K922 Gastrointestinal hemorrhage, unspecified: Secondary | ICD-10-CM | POA: Diagnosis not present

## 2023-06-19 LAB — BASIC METABOLIC PANEL
Anion gap: 8 (ref 5–15)
BUN: 29 mg/dL — ABNORMAL HIGH (ref 8–23)
CO2: 25 mmol/L (ref 22–32)
Calcium: 7.7 mg/dL — ABNORMAL LOW (ref 8.9–10.3)
Chloride: 106 mmol/L (ref 98–111)
Creatinine, Ser: 1.05 mg/dL (ref 0.61–1.24)
GFR, Estimated: >60 mL/min (ref >60)
Glucose, Bld: 144 mg/dL — ABNORMAL HIGH (ref 70–99)
Potassium: 3.8 mmol/L (ref 3.5–5.1)
Sodium: 139 mmol/L (ref 135–145)

## 2023-06-19 LAB — CBC WITH DIFFERENTIAL/PLATELET
Abs Immature Granulocytes: 0.16 10*3/uL — ABNORMAL HIGH (ref 0.00–0.07)
Basophils Absolute: 0.1 10*3/uL (ref 0.0–0.1)
Basophils Relative: 1 %
Eosinophils Absolute: 0.2 10*3/uL (ref 0.0–0.5)
Eosinophils Relative: 2 %
HCT: 17.6 % — ABNORMAL LOW (ref 39.0–52.0)
Hemoglobin: 6 g/dL — ABNORMAL LOW (ref 13.0–17.0)
Immature Granulocytes: 2 %
Lymphocytes Relative: 14 %
Lymphs Abs: 1.4 10*3/uL (ref 0.7–4.0)
MCH: 32.8 pg (ref 26.0–34.0)
MCHC: 34.1 g/dL (ref 30.0–36.0)
MCV: 96.2 fL (ref 80.0–100.0)
Monocytes Absolute: 1.2 10*3/uL — ABNORMAL HIGH (ref 0.1–1.0)
Monocytes Relative: 12 %
Neutro Abs: 6.9 10*3/uL (ref 1.7–7.7)
Neutrophils Relative %: 69 %
Platelets: 144 10*3/uL — ABNORMAL LOW (ref 150–400)
RBC: 1.83 MIL/uL — ABNORMAL LOW (ref 4.22–5.81)
RDW: 16 % — ABNORMAL HIGH (ref 11.5–15.5)
WBC: 9.9 10*3/uL (ref 4.0–10.5)
nRBC: 2.1 % — ABNORMAL HIGH (ref 0.0–0.2)

## 2023-06-19 LAB — HEMOGLOBIN AND HEMATOCRIT, BLOOD
HCT: 18.4 % — ABNORMAL LOW (ref 39.0–52.0)
HCT: 20.7 % — ABNORMAL LOW (ref 39.0–52.0)
Hemoglobin: 6.4 g/dL — ABNORMAL LOW (ref 13.0–17.0)
Hemoglobin: 7.3 g/dL — ABNORMAL LOW (ref 13.0–17.0)

## 2023-06-19 LAB — PREPARE RBC (CROSSMATCH)

## 2023-06-19 MED ORDER — SODIUM CHLORIDE 0.9% IV SOLUTION: Freq: Once | INTRAVENOUS | Status: AC

## 2023-06-19 MED ORDER — IOHEXOL 350 MG/ML SOLN
100.0000 mL | Freq: Once | INTRAVENOUS | Status: AC | PRN
  Administered 2023-06-19: 100 mL via INTRAVENOUS

## 2023-06-19 NOTE — Progress Notes (Signed)
       CROSS COVER NOTE  NAME: Harry Patterson. MRN: [id number] DOB : 05-20-1946    Date of Service   06/19/2023   HPI/Events of Note   Patient's hemoglobin resulted critically at 6.0 g/dL.  There were no reports of bloody bowel movement overnight.  Patient stable and denies symptoms at this time.  Interventions   1 unit PRBCs ordered.   follow-up H&H ordered.       Sham Alviar Lamin Geradine Girt, MSN, APRN, AGACNP-BC [facility] [place] Pager: [phone]. Check Amion for Availability

## 2023-06-19 NOTE — Plan of Care (Signed)

## 2023-06-19 NOTE — Progress Notes (Signed)
MEWS Progress Note  Patient Details Name: Harry Patterson. MRN: [id number] DOB: 1945/12/04 Today's Date: 06/19/2023   MEWS Flowsheet Documentation:  Assess: MEWS Score Temp: (!) 100.5 F (38.1 C) BP: (!) 155/80 MAP (mmHg): 99 Pulse Rate: (!) 104 ECG Heart Rate: (!) 110 Resp: 20 Level of Consciousness: Alert SpO2: 92 % O2 Device: Room Air Patient Activity (if Appropriate): In bed Assess: MEWS Score MEWS Temp: 1 MEWS Systolic: 0 MEWS Pulse: 1 MEWS RR: 0 MEWS LOC: 0 MEWS Score: 2 MEWS Score Color: Yellow Assess: SIRS CRITERIA SIRS Temperature : 0 SIRS Respirations : 0 SIRS Pulse: 1 SIRS WBC: 0 SIRS Score Sum : 1 SIRS Temperature : 0 SIRS Pulse: 1 SIRS Respirations : 0 SIRS WBC: 0 SIRS Score Sum : 1 Assess: if the MEWS score is Yellow or Red Were vital signs accurate and taken at a resting state?: Yes Does the patient meet 2 or more of the SIRS criteria?: No MEWS guidelines implemented : Yes, yellow Treat MEWS Interventions: Considered administering scheduled or prn medications/treatments as ordered Take Vital Signs Increase Vital Sign Frequency : Yellow: Q2hr x1, continue Q4hrs until patient remains green for 12hrs Escalate MEWS: Escalate: Yellow: Discuss with charge nurse and consider notifying provider and/or RRT        Maryfrances Portugal Shary Key 06/19/2023, 2:13 AM

## 2023-06-19 NOTE — Plan of Care (Signed)
  Problem: Education: Goal: Knowledge of General Education information will improve Description: Including pain rating scale, medication(s)/side effects and non-pharmacologic comfort measures Outcome: Progressing   Problem: Health Behavior/Discharge Planning: Goal: Ability to manage health-related needs will improve Outcome: Progressing   Problem: Clinical Measurements: Goal: Ability to maintain clinical measurements within normal limits will improve Outcome: Progressing Goal: Will remain free from infection Outcome: Progressing Goal: Diagnostic test results will improve Outcome: Progressing Goal: Respiratory complications will improve Outcome: Progressing Goal: Cardiovascular complication will be avoided Outcome: Progressing   Problem: Activity: Goal: Risk for activity intolerance will decrease Outcome: Progressing   Problem: Nutrition: Goal: Adequate nutrition will be maintained Outcome: Progressing   Problem: Coping: Goal: Level of anxiety will decrease Outcome: Progressing   Problem: Elimination: Goal: Will not experience complications related to bowel motility Outcome: Progressing Goal: Will not experience complications related to urinary retention Outcome: Progressing   Problem: Pain Management: Goal: General experience of comfort will improve Outcome: Progressing   Problem: Safety: Goal: Ability to remain free from injury will improve Outcome: Progressing   Problem: Skin Integrity: Goal: Risk for impaired skin integrity will decrease Outcome: Progressing   Problem: Education: Goal: Ability to identify signs and symptoms of gastrointestinal bleeding will improve Outcome: Progressing   Problem: Bowel/Gastric: Goal: Will show no signs and symptoms of gastrointestinal bleeding Outcome: Progressing   Problem: Fluid Volume: Goal: Will show no signs and symptoms of excessive bleeding Outcome: Progressing   Problem: Clinical Measurements: Goal:  Complications related to the disease process, condition or treatment will be avoided or minimized Outcome: Progressing

## 2023-06-19 NOTE — Progress Notes (Signed)
Progress Note   Patient: Harry Patterson. RUE:[id number] DOB: 1946-01-21 DOA: 06/16/2023     3 DOS: the patient was seen and examined on 06/19/2023    Brief hospital course: Mr. Harry Patterson is a 77 year old male with history of hypertension, hyperlipidemia, GERD, history of CKD stage IIIb, neuropathy, history of stricture of bulbous urethra who presents emergency department from [facility] clinic for chief concerns of low blood pressure and dizziness.   Patient reports he had 2 episodes of tarry stool today before coming to the hospital and 1 episodes of tarry stool while in the emergency department.   He endorses dysuria and suprapubic tenderness.  History of recent cystoscopy for balloon dilatation of urethral stricture on 11/5.   On presenting to ED, vitals stable, labs with Serum sodium is 138, potassium 4.3, chloride 110, bicarb 21, BUN of 88, serum creatinine 1.52, eGFR 47, nonfasting blood glucose 140, WBC 9.3, hemoglobin 7.5, platelets of 217.  UA was negative for concern of UTI.   ED treatment: 1 unit PRBC, Protonix 40 mg IV one-time dose, sodium chloride 500 mL bolus.   GI was consulted and patient will be going for EGD today.   11/17: Vital stable, hemoglobin with further decreased to 6.7 after getting 1 unit of PRBC, second unit was ordered.  Patient with nonanion gap metabolic acidosis, improving BUN and creatinine, seems like having CKD stage IIIa with baseline creatinine of 1.3-1.5.  EGD with chronic findings of known Barrett's esophagus and no other significant abnormality, no biopsies were taken.  Patient will be going for colonoscopy tomorrow, might need capsule endoscopy if that is inconclusive.  Ordered third unit of PRBC as hemoglobin was just above 2:07 units.   Assessment and Plan: * Upper GI bleed Symptomatic anemia Hemoglobin at 7.1 after getting 4 unit of PRBC.   Underwent EGD that detected Barrett's esophagus with no obvious signs of bleeding  noted Status post 5 units of blood transfusion Continue with IV Protonix Underwent colonoscopy on 06/18/2023 Patient received additional 1 unit transfusion this morning and repeat hemoglobin still showed a drop Capsule endoscopy showing findings of active bleeding Case discussed with gastroenterologist as well as interventional radiologist We will make patient n.p.o. for now  Symptomatic anemia Concerns for GI bleed, treat per above Gastroenterologist on board Case discussed planning colonoscopy today   Suprapubic tenderness Patient had cystoscopy with balloon urethral dilatation on 11/5 UA with no concern of UTI.   Essential hypertension Home metoprolol succinate 100 mg every morning, losartan 100 mg p.o. twice daily, spironolactone 100 mg every morning, chlorthalidone 25 mg every morning were not resumed on admission due to low normal tensive blood pressure in setting of concerns for upper GI bleed Hydralazine 5 mg IV every 6 hours as needed for SBP greater 175, 7 days ordered   Polyneuropathy Home gabapentin 300 mg in the morning and in the evening not resumed on admission due to low normal blood pressure on admission due to concerns of upper GI bleed Discussed my plan of holding gabapentin with patient and spouse at bedside and they are in agreement with this PDMP reviewed   Alcohol use Counseled on cessation Continue CIWA protocol   Pure hypercholesterolemia Continue Home atorvastatin 20 mg nightly resumed   Benign prostatic hyperplasia without lower urinary tract symptoms Home Flomax resumed for 11/17 Continue oxybutynin 5 mg every 8 hours as needed bladder spasms   B12 deficiency -Continue home B12 supplement     Subjective:  Seen and examined at  bedside this morning Underwent capsule endoscopy showing findings of active bleed I have discussed with gastroenterologist as well as interventional radiologist Interventional radiologist have recommended CT angio with GI  protocol   Physical Exam:   General.  Well-developed elderly man, in no acute distress. Pulmonary.  Lungs clear bilaterally, normal respiratory effort. CV.  Regular rate and rhythm, no JVD, rub or murmur. Abdomen.  Soft, nontender, nondistended, BS positive. CNS.  Alert and oriented .  No focal neurologic deficit. Extremities.  No edema, no cyanosis, pulses intact and symmetrical. Psychiatry.  Judgment and insight appears normal.      Family Communication: Discussed with wife at bedside   Disposition: Home when medically stable, currently having active bleed   Time spent:      Vitals:   06/19/23 0930 06/19/23 1214 06/19/23 1401 06/19/23 1418  BP: (!) 141/65 125/64 111/62 114/60  Pulse: 90 92    Resp:   16 18  Temp: 98.1 F (36.7 C) 98.9 F (37.2 C) 98.1 F (36.7 C) 98.1 F (36.7 C)  TempSrc:   Oral Oral  SpO2: 96% 95% 95% 96%  Weight:      Height:         Author: Loyce Dys, MD 06/19/2023 2:46 PM  For on call review [email or web address].

## 2023-06-20 ENCOUNTER — Inpatient Hospital Stay: Payer: BC Managed Care – PPO

## 2023-06-20 ENCOUNTER — Inpatient Hospital Stay: Payer: BC Managed Care – PPO | Admitting: Radiology

## 2023-06-20 DIAGNOSIS — K922 Gastrointestinal hemorrhage, unspecified: Secondary | ICD-10-CM | POA: Diagnosis not present

## 2023-06-20 DIAGNOSIS — R0602 Shortness of breath: Secondary | ICD-10-CM | POA: Diagnosis not present

## 2023-06-20 DIAGNOSIS — R000 Tachycardia, unspecified: Secondary | ICD-10-CM | POA: Diagnosis not present

## 2023-06-20 LAB — CBC WITH DIFFERENTIAL/PLATELET
Abs Immature Granulocytes: 0.22 10*3/uL — ABNORMAL HIGH (ref 0.00–0.07)
Abs Immature Granulocytes: 0.19 10*3/uL — ABNORMAL HIGH (ref 0.00–0.07)
Abs Immature Granulocytes: 0.21 10*3/uL — ABNORMAL HIGH (ref 0.00–0.07)
Basophils Absolute: 0.1 10*3/uL (ref 0.0–0.1)
Basophils Absolute: 0.1 10*3/uL (ref 0.0–0.1)
Basophils Absolute: 0.1 10*3/uL (ref 0.0–0.1)
Basophils Relative: 1 %
Basophils Relative: 1 %
Basophils Relative: 1 %
Eosinophils Absolute: 0.3 10*3/uL (ref 0.0–0.5)
Eosinophils Absolute: 0.2 10*3/uL (ref 0.0–0.5)
Eosinophils Absolute: 0.2 10*3/uL (ref 0.0–0.5)
Eosinophils Relative: 2 %
Eosinophils Relative: 2 %
Eosinophils Relative: 2 %
HCT: 19.6 % — ABNORMAL LOW (ref 39.0–52.0)
HCT: 18 % — ABNORMAL LOW (ref 39.0–52.0)
HCT: 21.8 % — ABNORMAL LOW (ref 39.0–52.0)
Hemoglobin: 6.7 g/dL — ABNORMAL LOW (ref 13.0–17.0)
Hemoglobin: 6.1 g/dL — ABNORMAL LOW (ref 13.0–17.0)
Hemoglobin: 7.4 g/dL — ABNORMAL LOW (ref 13.0–17.0)
Immature Granulocytes: 2 %
Immature Granulocytes: 2 %
Immature Granulocytes: 2 %
Lymphocytes Relative: 17 %
Lymphocytes Relative: 15 %
Lymphocytes Relative: 13 %
Lymphs Abs: 1.9 10*3/uL (ref 0.7–4.0)
Lymphs Abs: 1.6 10*3/uL (ref 0.7–4.0)
Lymphs Abs: 1.8 10*3/uL (ref 0.7–4.0)
MCH: 32.5 pg (ref 26.0–34.0)
MCH: 31 pg (ref 26.0–34.0)
MCH: 30.8 pg (ref 26.0–34.0)
MCHC: 34.2 g/dL (ref 30.0–36.0)
MCHC: 33.9 g/dL (ref 30.0–36.0)
MCHC: 33.9 g/dL (ref 30.0–36.0)
MCV: 95.1 fL (ref 80.0–100.0)
MCV: 91.4 fL (ref 80.0–100.0)
MCV: 90.8 fL (ref 80.0–100.0)
Monocytes Absolute: 1.4 10*3/uL — ABNORMAL HIGH (ref 0.1–1.0)
Monocytes Absolute: 1.1 10*3/uL — ABNORMAL HIGH (ref 0.1–1.0)
Monocytes Absolute: 1.5 10*3/uL — ABNORMAL HIGH (ref 0.1–1.0)
Monocytes Relative: 12 %
Monocytes Relative: 10 %
Monocytes Relative: 11 %
Neutro Abs: 7.7 10*3/uL (ref 1.7–7.7)
Neutro Abs: 7.7 10*3/uL (ref 1.7–7.7)
Neutro Abs: 9.7 10*3/uL — ABNORMAL HIGH (ref 1.7–7.7)
Neutrophils Relative %: 66 %
Neutrophils Relative %: 70 %
Neutrophils Relative %: 71 %
Platelets: 144 10*3/uL — ABNORMAL LOW (ref 150–400)
Platelets: 137 10*3/uL — ABNORMAL LOW (ref 150–400)
Platelets: 155 10*3/uL (ref 150–400)
RBC: 2.06 MIL/uL — ABNORMAL LOW (ref 4.22–5.81)
RBC: 1.97 MIL/uL — ABNORMAL LOW (ref 4.22–5.81)
RBC: 2.4 MIL/uL — ABNORMAL LOW (ref 4.22–5.81)
RDW: 16.9 % — ABNORMAL HIGH (ref 11.5–15.5)
RDW: 17.6 % — ABNORMAL HIGH (ref 11.5–15.5)
RDW: 16.7 % — ABNORMAL HIGH (ref 11.5–15.5)
Smear Review: NORMAL
Smear Review: NORMAL
WBC: 11.6 10*3/uL — ABNORMAL HIGH (ref 4.0–10.5)
WBC: 10.8 10*3/uL — ABNORMAL HIGH (ref 4.0–10.5)
WBC: 13.5 10*3/uL — ABNORMAL HIGH (ref 4.0–10.5)
nRBC: 2.8 % — ABNORMAL HIGH (ref 0.0–0.2)
nRBC: 3.3 % — ABNORMAL HIGH (ref 0.0–0.2)
nRBC: 4.5 % — ABNORMAL HIGH (ref 0.0–0.2)

## 2023-06-20 LAB — HEPATIC FUNCTION PANEL
ALT: 10 U/L (ref 0–44)
AST: 13 U/L — ABNORMAL LOW (ref 15–41)
Albumin: 2.3 g/dL — ABNORMAL LOW (ref 3.5–5.0)
Alkaline Phosphatase: 29 U/L — ABNORMAL LOW (ref 38–126)
Bilirubin, Direct: 0.1 mg/dL (ref 0.0–0.2)
Indirect Bilirubin: 0.7 mg/dL (ref 0.3–0.9)
Total Bilirubin: 0.8 mg/dL (ref <1.2)
Total Protein: 4.1 g/dL — ABNORMAL LOW (ref 6.5–8.1)

## 2023-06-20 LAB — BASIC METABOLIC PANEL
Anion gap: 4 — ABNORMAL LOW (ref 5–15)
Anion gap: 7 (ref 5–15)
BUN: 34 mg/dL — ABNORMAL HIGH (ref 8–23)
BUN: 31 mg/dL — ABNORMAL HIGH (ref 8–23)
CO2: 26 mmol/L (ref 22–32)
CO2: 23 mmol/L (ref 22–32)
Calcium: 7.7 mg/dL — ABNORMAL LOW (ref 8.9–10.3)
Calcium: 7 mg/dL — ABNORMAL LOW (ref 8.9–10.3)
Chloride: 110 mmol/L (ref 98–111)
Chloride: 107 mmol/L (ref 98–111)
Creatinine, Ser: 1.36 mg/dL — ABNORMAL HIGH (ref 0.61–1.24)
Creatinine, Ser: 1.14 mg/dL (ref 0.61–1.24)
GFR, Estimated: 54 mL/min — ABNORMAL LOW (ref >60)
GFR, Estimated: >60 mL/min (ref >60)
Glucose, Bld: 157 mg/dL — ABNORMAL HIGH (ref 70–99)
Glucose, Bld: 168 mg/dL — ABNORMAL HIGH (ref 70–99)
Potassium: 4.1 mmol/L (ref 3.5–5.1)
Potassium: 4.2 mmol/L (ref 3.5–5.1)
Sodium: 140 mmol/L (ref 135–145)
Sodium: 137 mmol/L (ref 135–145)

## 2023-06-20 LAB — TYPE AND SCREEN
ABO/RH(D): A POS
Antibody Screen: NEGATIVE
Unit division: 0
Unit division: 0
Unit division: 0
Unit division: 0
Unit division: 0
Unit division: 0

## 2023-06-20 LAB — PROTIME-INR
INR: 1.2 (ref 0.8–1.2)
Prothrombin Time: 15 s (ref 11.4–15.2)

## 2023-06-20 LAB — BPAM RBC
Blood Product Expiration Date: 202412142359
Blood Product Expiration Date: 202412152359
Blood Product Expiration Date: 202412212359
Blood Product Expiration Date: 202412032359
Blood Product Expiration Date: 202412152359
Blood Product Expiration Date: 202412172359
ISSUE DATE / TIME: 202411161555
ISSUE DATE / TIME: 202411171220
ISSUE DATE / TIME: 202411170448
ISSUE DATE / TIME: 202411180542
ISSUE DATE / TIME: 202411190629
ISSUE DATE / TIME: 202411191348
Unit Type and Rh: 600
Unit Type and Rh: 6200
Unit Type and Rh: 6200
Unit Type and Rh: 6200
Unit Type and Rh: 6200
Unit Type and Rh: 6200

## 2023-06-20 LAB — PREPARE RBC (CROSSMATCH)

## 2023-06-20 LAB — APTT: aPTT: 23 s — ABNORMAL LOW (ref 24–36)

## 2023-06-20 LAB — MAGNESIUM: Magnesium: 2.1 mg/dL (ref 1.7–2.4)

## 2023-06-20 MED ORDER — LIDOCAINE HCL 1 % IJ SOLN
INTRAMUSCULAR | Status: AC
  Filled 2023-06-20: qty 20

## 2023-06-20 MED ORDER — GABAPENTIN 300 MG PO CAPS
300.0000 mg | ORAL_CAPSULE | Freq: Two times a day (BID) | ORAL | Status: DC
  Administered 2023-06-20: 300 mg via ORAL
  Filled 2023-06-20: qty 1

## 2023-06-20 MED ORDER — SODIUM CHLORIDE 0.9% IV SOLUTION: Freq: Once | INTRAVENOUS | Status: AC

## 2023-06-20 MED ORDER — FENTANYL CITRATE (PF) 100 MCG/2ML IJ SOLN
INTRAMUSCULAR | Status: AC
  Filled 2023-06-20: qty 2

## 2023-06-20 MED ORDER — IOHEXOL 350 MG/ML SOLN
100.0000 mL | Freq: Once | INTRAVENOUS | Status: AC | PRN
  Administered 2023-06-20: 100 mL via INTRAVENOUS

## 2023-06-20 MED ORDER — METOPROLOL TARTRATE 5 MG/5ML IV SOLN
5.0000 mg | Freq: Once | INTRAVENOUS | Status: AC
  Administered 2023-06-20: 5 mg via INTRAVENOUS
  Filled 2023-06-20: qty 5

## 2023-06-20 MED ORDER — MIDAZOLAM HCL 2 MG/2ML IJ SOLN
INTRAMUSCULAR | Status: AC
  Filled 2023-06-20: qty 4

## 2023-06-20 MED ORDER — SODIUM CHLORIDE 0.9 % IV BOLUS
1000.0000 mL | Freq: Once | INTRAVENOUS | Status: AC
  Administered 2023-06-20: 500 mL via INTRAVENOUS

## 2023-06-20 MED ORDER — IOHEXOL 300 MG/ML  SOLN
150.0000 mL | Freq: Once | INTRAMUSCULAR | Status: AC | PRN
  Administered 2023-06-21: 105 mL via INTRA_ARTERIAL

## 2023-06-20 MED ORDER — LIDOCAINE HCL 1 % IJ SOLN
10.0000 mL | Freq: Once | INTRAMUSCULAR | Status: AC
  Administered 2023-06-21: 10 mL via INTRADERMAL
  Filled 2023-06-20: qty 10

## 2023-06-20 NOTE — Progress Notes (Signed)
       CROSS COVER NOTE  NAME: Harry Patterson. MRN: [id number] DOB : [birth year]    Concern as stated by nurse / staff   + bleeding scan results     Pertinent findings on chart review: 77 year old male with history of hypertension, hyperlipidemia, GERD, history of CKD stage IIIb, neuropathy, history of stricture of bulbous urethra who presents emergency department from [facility] clinic for chief concerns of low blood pressure and dizziness.  Patient reports of bleeding per rectum.  Patient with stable blood pressures s/p 8 units PRBC transfusion from GI bleed .  Bleeding scan + for small bowel bleed in abdomen  Assessment and  Interventions   Assessment:    06/20/2023    9:40 PM 06/20/2023    8:46 PM 06/20/2023    6:26 PM  Vitals with BMI  Systolic 134 109 253  Diastolic 66 80 60  Pulse  103 664    Plan: Discussed with and consult requested from IR Dr Altamese Bellaire

## 2023-06-20 NOTE — H&P (View-Only) (Signed)
[facility] GI inpatient brief Progress Note  I got a response from [facility] transfer center.  Medical Director Delia Chimes, M.D., after being notified by the transfer center rep Luz Lex), has refused the transfer based upon elevated hospital capacity.  They asked [place] to call back in 24 to 48 hours to reassess their capacity so they may be able to accept the transfer.  This note was placed for documentation purposes and we will continue to push forward for full support here at this institution until transfer can be secured for single or double-balloon enteroscopy.  Vitals:   06/20/23 1728 06/20/23 1738  BP: (!) 172/67 131/61  Pulse: (!) 101 (!) 107  Resp: 18   Temp: 98.1 F (36.7 C)   SpO2: 96% 93%       Labs:    Latest Ref Rng & Units 06/20/2023    4:46 PM 06/20/2023    6:31 AM 06/19/2023    6:01 PM  CBC  WBC 4.0 - 10.5 K/uL 10.8  11.6    Hemoglobin 13.0 - 17.0 g/dL 6.1  6.7  7.3   Hematocrit 39.0 - 52.0 % 18.0  19.6  20.7   Platelets 150 - 400 K/uL 137  144      CMP     Component Value Date/Time   NA 140 06/20/2023 0631   K 4.1 06/20/2023 0631   CL 110 06/20/2023 0631   CO2 26 06/20/2023 0631   GLUCOSE 157 (H) 06/20/2023 0631   BUN 34 (H) 06/20/2023 0631   CREATININE 1.36 (H) 06/20/2023 0631   CALCIUM 7.7 (L) 06/20/2023 0631   GFRNONAA 54 (L) 06/20/2023 0631       T. Bing Plume, M.D. ABIM Diplomate in Gastroenterology [facility] [phone] - Cell

## 2023-06-20 NOTE — Progress Notes (Signed)
Progress Note   Patient: Harry Patterson. NWG:[id number] DOB: May 27, 1946 DOA: 06/16/2023     4 DOS: the patient was seen and examined on 06/20/2023     Brief hospital course: From HPI "Harry Patterson is a 77 year old male with history of hypertension, hyperlipidemia, GERD, history of CKD stage IIIb, neuropathy, history of stricture of bulbous urethra who presents emergency department from [facility] clinic for chief concerns of low blood pressure and dizziness.  Patient reports of bleeding per rectum. Gastroenterologist following.   Assessment and Plan: * Upper GI bleed Symptomatic anemia Patient is s/p 6 units of blood transfusion   Underwent EGD that detected Barrett's esophagus with no obvious signs of bleeding noted Continue with IV Protonix Underwent colonoscopy on 06/18/2023 Patient underwent capsule endoscopy on 06/19/2023 showing findings of active bleeding Case discussed with gastroenterologist as well as interventional radiologist Interventional radiologist recommended CTA with GI protocol that did not show any active bleeding done on 06/19/2023. Hemoglobin continues to downtrend Patient has received another 1 more unit of blood transfusion today (06/20/2023) in the morning however repeat H&H still showed Hb of 6.1. I have requested for 1 more unit transfusion today making a total of 2 today. Patient does not complain of any active observable bleeding today. This was discussed with IR as well as gastroenterologist today. GI has made a call to [facility] for possible transfer for possible single versus double-balloon enteroscopy and awaiting a callback. Currently on clear liquid diet as recommended by gastroenterologist  Acute kidney injury Creatinine have worsened from 1.0-1.36 today A bolus of NS given We will monitor renal function closely Avoid nephrotoxic agents  Suprapubic tenderness Patient had cystoscopy with balloon urethral dilatation  on 11/5 UA with no concern of UTI.   Essential hypertension Continue home dose of metoprolol Continue hydralazine 5 mg IV every 6 hours as needed for SBP greater 175, 7 days ordered Monitor blood pressure closely  Polyneuropathy Continue gabapentin   Alcohol use Counseled on cessation Continue CIWA protocol   Pure hypercholesterolemia Continue Home atorvastatin 20 mg nightly resumed   Benign prostatic hyperplasia without lower urinary tract symptoms Home Flomax resumed for 11/17 Continue oxybutynin 5 mg every 8 hours as needed bladder spasms   B12 deficiency -Continue home B12 supplement     Subjective:  Patient seen and examined at bedside this morning in the presence of the family Denies any bleeding per rectum nausea vomiting or abdominal pain He did complain of feeling thirsty I discussed worsening of his renal function with him  Physical Exam:   General.  Well-developed elderly man, in no acute distress. Pulmonary.  Lungs clear bilaterally, normal respiratory effort. CV.  Regular rate and rhythm, no JVD, rub or murmur. Abdomen.  Soft, nontender, nondistended, BS positive. CNS.  Alert and oriented .  No focal neurologic deficit. Extremities.  No edema, no cyanosis, pulses intact and symmetrical. Psychiatry.  Judgment and insight appears normal.      Family Communication: Discussed with wife at bedside   Disposition: Home when medically stable, currently having active bleed   Time spent:   Data reviewed:     Latest Ref Rng & Units 06/20/2023    4:46 PM 06/20/2023    6:31 AM 06/19/2023    6:01 PM  CBC  WBC 4.0 - 10.5 K/uL 10.8  11.6    Hemoglobin 13.0 - 17.0 g/dL 6.1  6.7  7.3   Hematocrit 39.0 - 52.0 % 18.0  19.6  20.7  Platelets 150 - 400 K/uL 137  144         Latest Ref Rng & Units 06/20/2023    6:31 AM 06/19/2023    4:45 AM 06/18/2023    4:16 AM  BMP  Glucose 70 - 99 mg/dL 295  188  416   BUN 8 - 23 mg/dL 34  29  48   Creatinine  0.61 - 1.24 mg/dL 6.06  3.01  6.01   Sodium 135 - 145 mmol/L 140  139  141   Potassium 3.5 - 5.1 mmol/L 4.1  3.8  3.9   Chloride 98 - 111 mmol/L 110  106  111   CO2 22 - 32 mmol/L 26  25  23    Calcium 8.9 - 10.3 mg/dL 7.7  7.7  7.9     Vitals:   06/20/23 1213 06/20/23 1216 06/20/23 1431 06/20/23 1439  BP: (!) 131/55 (!) 131/55 127/61 (!) 111/58  Pulse: (!) 105 (!) 105 (!) 106 (!) 107  Resp:  18 20 18   Temp: 98.7 F (37.1 C) 98.7 F (37.1 C) 98.7 F (37.1 C) 98.7 F (37.1 C)  TempSrc: Oral Axillary Oral   SpO2: 95% 94% 94% 95%  Weight:      Height:         Author: Loyce Dys, MD 06/20/2023 5:23 PM  For on call review [email or web address].

## 2023-06-20 NOTE — Progress Notes (Signed)
[facility] Gastroenterology Inpatient Progress Note    Subjective: Patient seen for f/u overt small bowel bleeding. There has been some slowing of bleeding overnight. No BM's yet today. Passing flatus, no blood or fecal matter. Tolerating po clear liquids.  Objective: Vital signs in last 24 hours: Temp:  [97.7 F (36.5 C)-99.6 F (37.6 C)] 98.7 F (37.1 C) (11/20 1439) Pulse Rate:  [88-110] 107 (11/20 1439) Resp:  [16-20] 18 (11/20 1439) BP: (103-141)/(54-73) 111/58 (11/20 1439) SpO2:  [92 %-96 %] 95 % (11/20 1439) Blood pressure (!) 111/58, pulse (!) 107, temperature 98.7 F (37.1 C), resp. rate 18, height 5\' 8"  (1.727 m), weight 86.2 kg, SpO2 95%.    Intake/Output from previous day: 11/19 0701 - 11/20 0700 In: 650 [Blood:650] Out: 300 [Urine:300]  Intake/Output this shift: Total I/O In: 332.5 [Blood:332.5] Out: -    Gen: NAD. Appears comfortable but fatigued and pale.  HEENT: [place]/AT. PERRLA. Normal external ear exam.  Chest: CTA, no wheezes.  CV: RR nl S1, S2. No gallops.  Abd: soft, nt, nd. BS+  Ext: no edema. Pulses 2+  Neuro: Alert and oriented. Judgement appears normal. Nonfocal.   Lab Results: Results for orders placed or performed during the hospital encounter of 06/16/23 (from the past 24 hour(s))  Hemoglobin and hematocrit, blood     Status: Abnormal   Collection Time: 06/19/23  6:01 PM  Result Value Ref Range   Hemoglobin 7.3 (L) 13.0 - 17.0 g/dL   HCT 16.1 (L) 09.6 - 04.5 %  CBC with Differential/Platelet     Status: Abnormal   Collection Time: 06/20/23  6:31 AM  Result Value Ref Range   WBC 11.6 (H) 4.0 - 10.5 K/uL   RBC 2.06 (L) 4.22 - 5.81 MIL/uL   Hemoglobin 6.7 (L) 13.0 - 17.0 g/dL   HCT 40.9 (L) 81.1 - 91.4 %   MCV 95.1 80.0 - 100.0 fL   MCH 32.5 26.0 - 34.0 pg   MCHC 34.2 30.0 - 36.0 g/dL   RDW 78.2 (H) 95.6 - 21.3 %   Platelets 144 (L) 150 - 400 K/uL   nRBC 2.8 (H) 0.0 - 0.2 %   Neutrophils Relative % 66 %   Neutro Abs 7.7 1.7  - 7.7 K/uL   Lymphocytes Relative 17 %   Lymphs Abs 1.9 0.7 - 4.0 K/uL   Monocytes Relative 12 %   Monocytes Absolute 1.4 (H) 0.1 - 1.0 K/uL   Eosinophils Relative 2 %   Eosinophils Absolute 0.3 0.0 - 0.5 K/uL   Basophils Relative 1 %   Basophils Absolute 0.1 0.0 - 0.1 K/uL   Immature Granulocytes 2 %   Abs Immature Granulocytes 0.22 (H) 0.00 - 0.07 K/uL  Basic metabolic panel     Status: Abnormal   Collection Time: 06/20/23  6:31 AM  Result Value Ref Range   Sodium 140 135 - 145 mmol/L   Potassium 4.1 3.5 - 5.1 mmol/L   Chloride 110 98 - 111 mmol/L   CO2 26 22 - 32 mmol/L   Glucose, Bld 157 (H) 70 - 99 mg/dL   BUN 34 (H) 8 - 23 mg/dL   Creatinine, Ser 0.86 (H) 0.61 - 1.24 mg/dL   Calcium 7.7 (L) 8.9 - 10.3 mg/dL   GFR, Estimated 54 (L) >60 mL/min   Anion gap 4 (L) 5 - 15  Prepare RBC (crossmatch)     Status: None   Collection Time: 06/20/23  8:24 AM  Result Value Ref Range  Order Confirmation      ORDER PROCESSED BY BLOOD BANK Performed at [facility], [address] [place], [place] [address]   Type and screen [facility]     Status: None (Preliminary result)   Collection Time: 06/20/23 10:27 AM  Result Value Ref Range   ABO/RH(D) A POS    Antibody Screen NEG    Sample Expiration 06/23/2023,2359    Unit Number [id number]    Blood Component Type RED CELLS,LR    Unit division 00    Status of Unit ISSUED    Transfusion Status OK TO TRANSFUSE    Crossmatch Result      Compatible Performed at [facility], [address] [place], [place] [address]      Recent Labs    06/18/23 0416 06/18/23 0831 06/19/23 0445 06/19/23 1147 06/19/23 1801 06/20/23 0631  WBC 10.4  --  9.9  --   --  11.6*  HGB 6.5*   < > 6.0* 6.4* 7.3* 6.7*  HCT 18.8*   < > 17.6* 18.4* 20.7* 19.6*  PLT 146*  --  144*  --   --  144*   < > = values in this interval not displayed.   BMET Recent Labs    06/18/23 0416 06/19/23 0445 06/20/23 0631   NA 141 139 140  K 3.9 3.8 4.1  CL 111 106 110  CO2 23 25 26   GLUCOSE 150* 144* 157*  BUN 48* 29* 34*  CREATININE 1.31* 1.05 1.36*  CALCIUM 7.9* 7.7* 7.7*   LFT No results for input(s): "PROT", "ALBUMIN", "AST", "ALT", "ALKPHOS", "BILITOT", "BILIDIR", "IBILI" in the last 72 hours. PT/INR No results for input(s): "LABPROT", "INR" in the last 72 hours. Hepatitis Panel No results for input(s): "HEPBSAG", "HCVAB", "HEPAIGM", "HEPBIGM" in the last 72 hours. C-Diff No results for input(s): "CDIFFTOX" in the last 72 hours. No results for input(s): "CDIFFPCR" in the last 72 hours.   Studies/Results: CT Angio Abd/Pel w/ and/or w/o  Result Date: 06/19/2023 CLINICAL DATA:  Lower GI bleed EXAM: CTA ABDOMEN AND PELVIS WITHOUT AND WITH CONTRAST TECHNIQUE: Multidetector CT imaging of the abdomen and pelvis was performed using the standard protocol during bolus administration of intravenous contrast. Multiplanar reconstructed images and MIPs were obtained and reviewed to evaluate the vascular anatomy. RADIATION DOSE REDUCTION: This exam was performed according to the departmental dose-optimization program which includes automated exposure control, adjustment of the mA and/or kV according to patient size and/or use of iterative reconstruction technique. CONTRAST:  OMNIPAQUE IOHEXOL 350 MG/ML SOLN COMPARISON:  None Available. FINDINGS: VASCULAR Aorta: Normal caliber aorta without aneurysm, dissection, vasculitis or significant stenosis. Mild scattered atherosclerotic plaque. Celiac: Patent without evidence of aneurysm, dissection, vasculitis or significant stenosis. The common hepatic artery is replaced to the SMA. SMA: Patent without evidence of aneurysm, dissection, vasculitis or significant stenosis. Replaced common hepatic artery. Renals: Both renal arteries are patent without evidence of aneurysm, dissection, vasculitis, fibromuscular dysplasia or significant stenosis. IMA: Patent without evidence  of aneurysm, dissection, vasculitis or significant stenosis. Inflow: Patent without evidence of aneurysm, dissection, vasculitis or significant stenosis. Proximal Outflow: Bilateral common femoral and visualized portions of the superficial and profunda femoral arteries are patent without evidence of aneurysm, dissection, vasculitis or significant stenosis. Veins: No focal venous abnormality. Review of the MIP images confirms the above findings. NON-VASCULAR Lower chest: The intracardiac blood pool is hypodense relative to the adjacent myocardium consistent with anemia. Hepatobiliary: No focal liver abnormality is seen. No  gallstones, gallbladder wall thickening, or biliary dilatation. Pancreas: Unremarkable. No pancreatic ductal dilatation or surrounding inflammatory changes. Spleen: Normal in size without focal abnormality. Adrenals/Urinary Tract: Normal adrenal glands. No hydronephrosis, nephrolithiasis or enhancing renal mass. Multiple water attenuation renal lesions bilaterally consistent with simple cysts. The ureters and bladder are unremarkable. Stomach/Bowel: No evidence of extravasation of contrast material into the stomach, large bowel or small bowel. An endoscopic capsule is present within the cecum. No focal bowel wall thickening or evidence of obstruction. Lymphatic: No suspicious lymphadenopathy. Reproductive: Prostate is unremarkable. Other: Fat containing left inguinal hernia. Musculoskeletal: No acute fracture or aggressive appearing lytic or blastic osseous lesion. IMPRESSION: 1. Negative for active GI bleed at this time. 2. The intracardiac blood pool is hypodense relative to the adjacent myocardium consistent with anemia. 3. Endoscopic capsule present within the cecum. 4. No focal bowel wall thickening, significant diverticulosis or evidence of obstruction. 5. Omental fat containing left inguinal hernia. 6. Common hepatic artery is replaced to the SMA. Aortic Atherosclerosis (ICD10-I70.0).  Signed, Sterling Big, MD, RPVI [facility] Radiology Electronically Signed   By: Malachy Moan M.D.   On: 06/19/2023 16:02    Scheduled Inpatient Medications:    ascorbic acid  500 mg Oral QHS   atorvastatin  20 mg Oral QHS   cyanocobalamin  1,000 mcg Oral QHS   folic acid  2 mg Oral q AM   metoprolol succinate  100 mg Oral q AM   pantoprazole (PROTONIX) IV  40 mg Intravenous Q12H   tamsulosin  0.4 mg Oral Daily    Continuous Inpatient Infusions:    PRN Inpatient Medications:  acetaminophen **OR** acetaminophen, hydrALAZINE, LORazepam, metoprolol tartrate, ondansetron **OR** ondansetron (ZOFRAN) IV, oxybutynin, senna-docusate  Miscellaneous:   Assessment:  Melena -Secondary to mid-small bowel vessel vs ulcer. Active bleeding during the study made visualization of the site difficult.  Hx of Cox-2 inhibitor usage (Celebrex). He stopped it coincidentally 2 weeks ago. Hx Barrett's esophagus without dysplasia - EGD January 16, 2023 - Dr. Mia Creek. GERD, stable. Left bundle branch block. Hypertension - Blood pressure controlled. Anemia secondary to gastrointestinal blood loss. Hgb 6.7 today , undergoing blood transfusion presently. F/E/N - Tolerating clear liquid diet.  Plan:  Continue serial H/H. Continue prbc transfusion to keep up with blood loss for now.  I called [facility] Transfer line to request transfer for possible single vs. Double balloon enteroscopy. 4.   Will await a call back for potential transfer, particularly if bleeding resumes.  5.   Following closely.   Roanna Reaves K. Norma Fredrickson, M.D. 06/20/2023, 3:59 PM

## 2023-06-20 NOTE — Progress Notes (Signed)
Vascular and Interventional Radiology  PRE PROCEDURE Note  Assessment  Plan:   Mr. Kerney Nihart. is a 77 y.o. year old male who will undergo MESENTERIC ANGIOGRAM, POSSIBLE EMBOLIZATION in Interventional Radiology.  The procedure has been fully reviewed with the patient/patient's authorized representative. The risks, benefits and alternatives have been explained, and the patient/patient's authorized representative has consented to the procedure. -- The patient does not have a Do Not Resuscitate order in effect.  HPI: Mr. Jarrid Lorance. is a 77 y.o. year old male comorbid who presented w melena. Prior GIB study yesterday (06/19/23). Repeat scan today POSITIVE for acute extravasation within small bowel. VIR On Call contacted by cross cover Medicine for evaluation and embolization. Pt remains VSS / HDS.  Informed consent was obtained, witnessed and placed in the patient's chart.  Imaging independently reviewed, demonstrating active extravasation at mid-small bowel. SMA vascular distributio.    Allergies:  Allergies  Allergen Reactions   Amoxicillin Other (See Comments)    Oral thrush   Other Swelling    Pneumonia vaccine-swollen/painful/fever chills   Amlodipine Other (See Comments)    EDEMA    Medications:  No current facility-administered medications on file prior to encounter.   Current Outpatient Medications on File Prior to Encounter  Medication Sig Dispense Refill   acetaminophen (TYLENOL) 500 MG tablet Take 1,000 mg by mouth in the morning and at bedtime.     ALPHA LIPOIC ACID PO Take 1 capsule by mouth in the morning.     aspirin EC 81 MG tablet Take 81 mg by mouth at bedtime.     atorvastatin (LIPITOR) 20 MG tablet Take 20 mg by mouth at bedtime.  11   celecoxib (CELEBREX) 200 MG capsule Take 200 mg by mouth at bedtime.  11   chlorthalidone (HYGROTON) 25 MG tablet Take 25 mg by mouth in the morning.     Cholecalciferol 1000 units capsule Take 2,000  Units by mouth at bedtime.     diphenoxylate-atropine (LOMOTIL) 2.5-0.025 MG tablet Take 1 tablet by mouth 4 (four) times daily as needed for diarrhea or loose stools.     folic acid (FOLVITE) 1 MG tablet Take 2 mg by mouth in the morning.     gabapentin (NEURONTIN) 300 MG capsule Take 300 mg by mouth in the morning and at bedtime.     losartan (COZAAR) 100 MG tablet Take 50 mg by mouth 2 (two) times daily.     metoprolol succinate (TOPROL-XL) 100 MG 24 hr tablet Take 100 mg by mouth in the morning.     omega-3 acid ethyl esters (LOVAZA) 1 g capsule Take 1 g by mouth in the morning and at bedtime.  3   oxybutynin (DITROPAN) 5 MG tablet 1 tab tid prn frequency,urgency, bladder spasm 10 tablet 0   pantoprazole (PROTONIX) 40 MG tablet Take 40 mg by mouth at bedtime.     spironolactone (ALDACTONE) 100 MG tablet Take 100 mg by mouth in the morning.     tamsulosin (FLOMAX) 0.4 MG CAPS capsule Take 1 capsule (0.4 mg total) by mouth daily. 30 capsule 6   triamcinolone ointment (KENALOG) 0.1 % Apply 1 Application topically 2 (two) times daily as needed (leg irritation/rash.).     vitamin B-12 (CYANOCOBALAMIN) 1000 MCG tablet Take 1,000 mcg by mouth at bedtime.     vitamin C (ASCORBIC ACID) 500 MG tablet Take 500 mg by mouth at bedtime.      PSH:  Past Surgical History:  Procedure Laterality Date   APPENDECTOMY     BIOPSY  01/16/2023   Procedure: BIOPSY;  Surgeon: Regis Bill, MD;  Location: [facility] ENDOSCOPY;  Service: Endoscopy;;   CATARACT EXTRACTION W/PHACO Right 06/11/2019   Procedure: CATARACT EXTRACTION PHACO AND INTRAOCULAR LENS PLACEMENT (IOC) RIGHT 00:54.6     22.0%    12.00;  Surgeon: Lockie Mola, MD;  Location: [facility] SURGERY CNTR;  Service: Ophthalmology;  Laterality: Right;  sleep apnea   CATARACT EXTRACTION W/PHACO Left 06/30/2019   Procedure: CATARACT EXTRACTION PHACO AND INTRAOCULAR LENS PLACEMENT (IOC) LEFT TORIC LENS 9.97  01:09.1  14.4%;  Surgeon: Lockie Mola, MD;  Location: [facility] SURGERY CNTR;  Service: Ophthalmology;  Laterality: Left;   COLONOSCOPY N/A 06/18/2023   Procedure: COLONOSCOPY;  Surgeon: Toledo, Boykin Nearing, MD;  Location: [facility] ENDOSCOPY;  Service: Gastroenterology;  Laterality: N/A;   COLONOSCOPY W/ POLYPECTOMY     02/08/00,11'12'04,05/05/05, 08/21/2008, 12/05/2013, 05/08/2014   COLONOSCOPY WITH PROPOFOL N/A 06/06/2017   Procedure: COLONOSCOPY WITH PROPOFOL;  Surgeon: Scot Jun, MD;  Location: [facility] ENDOSCOPY;  Service: Endoscopy;  Laterality: N/A;   COLONOSCOPY WITH PROPOFOL N/A 01/16/2023   Procedure: COLONOSCOPY WITH PROPOFOL;  Surgeon: Regis Bill, MD;  Location: [facility] ENDOSCOPY;  Service: Endoscopy;  Laterality: N/A;   cyst removed  Right    thumb   CYSTOSCOPY WITH URETHRAL DILATATION N/A 06/05/2023   Procedure: CYSTOSCOPY WITH URETHRAL BALLOON DILATATION USING OPTILUME;  Surgeon: Riki Altes, MD;  Location: [facility] ORS;  Service: Urology;  Laterality: N/A;   ESOPHAGOGASTRODUODENOSCOPY     x7   ESOPHAGOGASTRODUODENOSCOPY N/A 06/17/2023   Procedure: ESOPHAGOGASTRODUODENOSCOPY (EGD);  Surgeon: Toledo, Boykin Nearing, MD;  Location: [facility] ENDOSCOPY;  Service: Gastroenterology;  Laterality: N/A;   ESOPHAGOGASTRODUODENOSCOPY (EGD) WITH PROPOFOL N/A 06/06/2017   Procedure: ESOPHAGOGASTRODUODENOSCOPY (EGD) WITH PROPOFOL;  Surgeon: Scot Jun, MD;  Location: [facility] ENDOSCOPY;  Service: Endoscopy;  Laterality: N/A;   ESOPHAGOGASTRODUODENOSCOPY (EGD) WITH PROPOFOL N/A 11/27/2019   Procedure: ESOPHAGOGASTRODUODENOSCOPY (EGD) WITH PROPOFOL;  Surgeon: Toledo, Boykin Nearing, MD;  Location: [facility] ENDOSCOPY;  Service: Gastroenterology;  Laterality: N/A;   ESOPHAGOGASTRODUODENOSCOPY (EGD) WITH PROPOFOL N/A 01/16/2023   Procedure: ESOPHAGOGASTRODUODENOSCOPY (EGD) WITH PROPOFOL;  Surgeon: Regis Bill, MD;  Location: [facility] ENDOSCOPY;  Service: Endoscopy;  Laterality: N/A;   HEMORRHOID SURGERY     TONSILLECTOMY      PMH:   Past Medical History:  Diagnosis Date   Adenomatous colon polyp    Allergic rhinitis    Arthritis    Avascular necrosis of bones of both hips (HCC)    B12 deficiency    Barrett's esophagus without dysplasia    Bilateral inguinal hernia    Bilateral renal cysts    CAD (coronary artery disease)    Chronic prostatitis    DDD (degenerative disc disease), lumbar    Enlarged prostate    Erectile dysfunction    a.) on PDE5i (sildenafil)   GERD (gastroesophageal reflux disease)    History of bilateral cataract extraction 2020   HLD (hyperlipidemia)    HTN (hypertension)    Hypertension    LBBB (left bundle branch block)    Leukopenia    Long term current use of aspirin    OSA on CPAP    Peripheral neuropathy    Prediabetes    Skin cancer    Substance abuse (HCC)    Vitamin D deficiency     Brief Physical Examination: Vitals:   06/20/23 2046 06/20/23 2140  BP: 109/80 134/66  Pulse: (!) 103  Resp: 18 20  Temp: 98.3 F (36.8 C) 97.7 F (36.5 C)  SpO2: 95% 96%   General: WD, WN male in NAD HEENT: Normocephalic, atraumatic Lungs: Respirations non-labored  ASA Grade: 3 Mallampati Class: 3   Roanna Banning, MD [facility] Radiology   Pager. [phone] Clinic. [phone]

## 2023-06-20 NOTE — Progress Notes (Signed)
[facility] GI inpatient brief Progress Note  I got a response from [facility] transfer center.  Medical Director Delia Chimes, M.D., after being notified by the transfer center rep Luz Lex), has refused the transfer based upon elevated hospital capacity.  They asked [place] to call back in 24 to 48 hours to reassess their capacity so they may be able to accept the transfer.  This note was placed for documentation purposes and we will continue to push forward for full support here at this institution until transfer can be secured for single or double-balloon enteroscopy.  Vitals:   06/20/23 1728 06/20/23 1738  BP: (!) 172/67 131/61  Pulse: (!) 101 (!) 107  Resp: 18   Temp: 98.1 F (36.7 C)   SpO2: 96% 93%       Labs:    Latest Ref Rng & Units 06/20/2023    4:46 PM 06/20/2023    6:31 AM 06/19/2023    6:01 PM  CBC  WBC 4.0 - 10.5 K/uL 10.8  11.6    Hemoglobin 13.0 - 17.0 g/dL 6.1  6.7  7.3   Hematocrit 39.0 - 52.0 % 18.0  19.6  20.7   Platelets 150 - 400 K/uL 137  144      CMP     Component Value Date/Time   NA 140 06/20/2023 0631   K 4.1 06/20/2023 0631   CL 110 06/20/2023 0631   CO2 26 06/20/2023 0631   GLUCOSE 157 (H) 06/20/2023 0631   BUN 34 (H) 06/20/2023 0631   CREATININE 1.36 (H) 06/20/2023 0631   CALCIUM 7.7 (L) 06/20/2023 0631   GFRNONAA 54 (L) 06/20/2023 0631       T. Bing Plume, M.D. ABIM Diplomate in Gastroenterology [facility] [phone] - Cell

## 2023-06-20 NOTE — Progress Notes (Signed)
At approximately 1715, this nurse was assisting pt to BR. Pt ambulated with RW. Once pt sat on toilet he stated he felt dizzy, he was noted to be "straining" to void/have BM. During this pt became unresponsive and limp and urinated outwards and onto floor. Pt was not responding to sternal rubbing and during assessment pulse was thready then not palpable(radial and carotid) This nurse yelled out for assist from other staff and to call code blue. As this nurse was holding onto around shoulders and attempting to lower pt to floor, pt aroused and started speaking.Pt became diaphoretic and stated he felt "dizzy" and stated he had lost consciousness during that episode. After a short period of time, dizziness resolved and pt was assisted back to bed with multiple staff members assisting. Pt tolerated fairly with reports of some dizziness which resolved with rest when back in bed. Of note, pt voided large amount of yellow urine onto bathroom floor and blood in toilet.

## 2023-06-20 NOTE — Plan of Care (Signed)

## 2023-06-21 ENCOUNTER — Other Ambulatory Visit: Payer: Self-pay

## 2023-06-21 ENCOUNTER — Inpatient Hospital Stay (HOSPITAL_COMMUNITY)
Admission: AD | Admit: 2023-06-21 | Discharge: 2023-06-29 | DRG: 377 | Disposition: A | Discharge status: Home Health | Payer: BC Managed Care – PPO | Source: Other Acute Inpatient Hospital | Attending: Family Medicine | Admitting: Family Medicine

## 2023-06-21 ENCOUNTER — Encounter
Admission: EM | Disposition: A | Discharge status: Other Acute Inpatient Hospital | Payer: Self-pay | Source: Home / Self Care | Attending: Internal Medicine

## 2023-06-21 ENCOUNTER — Encounter (HOSPITAL_COMMUNITY): Payer: Self-pay

## 2023-06-21 DIAGNOSIS — K2270 Barrett's esophagus without dysplasia: Secondary | ICD-10-CM | POA: Diagnosis not present

## 2023-06-21 DIAGNOSIS — R6511 Systemic inflammatory response syndrome (SIRS) of non-infectious origin with acute organ dysfunction: Secondary | ICD-10-CM | POA: Diagnosis present

## 2023-06-21 DIAGNOSIS — M1990 Unspecified osteoarthritis, unspecified site: Secondary | ICD-10-CM | POA: Diagnosis present

## 2023-06-21 DIAGNOSIS — K921 Melena: Secondary | ICD-10-CM | POA: Diagnosis not present

## 2023-06-21 DIAGNOSIS — Z888 Allergy status to other drugs, medicaments and biological substances status: Secondary | ICD-10-CM

## 2023-06-21 DIAGNOSIS — Z85828 Personal history of other malignant neoplasm of skin: Secondary | ICD-10-CM

## 2023-06-21 DIAGNOSIS — D62 Acute posthemorrhagic anemia: Secondary | ICD-10-CM | POA: Diagnosis present

## 2023-06-21 DIAGNOSIS — Z961 Presence of intraocular lens: Secondary | ICD-10-CM | POA: Diagnosis present

## 2023-06-21 DIAGNOSIS — D72829 Elevated white blood cell count, unspecified: Secondary | ICD-10-CM | POA: Diagnosis present

## 2023-06-21 DIAGNOSIS — K219 Gastro-esophageal reflux disease without esophagitis: Secondary | ICD-10-CM | POA: Diagnosis present

## 2023-06-21 DIAGNOSIS — N400 Enlarged prostate without lower urinary tract symptoms: Secondary | ICD-10-CM | POA: Diagnosis present

## 2023-06-21 DIAGNOSIS — I959 Hypotension, unspecified: Secondary | ICD-10-CM | POA: Diagnosis present

## 2023-06-21 DIAGNOSIS — K31819 Angiodysplasia of stomach and duodenum without bleeding: Secondary | ICD-10-CM | POA: Diagnosis present

## 2023-06-21 DIAGNOSIS — G629 Polyneuropathy, unspecified: Secondary | ICD-10-CM

## 2023-06-21 DIAGNOSIS — I2510 Atherosclerotic heart disease of native coronary artery without angina pectoris: Secondary | ICD-10-CM | POA: Diagnosis present

## 2023-06-21 DIAGNOSIS — G4733 Obstructive sleep apnea (adult) (pediatric): Secondary | ICD-10-CM | POA: Diagnosis present

## 2023-06-21 DIAGNOSIS — K5730 Diverticulosis of large intestine without perforation or abscess without bleeding: Secondary | ICD-10-CM | POA: Diagnosis present

## 2023-06-21 DIAGNOSIS — I471 Supraventricular tachycardia, unspecified: Secondary | ICD-10-CM | POA: Diagnosis not present

## 2023-06-21 DIAGNOSIS — D649 Anemia, unspecified: Secondary | ICD-10-CM | POA: Diagnosis present

## 2023-06-21 DIAGNOSIS — K317 Polyp of stomach and duodenum: Secondary | ICD-10-CM | POA: Diagnosis present

## 2023-06-21 DIAGNOSIS — E538 Deficiency of other specified B group vitamins: Secondary | ICD-10-CM | POA: Diagnosis present

## 2023-06-21 DIAGNOSIS — I10 Essential (primary) hypertension: Secondary | ICD-10-CM | POA: Diagnosis present

## 2023-06-21 DIAGNOSIS — F1010 Alcohol abuse, uncomplicated: Secondary | ICD-10-CM | POA: Diagnosis present

## 2023-06-21 DIAGNOSIS — N179 Acute kidney failure, unspecified: Secondary | ICD-10-CM | POA: Diagnosis present

## 2023-06-21 DIAGNOSIS — Z79899 Other long term (current) drug therapy: Secondary | ICD-10-CM

## 2023-06-21 DIAGNOSIS — Z880 Allergy status to penicillin: Secondary | ICD-10-CM | POA: Diagnosis not present

## 2023-06-21 DIAGNOSIS — Z9842 Cataract extraction status, left eye: Secondary | ICD-10-CM

## 2023-06-21 DIAGNOSIS — K5751 Diverticulosis of both small and large intestine without perforation or abscess with bleeding: Secondary | ICD-10-CM | POA: Diagnosis not present

## 2023-06-21 DIAGNOSIS — E7800 Pure hypercholesterolemia, unspecified: Secondary | ICD-10-CM | POA: Diagnosis present

## 2023-06-21 DIAGNOSIS — Z8249 Family history of ischemic heart disease and other diseases of the circulatory system: Secondary | ICD-10-CM | POA: Diagnosis not present

## 2023-06-21 DIAGNOSIS — I447 Left bundle-branch block, unspecified: Secondary | ICD-10-CM | POA: Diagnosis present

## 2023-06-21 DIAGNOSIS — Z860101 Personal history of adenomatous and serrated colon polyps: Secondary | ICD-10-CM | POA: Diagnosis not present

## 2023-06-21 DIAGNOSIS — K922 Gastrointestinal hemorrhage, unspecified: Secondary | ICD-10-CM | POA: Diagnosis not present

## 2023-06-21 DIAGNOSIS — K5521 Angiodysplasia of colon with hemorrhage: Secondary | ICD-10-CM | POA: Diagnosis present

## 2023-06-21 DIAGNOSIS — K2970 Gastritis, unspecified, without bleeding: Secondary | ICD-10-CM | POA: Diagnosis present

## 2023-06-21 DIAGNOSIS — K649 Unspecified hemorrhoids: Secondary | ICD-10-CM | POA: Diagnosis present

## 2023-06-21 DIAGNOSIS — N411 Chronic prostatitis: Secondary | ICD-10-CM | POA: Diagnosis present

## 2023-06-21 DIAGNOSIS — R7303 Prediabetes: Secondary | ICD-10-CM | POA: Diagnosis present

## 2023-06-21 DIAGNOSIS — Z9841 Cataract extraction status, right eye: Secondary | ICD-10-CM

## 2023-06-21 DIAGNOSIS — R001 Bradycardia, unspecified: Secondary | ICD-10-CM | POA: Diagnosis present

## 2023-06-21 HISTORY — PX: IR ANGIOGRAM VISCERAL SELECTIVE: IMG657

## 2023-06-21 LAB — CBC WITH DIFFERENTIAL/PLATELET
Abs Immature Granulocytes: 0.28 10*3/uL — ABNORMAL HIGH (ref 0.00–0.07)
Basophils Absolute: 0.1 10*3/uL (ref 0.0–0.1)
Basophils Relative: 0 %
Eosinophils Absolute: 0.2 10*3/uL (ref 0.0–0.5)
Eosinophils Relative: 2 %
HCT: 20.3 % — ABNORMAL LOW (ref 39.0–52.0)
Hemoglobin: 6.9 g/dL — ABNORMAL LOW (ref 13.0–17.0)
Immature Granulocytes: 2 %
Lymphocytes Relative: 10 %
Lymphs Abs: 1.5 10*3/uL (ref 0.7–4.0)
MCH: 31.7 pg (ref 26.0–34.0)
MCHC: 34 g/dL (ref 30.0–36.0)
MCV: 93.1 fL (ref 80.0–100.0)
Monocytes Absolute: 1.3 10*3/uL — ABNORMAL HIGH (ref 0.1–1.0)
Monocytes Relative: 9 %
Neutro Abs: 11.1 10*3/uL — ABNORMAL HIGH (ref 1.7–7.7)
Neutrophils Relative %: 77 %
Platelets: 148 10*3/uL — ABNORMAL LOW (ref 150–400)
RBC: 2.18 MIL/uL — ABNORMAL LOW (ref 4.22–5.81)
RDW: 17.8 % — ABNORMAL HIGH (ref 11.5–15.5)
Smear Review: NORMAL
WBC: 14.4 10*3/uL — ABNORMAL HIGH (ref 4.0–10.5)
nRBC: 4.2 % — ABNORMAL HIGH (ref 0.0–0.2)

## 2023-06-21 LAB — CBC
HCT: 20 % — ABNORMAL LOW (ref 39.0–52.0)
Hemoglobin: 6.3 g/dL — CL (ref 13.0–17.0)
MCH: 31 pg (ref 26.0–34.0)
MCHC: 31.5 g/dL (ref 30.0–36.0)
MCV: 98.5 fL (ref 80.0–100.0)
Platelets: 166 10*3/uL (ref 150–400)
RBC: 2.03 MIL/uL — ABNORMAL LOW (ref 4.22–5.81)
RDW: 19.2 % — ABNORMAL HIGH (ref 11.5–15.5)
WBC: 14.9 10*3/uL — ABNORMAL HIGH (ref 4.0–10.5)
nRBC: 5.4 % — ABNORMAL HIGH (ref 0.0–0.2)

## 2023-06-21 LAB — HEMOGLOBIN AND HEMATOCRIT, BLOOD
HCT: 23.4 % — ABNORMAL LOW (ref 39.0–52.0)
Hemoglobin: 8 g/dL — ABNORMAL LOW (ref 13.0–17.0)

## 2023-06-21 LAB — BASIC METABOLIC PANEL
Anion gap: 7 (ref 5–15)
BUN: 33 mg/dL — ABNORMAL HIGH (ref 8–23)
CO2: 24 mmol/L (ref 22–32)
Calcium: 7.2 mg/dL — ABNORMAL LOW (ref 8.9–10.3)
Chloride: 108 mmol/L (ref 98–111)
Creatinine, Ser: 1.27 mg/dL — ABNORMAL HIGH (ref 0.61–1.24)
GFR, Estimated: 59 mL/min — ABNORMAL LOW (ref >60)
Glucose, Bld: 181 mg/dL — ABNORMAL HIGH (ref 70–99)
Potassium: 4.4 mmol/L (ref 3.5–5.1)
Sodium: 139 mmol/L (ref 135–145)

## 2023-06-21 LAB — PREPARE RBC (CROSSMATCH)

## 2023-06-21 SURGERY — ESOPHAGOGASTRODUODENOSCOPY (EGD)
Anesthesia: General

## 2023-06-21 MED ORDER — OMEGA-3-ACID ETHYL ESTERS 1 G PO CAPS
1.0000 g | ORAL_CAPSULE | Freq: Two times a day (BID) | ORAL | Status: DC
  Administered 2023-06-21 – 2023-06-29 (×16): 1 g via ORAL
  Filled 2023-06-21 (×16): qty 1

## 2023-06-21 MED ORDER — SODIUM CHLORIDE 0.9% IV SOLUTION: Freq: Once | INTRAVENOUS | Status: AC

## 2023-06-21 MED ORDER — ACETAMINOPHEN 325 MG PO TABS: 650.0000 mg | ORAL_TABLET | Freq: Four times a day (QID) | ORAL | Status: DC | PRN

## 2023-06-21 MED ORDER — POLYETHYLENE GLYCOL 3350 17 G PO PACK
17.0000 g | PACK | Freq: Every day | ORAL | Status: DC | PRN
Start: 2023-06-21 — End: 2023-06-29

## 2023-06-21 MED ORDER — SODIUM CHLORIDE 0.9 % IV BOLUS
500.0000 mL | Freq: Once | INTRAVENOUS | Status: AC | PRN
  Administered 2023-06-21: 500 mL via INTRAVENOUS

## 2023-06-21 MED ORDER — ATORVASTATIN CALCIUM 10 MG PO TABS
20.0000 mg | ORAL_TABLET | Freq: Every day | ORAL | Status: DC
  Administered 2023-06-21 – 2023-06-28 (×8): 20 mg via ORAL
  Filled 2023-06-21 (×8): qty 2

## 2023-06-21 MED ORDER — VITAMIN B-12 1000 MCG PO TABS
1000.0000 ug | ORAL_TABLET | Freq: Every day | ORAL | Status: DC
Start: 2023-06-21 — End: 2023-06-29
  Administered 2023-06-21 – 2023-06-28 (×8): 1000 ug via ORAL
  Filled 2023-06-21 (×8): qty 1

## 2023-06-21 MED ORDER — PANTOPRAZOLE SODIUM 40 MG IV SOLR: 40.0000 mg | Freq: Two times a day (BID) | INTRAVENOUS | 0 refills | Status: AC

## 2023-06-21 MED ORDER — MIDAZOLAM HCL 2 MG/2ML IJ SOLN
INTRAMUSCULAR | Status: AC | PRN
  Administered 2023-06-21: .5 mg via INTRAVENOUS

## 2023-06-21 MED ORDER — METOPROLOL SUCCINATE ER 50 MG PO TB24: 100.0000 mg | ORAL_TABLET | Freq: Every morning | ORAL | Status: DC

## 2023-06-21 MED ORDER — TAMSULOSIN HCL 0.4 MG PO CAPS
0.4000 mg | ORAL_CAPSULE | Freq: Every day | ORAL | Status: DC
  Administered 2023-06-22 – 2023-06-29 (×8): 0.4 mg via ORAL
  Filled 2023-06-21 (×8): qty 1

## 2023-06-21 MED ORDER — ONDANSETRON HCL 4 MG/2ML IJ SOLN
4.0000 mg | Freq: Once | INTRAMUSCULAR | Status: AC
  Administered 2023-06-21: 4 mg via INTRAVENOUS
  Filled 2023-06-21: qty 2

## 2023-06-21 MED ORDER — ALPHA LIPOIC ACID 200 MG PO CAPS: ORAL_CAPSULE | Freq: Every morning | ORAL | Status: DC

## 2023-06-21 MED ORDER — VITAMIN C 500 MG PO TABS
500.0000 mg | ORAL_TABLET | Freq: Every day | ORAL | Status: DC
  Administered 2023-06-21 – 2023-06-28 (×8): 500 mg via ORAL
  Filled 2023-06-21 (×8): qty 1

## 2023-06-21 MED ORDER — FOLIC ACID 1 MG PO TABS
2.0000 mg | ORAL_TABLET | Freq: Every morning | ORAL | Status: DC
  Administered 2023-06-23 – 2023-06-29 (×6): 2 mg via ORAL
  Filled 2023-06-21 (×6): qty 2

## 2023-06-21 MED ORDER — OXYBUTYNIN CHLORIDE 5 MG PO TABS
5.0000 mg | ORAL_TABLET | Freq: Three times a day (TID) | ORAL | Status: DC | PRN
  Administered 2023-06-23 – 2023-06-29 (×3): 5 mg via ORAL
  Filled 2023-06-21 (×3): qty 1

## 2023-06-21 MED ORDER — VITAMIN D 25 MCG (1000 UNIT) PO TABS
2000.0000 [IU] | ORAL_TABLET | Freq: Every day | ORAL | Status: DC
  Administered 2023-06-21 – 2023-06-28 (×8): 2000 [IU] via ORAL
  Filled 2023-06-21 (×8): qty 2

## 2023-06-21 MED ORDER — ACETAMINOPHEN 650 MG RE SUPP: 650.0000 mg | Freq: Four times a day (QID) | RECTAL | Status: DC | PRN

## 2023-06-21 MED ORDER — FENTANYL CITRATE (PF) 100 MCG/2ML IJ SOLN
INTRAMUSCULAR | Status: AC | PRN
  Administered 2023-06-21: 25 ug via INTRAVENOUS

## 2023-06-21 MED ORDER — PANTOPRAZOLE SODIUM 40 MG IV SOLR
40.0000 mg | Freq: Two times a day (BID) | INTRAVENOUS | Status: DC
  Administered 2023-06-21 – 2023-06-29 (×15): 40 mg via INTRAVENOUS
  Filled 2023-06-21 (×16): qty 10

## 2023-06-21 MED ORDER — SODIUM CHLORIDE 0.9% FLUSH
3.0000 mL | Freq: Two times a day (BID) | INTRAVENOUS | Status: DC
  Administered 2023-06-21 – 2023-06-29 (×15): 3 mL via INTRAVENOUS

## 2023-06-21 MED ORDER — METOPROLOL SUCCINATE ER 50 MG PO TB24
100.0000 mg | ORAL_TABLET | Freq: Every day | ORAL | Status: DC
  Administered 2023-06-21 – 2023-06-24 (×5): 100 mg via ORAL
  Filled 2023-06-21 (×5): qty 2

## 2023-06-21 MED ORDER — GABAPENTIN 300 MG PO CAPS
300.0000 mg | ORAL_CAPSULE | Freq: Two times a day (BID) | ORAL | Status: DC
  Administered 2023-06-21 – 2023-06-29 (×16): 300 mg via ORAL
  Filled 2023-06-21 (×16): qty 1

## 2023-06-21 NOTE — Discharge Summary (Signed)
Physician Discharge Summary   Patient: Harry Patterson. MRN: [id number] DOB: [birth year]  Admit date:     06/16/2023  Discharge date: 06/21/23  Discharge Physician: Enedina Finner   PCP: Lynnea Ferrier, MD   Recommendations at discharge:    F/u PCP once released from Hospital  Discharge Diagnoses: Principal Problem:   Upper GI bleed Active Problems:   Symptomatic anemia   Suprapubic tenderness   Essential hypertension   Polyneuropathy   Alcohol use   Pure hypercholesterolemia   Benign prostatic hyperplasia without lower urinary tract symptoms   B12 deficiency   Erectile dysfunction   Barrett's esophagus without dysplasia   LBBB (left bundle branch block)  Desmine Saturno is a 77 year old male with history of hypertension, hyperlipidemia, GERD, history of CKD stage IIIb, neuropathy, history of stricture of bulbous urethra who presents emergency department from [facility] clinic for chief concerns of low blood pressure and dizziness.  Patient reports of bleeding per rectum. Gastroenterologist following.  Upper GI bleed Symptomatic anemia Patient is s/p ~9 units of blood transfusion   --- EGD that detected Barrett's esophagus with no obvious signs of bleeding noted --Continue with IV Protonix ----colonoscopy on 06/18/2023- Preparation of the colon was fair.                        - Non-bleeding internal hemorrhoids.                        - Diverticulosis in the sigmoid colon.                        - Blood in the sigmoid colon, in the transverse colon,                         in the ascending colon and in the cecum.                        - Blood in the ascending colon.                        - Blood in the terminal ileum.                        - The examination was otherwise normal.                        - Lavage resulting in clearance with fair                         visualization.                        - No specimens collected.                        - The  exam was suboptimal due to bleeding.                        - Presumed site of bleeding was the right colon,                         though no lesions could be identified, mucosa appeared  normal. ?Right diverticular bleed. Cannot rule out the                         possibility of small bowel source.  --capsule endoscopy on 06/19/2023 showing findings of active bleeding --Interventional radiologist recommended CTA with GI protocol that did not show any active bleeding done on 06/19/2023.  --11/21--Repeat CT angio1. Active small-bowel bleed in the upper abdomen. 2. Endoscopy capsule in the mid descending colon. 3. Mild sigmoid diverticulosis. No bowel obstruction.  --IR was consulted with above results--access via the RIGHT femoral artery - variant splanchnic anatomy w a replaced CHA from the SMA - No active extravasation from SMA and distal branch interrogation - No embolization was performed. - AngioSeal closure at the R groin with distal RLE pulses at the end of the case.   --case d/w [facility] and [facility]--declined transfer due to capacity --spoke with Dr Vella Kohler at [facility] and reviewed studies with Dr Norma Fredrickson. OK to transfer to give a try for Single lumen enteroscopy --Currently on sips with ice chips  Acute kidney injury --Creatinine have worsened from 1.0-1.36 today --Avoid nephrotoxic agents   Suprapubic tenderness --Patient had cystoscopy with balloon urethral dilatation on 11/5 --UA with no concern of UTI.   Essential hypertension --Continue home dose of metoprolol --Continue hydralazine 5 mg IV every 6 hours as needed for SBP greater 175, 7 days ordered --holding Losartan, chlorthalidone, spironolactone for now--resume when appropriate  Polyneuropathy --Continue gabapentin   Alcohol use --Counseled on cessation --no s/o WD   Pure hypercholesterolemia Continue Home atorvastatin 20 mg nightly resumed   Benign prostatic hyperplasia without lower  urinary tract symptoms --Home Flomax resumed for 11/17 --Continue oxybutynin 5 mg every 8 hours as needed bladder spasms   B12 deficiency --Continue home B12 supplement       Consultants: GI Dr Norma Fredrickson Procedures performed: as above  Disposition:  [facility] DISCHARGE MEDICATION: Allergies as of 06/21/2023       Reactions   Amoxicillin Other (See Comments)   Oral thrush   Other Swelling   Pneumonia vaccine-swollen/painful/fever chills   Amlodipine Other (See Comments)   EDEMA        Medication List     STOP taking these medications    aspirin EC 81 MG tablet   celecoxib 200 MG capsule Commonly known as: CELEBREX   chlorthalidone 25 MG tablet Commonly known as: HYGROTON   diphenoxylate-atropine 2.5-0.025 MG tablet Commonly known as: LOMOTIL   losartan 100 MG tablet Commonly known as: COZAAR   pantoprazole 40 MG tablet Commonly known as: PROTONIX Replaced by: pantoprazole 40 MG injection   spironolactone 100 MG tablet Commonly known as: ALDACTONE       TAKE these medications    acetaminophen 500 MG tablet Commonly known as: TYLENOL Take 1,000 mg by mouth in the morning and at bedtime.   ALPHA LIPOIC ACID PO Take 1 capsule by mouth in the morning.   ascorbic acid 500 MG tablet Commonly known as: VITAMIN C Take 500 mg by mouth at bedtime.   atorvastatin 20 MG tablet Commonly known as: LIPITOR Take 20 mg by mouth at bedtime.   Cholecalciferol 25 MCG (1000 UT) capsule Take 2,000 Units by mouth at bedtime.   cyanocobalamin 1000 MCG tablet Commonly known as: VITAMIN B12 Take 1,000 mcg by mouth at bedtime.   folic acid 1 MG tablet Commonly known as: FOLVITE Take 2 mg by mouth in the morning.   gabapentin 300  MG capsule Commonly known as: NEURONTIN Take 300 mg by mouth in the morning and at bedtime.   metoprolol succinate 100 MG 24 hr tablet Commonly known as: TOPROL-XL Take 100 mg by mouth in the morning.   omega-3 acid ethyl esters 1  g capsule Commonly known as: LOVAZA Take 1 g by mouth in the morning and at bedtime.   oxybutynin 5 MG tablet Commonly known as: DITROPAN 1 tab tid prn frequency,urgency, bladder spasm   pantoprazole 40 MG injection Commonly known as: PROTONIX Inject 40 mg into the vein every 12 (twelve) hours. Replaces: pantoprazole 40 MG tablet   tamsulosin 0.4 MG Caps capsule Commonly known as: FLOMAX Take 1 capsule (0.4 mg total) by mouth daily.   triamcinolone ointment 0.1 % Commonly known as: KENALOG Apply 1 Application topically 2 (two) times daily as needed (leg irritation/rash.).        Follow-up Information     Lynnea Ferrier, MD. Schedule an appointment as soon as possible for a visit.   Specialty: Internal Medicine Contact information: [address] [facility] [place] [address] [phone]                Discharge Exam: Ceasar Mons Weights   06/16/23 1220  Weight: 86.2 kg   A an Ox3 Pallor Resp CTA CV tachycardia Abd soft nontender  Condition at discharge: fair  The results of significant diagnostics from this hospitalization (including imaging, microbiology, ancillary and laboratory) are listed below for reference.   Imaging Studies: IR Angiogram Visceral Selective  Result Date: 06/21/2023 INDICATION: small bowel bleed UGIB. Active extravasation from mid small bowel on CTA EXAM: Procedure: MESENTERIC ARTERIOGRAPHY Including SUPERIOR MESENTERIC ARTERY, JEJUNAL ARTERIAL BRANCHES INTERROGATION COMPARISON:  CT AP, 06/20/2023 and 06/19/2023. MEDICATIONS: None ANESTHESIA/SEDATION: Moderate (conscious) sedation was employed during this procedure. A total of Versed 0.5 mg and Fentanyl 25 mcg was administered intravenously. Moderate Sedation Time: 47 minutes. The patient's level of consciousness and vital signs were monitored continuously by radiology nursing throughout the procedure under my direct supervision. CONTRAST:  105 mL Omnipaque 300  FLUOROSCOPY TIME:  Fluoroscopic dose; 311 mGy COMPLICATIONS: None immediate. PROCEDURE: Informed consent was obtained from the patient and/or patient's representative following explanation of the procedure, risks, benefits and alternatives. All questions were addressed. A time out was performed prior to the initiation of the procedure. Maximal barrier sterile technique utilized including caps, mask, sterile gowns, sterile gloves, large sterile drape, hand hygiene, and sterile prep. The RIGHT femoral head was marked fluoroscopically. Under sterile conditions and local anesthesia, the RIGHT common femoral artery access was performed with a micropuncture needle. Under direct ultrasound guidance, the RIGHT common femoral was accessed with a micropuncture kit. An ultrasound image was saved for documentation purposes. A limited arteriogram was performed through the side arm of the sheath confirming appropriate access within the RIGHT femoral artery. This allowed for placement of a 5 Fr vascular sheath. Over a Bentson wire, a 5 Fr cobra 2 catheter was used to select the superior mesenteric artery. Selective mesenteric angiograms were performed. Despite extensive mesenteric territory vessel interrogation, no active extravasation was identified. Embolization was NOT performed. Images were reviewed and the procedure was terminated. All wires, catheters and sheaths were removed from the patient. Hemostasis was achieved at the RIGHT groin access site with Angio-Seal closure. The patient remained hemodynamically stable during the procedure, and was without immediate post procedural complication. FINDINGS: *Variant splanchnic arterial anatomy with a replaced common hepatic artery originating from the SMA. *  Interrogation of the SMA and distal (jejunal) arterial branches, within the area of noted extravasation on CTA. *No active extravasation was observed. Embolization was NOT performed. IMPRESSION: Mesenteric arteriography in  interrogation for upper GI bleed. No active extravasation was observed on catheter angiography. Embolization was NOT performed. PLAN: - The patient is to remain flat for 2 hours with RIGHT leg straight. - if there remains clinical concern for persistent upper GI bleeding, would recommend coordinating with the Vascular and interventional Radiology (VIR) service to perform a CTA abdomen so as to expedite potential repeat angiography if the study is positive. Roanna Banning, MD [facility] Radiology Electronically Signed   By: Roanna Banning M.D.   On: 06/21/2023 07:07   CT Angio Abd/Pel w/ and/or w/o  Result Date: 06/20/2023 CLINICAL DATA:  Lower GI bleed. EXAM: CTA ABDOMEN AND PELVIS WITHOUT AND WITH CONTRAST TECHNIQUE: Multidetector CT imaging of the abdomen and pelvis was performed using the standard protocol during bolus administration of intravenous contrast. Multiplanar reconstructed images and MIPs were obtained and reviewed to evaluate the vascular anatomy. RADIATION DOSE REDUCTION: This exam was performed according to the departmental dose-optimization program which includes automated exposure control, adjustment of the mA and/or kV according to patient size and/or use of iterative reconstruction technique. CONTRAST:  OMNIPAQUE IOHEXOL 350 MG/ML SOLN COMPARISON:  CT dated 06/19/2023. FINDINGS: VASCULAR Aorta: Mild atherosclerotic calcification. No aneurysmal dilatation or dissection. No periaortic fluid collection. Celiac: The celiac trunk and its major branches are patent. SMA: Atherosclerotic calcification of the origin of the SMA. The SMA is patent. The common hepatic artery arises from the SMA. Renals: The renal arteries are patent. IMA: The IMA is patent. Inflow: Mild atherosclerotic calcification. The iliac arteries are patent. No aneurysmal dilatation or dissection. Proximal Outflow: The visualized proximal outflow is patent. Veins: The IVC is  unremarkable. The SMV, splenic vein, and main portal vein are patent. No portal venous gas. Review of the MIP images confirms the above findings. NON-VASCULAR Lower chest: The visualized lung bases are clear. There is coronary vascular calcification. No intra-abdominal free air or free fluid. Hepatobiliary: The liver is unremarkable. No biliary dilatation. High attenuating content within the gallbladder, likely vicariously excreted contrast from recent study. Pancreas: Unremarkable. No pancreatic ductal dilatation or surrounding inflammatory changes. Spleen: Normal in size without focal abnormality. Adrenals/Urinary Tract: The adrenal glands are unremarkable. There is no hydronephrosis or nephrolithiasis on either side. Bilateral renal cysts. The visualized ureters and urinary bladder appear unremarkable. Stomach/Bowel: Endoscopy capsule with associated streak artifact in the mid descending colon. There is mild sigmoid diverticulosis without active inflammatory changes. Evaluation for GI bleed is limited due to presence of high attenuating content in the colon. There is however pooling of contrast in a small bowel loop in the upper abdomen to the right of the midline (images 33-39 on axial series 15) consistent with active small-bowel bleed. There is no bowel obstruction. Appendectomy. Lymphatic: No adenopathy. Reproductive: The prostate and seminal vesicles are grossly unremarkable. Other: Small fat containing left inguinal hernia. No bowel herniation or fluid collection. Musculoskeletal: Degenerative changes of the spine. Bilateral femoral head avascular necrosis, right greater than left. No acute osseous pathology. IMPRESSION: 1. Active small-bowel bleed in the upper abdomen. 2. Endoscopy capsule in the mid descending colon. 3. Mild sigmoid diverticulosis. No bowel obstruction. Electronically Signed   By: Elgie Collard M.D.   On: 06/20/2023 21:36   CT Angio Abd/Pel w/ and/or w/o  Result Date:  06/19/2023 CLINICAL  DATA:  Lower GI bleed EXAM: CTA ABDOMEN AND PELVIS WITHOUT AND WITH CONTRAST TECHNIQUE: Multidetector CT imaging of the abdomen and pelvis was performed using the standard protocol during bolus administration of intravenous contrast. Multiplanar reconstructed images and MIPs were obtained and reviewed to evaluate the vascular anatomy. RADIATION DOSE REDUCTION: This exam was performed according to the departmental dose-optimization program which includes automated exposure control, adjustment of the mA and/or kV according to patient size and/or use of iterative reconstruction technique. CONTRAST:  OMNIPAQUE IOHEXOL 350 MG/ML SOLN COMPARISON:  None Available. FINDINGS: VASCULAR Aorta: Normal caliber aorta without aneurysm, dissection, vasculitis or significant stenosis. Mild scattered atherosclerotic plaque. Celiac: Patent without evidence of aneurysm, dissection, vasculitis or significant stenosis. The common hepatic artery is replaced to the SMA. SMA: Patent without evidence of aneurysm, dissection, vasculitis or significant stenosis. Replaced common hepatic artery. Renals: Both renal arteries are patent without evidence of aneurysm, dissection, vasculitis, fibromuscular dysplasia or significant stenosis. IMA: Patent without evidence of aneurysm, dissection, vasculitis or significant stenosis. Inflow: Patent without evidence of aneurysm, dissection, vasculitis or significant stenosis. Proximal Outflow: Bilateral common femoral and visualized portions of the superficial and profunda femoral arteries are patent without evidence of aneurysm, dissection, vasculitis or significant stenosis. Veins: No focal venous abnormality. Review of the MIP images confirms the above findings. NON-VASCULAR Lower chest: The intracardiac blood pool is hypodense relative to the adjacent myocardium consistent with anemia. Hepatobiliary: No focal liver abnormality is seen. No gallstones, gallbladder wall  thickening, or biliary dilatation. Pancreas: Unremarkable. No pancreatic ductal dilatation or surrounding inflammatory changes. Spleen: Normal in size without focal abnormality. Adrenals/Urinary Tract: Normal adrenal glands. No hydronephrosis, nephrolithiasis or enhancing renal mass. Multiple water attenuation renal lesions bilaterally consistent with simple cysts. The ureters and bladder are unremarkable. Stomach/Bowel: No evidence of extravasation of contrast material into the stomach, large bowel or small bowel. An endoscopic capsule is present within the cecum. No focal bowel wall thickening or evidence of obstruction. Lymphatic: No suspicious lymphadenopathy. Reproductive: Prostate is unremarkable. Other: Fat containing left inguinal hernia. Musculoskeletal: No acute fracture or aggressive appearing lytic or blastic osseous lesion. IMPRESSION: 1. Negative for active GI bleed at this time. 2. The intracardiac blood pool is hypodense relative to the adjacent myocardium consistent with anemia. 3. Endoscopic capsule present within the cecum. 4. No focal bowel wall thickening, significant diverticulosis or evidence of obstruction. 5. Omental fat containing left inguinal hernia. 6. Common hepatic artery is replaced to the SMA. Aortic Atherosclerosis (ICD10-I70.0). Signed, Sterling Big, MD, RPVI [facility] Radiology Electronically Signed   By: Malachy Moan M.D.   On: 06/19/2023 16:02   DG Chest Port 1 View  Result Date: 06/16/2023 CLINICAL DATA:  Dyspnea on exertion EXAM: PORTABLE CHEST 1 VIEW COMPARISON:  02/09/2017 FINDINGS: Single frontal view of the chest demonstrates an unremarkable cardiac silhouette. No airspace disease, effusion, or pneumothorax. No acute bony abnormalities. IMPRESSION: 1. No acute intrathoracic process. Electronically Signed   By: Sharlet Salina M.D.   On: 06/16/2023 17:57    Microbiology: Results for orders placed or  performed in visit on 05/25/23  Microscopic Examination     Status: None   Collection Time: 05/25/23  8:11 AM   Urine  Result Value Ref Range Status   WBC, UA 0-5 0 - 5 /hpf Final   RBC, Urine None seen 0 - 2 /hpf Final   Epithelial Cells (non renal) 0-10 0 - 10 /hpf Final   Bacteria, UA None seen  None seen/Few Final    Labs: CBC: Recent Labs  Lab 06/19/23 0445 06/19/23 1147 06/20/23 0631 06/20/23 1646 06/20/23 2238 06/21/23 0255 06/21/23 0922  WBC 9.9  --  11.6* 10.8* 13.5* 14.4*  --   NEUTROABS 6.9  --  7.7 7.7 9.7* 11.1*  --   HGB 6.0*   < > 6.7* 6.1* 7.4* 6.9* 8.0*  HCT 17.6*   < > 19.6* 18.0* 21.8* 20.3* 23.4*  MCV 96.2  --  95.1 91.4 90.8 93.1  --   PLT 144*  --  144* 137* 155 148*  --    < > = values in this interval not displayed.   Basic Metabolic Panel: Recent Labs  Lab 06/18/23 0416 06/19/23 0445 06/20/23 0631 06/20/23 2238 06/21/23 0255  NA 141 139 140 137 139  K 3.9 3.8 4.1 4.2 4.4  CL 111 106 110 107 108  CO2 23 25 26 23 24   GLUCOSE 150* 144* 157* 168* 181*  BUN 48* 29* 34* 31* 33*  CREATININE 1.31* 1.05 1.36* 1.14 1.27*  CALCIUM 7.9* 7.7* 7.7* 7.0* 7.2*  MG  --   --   --  2.1  --    Liver Function Tests: Recent Labs  Lab 06/20/23 2238  AST 13*  ALT 10  ALKPHOS 29*  BILITOT 0.8  PROT 4.1*  ALBUMIN 2.3*   CBG: Recent Labs  Lab 06/17/23 0959  GLUCAP 145*    Discharge time spent: greater than 30 minutes.  Signed: Enedina Finner, MD [facility] 06/21/2023

## 2023-06-21 NOTE — Significant Event (Signed)
Rapid Response Event Note   Reason for Call : SOB, tachycardia   Initial Focused Assessment:  I was notified for SOB and tachycardia. Pt with known GIB. C/o SOB and abdominal cramping. Passing flatus and some bloody stools. CBC pending. Pt is pale and weak. NS bolus started 500cc.   2036-98.16F, HR 137 afib, 122/71 (81), RR 20 sats 96% RA    Interventions:  -NS bolus 500cc     Addendum - 2200- Large bloody BM with clots reported. HR 119, 136/61 (82), RR 21 sats 96 RA Hgb 6.3/20. 1 unit PRBCs ordered. Will need additional units if bleeding continues.     MD Notified: Dr. Loney Loh per primary RN Call Time: 2033 Arrival Time: 2040 End Time: 2100  Rose Fillers, RN

## 2023-06-21 NOTE — Plan of Care (Signed)
  Problem: Education: Goal: Knowledge of General Education information will improve Description: Including pain rating scale, medication(s)/side effects and non-pharmacologic comfort measures Outcome: Progressing   Problem: Health Behavior/Discharge Planning: Goal: Ability to manage health-related needs will improve Outcome: Progressing   Problem: Clinical Measurements: Goal: Will remain free from infection Outcome: Progressing   Problem: Elimination: Goal: Will not experience complications related to urinary retention Outcome: Progressing   Problem: Safety: Goal: Ability to remain free from injury will improve Outcome: Progressing

## 2023-06-21 NOTE — Procedures (Signed)
Vascular and Interventional Radiology Procedure Note  Patient: Harry Patterson. DOB: November 11, 1945 Medical Record Number: [id number] Note Date/Time: 06/21/23 12:03 AM   Performing Physician: Roanna Banning, MD Assistant(s): None  Diagnosis: UGIB. Active extravasation from mid small bowel on CTA   Procedure:  MESENTERIC ARTERIOGRAPHY, including SUPERIOR MESENTERIC ARTERY, JEJUNAL BRANCH INTERROGATION   Anesthesia: Conscious Sedation Complications: None Estimated Blood Loss: Minimal Specimens: None   Findings:  - access via the RIGHT femoral artery - variant splanchnic anatomy w a replaced CHA from the SMA - No active extravasation from SMA and distal branch interrogation - No embolization was performed. - AngioSeal closure at the R groin with distal RLE pulses at the end of the case.   Plan: - Post sheath removal precautions.  - Bedrest with RLE straight x2hrs.   Final report to follow once all images are reviewed and compared with previous studies.  See detailed dictation with images in PACS. The patient tolerated the procedure well without incident or complication and was returned to Recovery in stable condition.    Roanna Banning, MD [facility] Radiology   Pager. [phone] Clinic. [phone]

## 2023-06-21 NOTE — Sedation Documentation (Signed)
Dr. Milford Cage aware of pt's current cardiac rhythm as well as comparison to EKG earlier from a couple hours ago

## 2023-06-21 NOTE — Progress Notes (Signed)
Called [facility] and gave report to Friedens, Charity fundraiser. Patient will go to bed 33.

## 2023-06-21 NOTE — H&P (Signed)
History and Physical   Harry Patterson. WNU:[id number] DOB: 1946/03/01 DOA: 06/21/2023  PCP: Lynnea Ferrier, MD   Patient coming from: [facility]  Chief Complaint: GI bleed  HPI and course: Gretchen Watwood. is a 77 y.o. male with medical history significant of hypertension, neuropathy, hyperlipidemia, alcohol use, B12 deficiency, Barrett's esophagus, BPH, chronic prostatitis presenting with ongoing GI bleed despite interventions at [facility].  Patient initially presented to [facility] on 11/16 from [place] clinic.  Patient presented with dizziness, low blood pressure, bright red blood per rectum.  Found to have hemoglobin 7.5 down from baseline of 12.  Workup at [facility] included EGD with Barrett's esophagus but no source of bleeding.  Colonoscopy with hemorrhoids and diverticulosis with no source of bleeding but blood noted in the colon.  Capsule endoscopy without obvious source of bleeding.  CTA showing small upper bleed.  IR intervention but no obvious extravasation so no embolization formed.  Recommended for advanced endoscopy but declined by [facility] or [facility] due to capacity.  Accepted by [facility] here for trial of single lumen enteroscopy.  Patient has required 1 to 2 units transfusion per day while at [facility].  Total of 9 units transfused.  Vital signs have remained relatively stable with blood pressure in the 130s to 150s systolic and heart rate in the 100s.  Mild increase in creatinine now 1.3 from baseline of around 1.  Mildly increased leukocytosis to 14.4, possibly reactive.  Review of Systems: As per HPI otherwise all other systems reviewed and are negative.  Past Medical History:  Diagnosis Date   Adenomatous colon polyp    Allergic rhinitis    Arthritis    Avascular necrosis of bones of both hips (HCC)    B12 deficiency    Barrett's esophagus without dysplasia    Bilateral inguinal hernia    Bilateral renal cysts    CAD (coronary artery disease)    Chronic prostatitis    DDD  (degenerative disc disease), lumbar    Enlarged prostate    Erectile dysfunction    a.) on PDE5i (sildenafil)   GERD (gastroesophageal reflux disease)    History of bilateral cataract extraction 2020   HLD (hyperlipidemia)    HTN (hypertension)    Hypertension    LBBB (left bundle branch block)    Leukopenia    Long term current use of aspirin    OSA on CPAP    Peripheral neuropathy    Prediabetes    Skin cancer    Substance abuse (HCC)    Vitamin D deficiency     Past Surgical History:  Procedure Laterality Date   APPENDECTOMY     BIOPSY  01/16/2023   Procedure: BIOPSY;  Surgeon: Regis Bill, MD;  Location: [facility] ENDOSCOPY;  Service: Endoscopy;;   CATARACT EXTRACTION W/PHACO Right 06/11/2019   Procedure: CATARACT EXTRACTION PHACO AND INTRAOCULAR LENS PLACEMENT (IOC) RIGHT 00:54.6     22.0%    12.00;  Surgeon: Lockie Mola, MD;  Location: [facility];  Service: Ophthalmology;  Laterality: Right;  sleep apnea   CATARACT EXTRACTION W/PHACO Left 06/30/2019   Procedure: CATARACT EXTRACTION PHACO AND INTRAOCULAR LENS PLACEMENT (IOC) LEFT TORIC LENS 9.97  01:09.1  14.4%;  Surgeon: Lockie Mola, MD;  Location: [facility]ERY [facility];  Service: Ophthalmology;  Laterality: Left;   COLONOSCOPY N/A 06/18/2023   Procedure: COLONOSCOPY;  Surgeon: Toledo, Boykin Nearing, MD;  Location: [facility] ENDOSCOPY;  Service: Gastroenterology;  Laterality: N/A;   COLONOSCOPY W/ POLYPECTOMY  02/08/00,11'12'04,05/05/05, 08/21/2008, 12/05/2013, 05/08/2014   COLONOSCOPY WITH PROPOFOL N/A 06/06/2017   Procedure: COLONOSCOPY WITH PROPOFOL;  Surgeon: Scot Jun, MD;  Location: [facility] ENDOSCOPY;  Service: Endoscopy;  Laterality: N/A;   COLONOSCOPY WITH PROPOFOL N/A 01/16/2023   Procedure: COLONOSCOPY WITH PROPOFOL;  Surgeon: Regis Bill, MD;  Location: [facility] ENDOSCOPY;  Service: Endoscopy;  Laterality: N/A;   cyst removed  Right    thumb   CYSTOSCOPY WITH URETHRAL DILATATION N/A  06/05/2023   Procedure: CYSTOSCOPY WITH URETHRAL BALLOON DILATATION USING OPTILUME;  Surgeon: Riki Altes, MD;  Location: [facility] ORS;  Service: Urology;  Laterality: N/A;   ESOPHAGOGASTRODUODENOSCOPY     x7   ESOPHAGOGASTRODUODENOSCOPY N/A 06/17/2023   Procedure: ESOPHAGOGASTRODUODENOSCOPY (EGD);  Surgeon: Toledo, Boykin Nearing, MD;  Location: [facility] ENDOSCOPY;  Service: Gastroenterology;  Laterality: N/A;   ESOPHAGOGASTRODUODENOSCOPY (EGD) WITH PROPOFOL N/A 06/06/2017   Procedure: ESOPHAGOGASTRODUODENOSCOPY (EGD) WITH PROPOFOL;  Surgeon: Scot Jun, MD;  Location: [facility] ENDOSCOPY;  Service: Endoscopy;  Laterality: N/A;   ESOPHAGOGASTRODUODENOSCOPY (EGD) WITH PROPOFOL N/A 11/27/2019   Procedure: ESOPHAGOGASTRODUODENOSCOPY (EGD) WITH PROPOFOL;  Surgeon: Toledo, Boykin Nearing, MD;  Location: [facility] ENDOSCOPY;  Service: Gastroenterology;  Laterality: N/A;   ESOPHAGOGASTRODUODENOSCOPY (EGD) WITH PROPOFOL N/A 01/16/2023   Procedure: ESOPHAGOGASTRODUODENOSCOPY (EGD) WITH PROPOFOL;  Surgeon: Regis Bill, MD;  Location: [facility] ENDOSCOPY;  Service: Endoscopy;  Laterality: N/A;   HEMORRHOID SURGERY     IR ANGIOGRAM VISCERAL SELECTIVE  06/21/2023   TONSILLECTOMY      Social History  reports that he has never smoked. He has never used smokeless tobacco. He reports current alcohol use of about 2.0 standard drinks of alcohol per week. He reports that he does not use drugs.  Allergies  Allergen Reactions   Amoxicillin Other (See Comments)    Oral thrush   Other Swelling    Pneumonia vaccine-swollen/painful/fever chills   Amlodipine Other (See Comments)    EDEMA    Family History  Problem Relation Age of Onset   Heart disease Mother   Reviewed on admission  Prior to Admission medications   Medication Sig Start Date End Date Taking? Authorizing Provider  acetaminophen (TYLENOL) 500 MG tablet Take 1,000 mg by mouth in the morning and at bedtime.    [provider]  ALPHA LIPOIC ACID PO  Take 1 capsule by mouth in the morning.    [provider]  atorvastatin (LIPITOR) 20 MG tablet Take 20 mg by mouth at bedtime. 04/15/18   [provider]  Cholecalciferol 1000 units capsule Take 2,000 Units by mouth at bedtime.    [provider]  folic acid (FOLVITE) 1 MG tablet Take 2 mg by mouth in the morning.    [provider]  gabapentin (NEURONTIN) 300 MG capsule Take 300 mg by mouth in the morning and at bedtime.    [provider]  metoprolol succinate (TOPROL-XL) 100 MG 24 hr tablet Take 100 mg by mouth in the morning.    [provider]  omega-3 acid ethyl esters (LOVAZA) 1 g capsule Take 1 g by mouth in the morning and at bedtime. 02/02/18   [provider]  oxybutynin (DITROPAN) 5 MG tablet 1 tab tid prn frequency,urgency, bladder spasm 06/05/23   Stoioff, Verna Czech, MD  pantoprazole (PROTONIX) 40 MG injection Inject 40 mg into the vein every 12 (twelve) hours. 06/21/23   Enedina Finner, MD  tamsulosin (FLOMAX) 0.4 MG CAPS capsule Take 1 capsule (0.4 mg total) by mouth daily. 04/26/18  Stoioff, Verna Czech, MD  triamcinolone ointment (KENALOG) 0.1 % Apply 1 Application topically 2 (two) times daily as needed (leg irritation/rash.). 06/05/17   [provider]  vitamin B-12 (CYANOCOBALAMIN) 1000 MCG tablet Take 1,000 mcg by mouth at bedtime.    [provider]  vitamin C (ASCORBIC ACID) 500 MG tablet Take 500 mg by mouth at bedtime.    [provider]    Physical Exam: Vitals:   06/21/23 1732  BP: (!) 145/68  Pulse: (!) 106  SpO2: 93%  Weight: 87.5 kg  Height: 5\' 8"  (1.727 m)    Physical Exam Constitutional:      General: He is not in acute distress.    Appearance: Normal appearance.  HENT:     Head: Normocephalic and atraumatic.     Mouth/Throat:     Mouth: Mucous membranes are moist.     Pharynx: Oropharynx is clear.  Eyes:     Extraocular Movements: Extraocular movements intact.     Pupils:  Pupils are equal, round, and reactive to light.  Cardiovascular:     Rate and Rhythm: Normal rate and regular rhythm.     Pulses: Normal pulses.     Heart sounds: Normal heart sounds.  Pulmonary:     Effort: Pulmonary effort is normal. No respiratory distress.     Breath sounds: Normal breath sounds.  Abdominal:     General: Bowel sounds are normal. There is no distension.     Palpations: Abdomen is soft.     Tenderness: There is no abdominal tenderness.  Musculoskeletal:        General: No swelling or deformity.  Skin:    General: Skin is warm and dry.  Neurological:     General: No focal deficit present.     Mental Status: Mental status is at baseline.    Labs on Admission: I have personally reviewed following labs and imaging studies  CBC: Recent Labs  Lab 06/19/23 0445 06/19/23 1147 06/20/23 0631 06/20/23 1646 06/20/23 2238 06/21/23 0255 06/21/23 0922  WBC 9.9  --  11.6* 10.8* 13.5* 14.4*  --   NEUTROABS 6.9  --  7.7 7.7 9.7* 11.1*  --   HGB 6.0*   < > 6.7* 6.1* 7.4* 6.9* 8.0*  HCT 17.6*   < > 19.6* 18.0* 21.8* 20.3* 23.4*  MCV 96.2  --  95.1 91.4 90.8 93.1  --   PLT 144*  --  144* 137* 155 148*  --    < > = values in this interval not displayed.    Basic Metabolic Panel: Recent Labs  Lab 06/18/23 0416 06/19/23 0445 06/20/23 0631 06/20/23 2238 06/21/23 0255  NA 141 139 140 137 139  K 3.9 3.8 4.1 4.2 4.4  CL 111 106 110 107 108  CO2 23 25 26 23 24   GLUCOSE 150* 144* 157* 168* 181*  BUN 48* 29* 34* 31* 33*  CREATININE 1.31* 1.05 1.36* 1.14 1.27*  CALCIUM 7.9* 7.7* 7.7* 7.0* 7.2*  MG  --   --   --  2.1  --     GFR: Estimated Creatinine Clearance: 53.2 mL/min (A) (by C-G formula based on SCr of 1.27 mg/dL (H)).  Liver Function Tests: Recent Labs  Lab 06/20/23 2238  AST 13*  ALT 10  ALKPHOS 29*  BILITOT 0.8  PROT 4.1*  ALBUMIN 2.3*    Urine analysis:    Component Value Date/Time   COLORURINE STRAW (A) 06/16/2023 1845   APPEARANCEUR CLEAR  (A) 06/16/2023  1845   APPEARANCEUR Clear 05/25/2023 0811   LABSPEC 1.014 06/16/2023 1845   PHURINE 5.0 06/16/2023 1845   GLUCOSEU NEGATIVE 06/16/2023 1845   HGBUR NEGATIVE 06/16/2023 1845   BILIRUBINUR NEGATIVE 06/16/2023 1845   BILIRUBINUR Negative 05/25/2023 0811   KETONESUR NEGATIVE 06/16/2023 1845   PROTEINUR NEGATIVE 06/16/2023 1845   NITRITE NEGATIVE 06/16/2023 1845   LEUKOCYTESUR NEGATIVE 06/16/2023 1845    Radiological Exams on Admission: IR Angiogram Visceral Selective  Result Date: 06/21/2023 INDICATION: small bowel bleed UGIB. Active extravasation from mid small bowel on CTA EXAM: Procedure: MESENTERIC ARTERIOGRAPHY Including SUPERIOR MESENTERIC ARTERY, JEJUNAL ARTERIAL BRANCHES INTERROGATION COMPARISON:  CT AP, 06/20/2023 and 06/19/2023. MEDICATIONS: None ANESTHESIA/SEDATION: Moderate (conscious) sedation was employed during this procedure. A total of Versed 0.5 mg and Fentanyl 25 mcg was administered intravenously. Moderate Sedation Time: 47 minutes. The patient's level of consciousness and vital signs were monitored continuously by radiology nursing throughout the procedure under my direct supervision. CONTRAST:  105 mL Omnipaque 300 FLUOROSCOPY TIME:  Fluoroscopic dose; 311 mGy COMPLICATIONS: None immediate. PROCEDURE: Informed consent was obtained from the patient and/or patient's representative following explanation of the procedure, risks, benefits and alternatives. All questions were addressed. A time out was performed prior to the initiation of the procedure. Maximal barrier sterile technique utilized including caps, mask, sterile gowns, sterile gloves, large sterile drape, hand hygiene, and sterile prep. The RIGHT femoral head was marked fluoroscopically. Under sterile conditions and local anesthesia, the RIGHT common femoral artery access was performed with a micropuncture needle. Under direct ultrasound guidance, the RIGHT common femoral was accessed with a micropuncture kit.  An ultrasound image was saved for documentation purposes. A limited arteriogram was performed through the side arm of the sheath confirming appropriate access within the RIGHT femoral artery. This allowed for placement of a 5 Fr vascular sheath. Over a Bentson wire, a 5 Fr cobra 2 catheter was used to select the superior mesenteric artery. Selective mesenteric angiograms were performed. Despite extensive mesenteric territory vessel interrogation, no active extravasation was identified. Embolization was NOT performed. Images were reviewed and the procedure was terminated. All wires, catheters and sheaths were removed from the patient. Hemostasis was achieved at the RIGHT groin access site with Angio-Seal closure. The patient remained hemodynamically stable during the procedure, and was without immediate post procedural complication. FINDINGS: *Variant splanchnic arterial anatomy with a replaced common hepatic artery originating from the SMA. *Interrogation of the SMA and distal (jejunal) arterial branches, within the area of noted extravasation on CTA. *No active extravasation was observed. Embolization was NOT performed. IMPRESSION: Mesenteric arteriography in interrogation for upper GI bleed. No active extravasation was observed on catheter angiography. Embolization was NOT performed. PLAN: - The patient is to remain flat for 2 hours with RIGHT leg straight. - if there remains clinical concern for persistent upper GI bleeding, would recommend coordinating with the Vascular and interventional Radiology (VIR) service to perform a CTA abdomen so as to expedite potential repeat angiography if the study is positive. Roanna Banning, MD [facility] Radiology Electronically Signed   By: Roanna Banning M.D.   On: 06/21/2023 07:07   CT Angio Abd/Pel w/ and/or w/o  Result Date: 06/20/2023 CLINICAL DATA:  Lower GI bleed. EXAM: CTA ABDOMEN AND PELVIS WITHOUT AND WITH CONTRAST  TECHNIQUE: Multidetector CT imaging of the abdomen and pelvis was performed using the standard protocol during bolus administration of intravenous contrast. Multiplanar reconstructed images and MIPs were obtained and reviewed to evaluate the vascular anatomy.  RADIATION DOSE REDUCTION: This exam was performed according to the departmental dose-optimization program which includes automated exposure control, adjustment of the mA and/or kV according to patient size and/or use of iterative reconstruction technique. CONTRAST:  OMNIPAQUE IOHEXOL 350 MG/ML SOLN COMPARISON:  CT dated 06/19/2023. FINDINGS: VASCULAR Aorta: Mild atherosclerotic calcification. No aneurysmal dilatation or dissection. No periaortic fluid collection. Celiac: The celiac trunk and its major branches are patent. SMA: Atherosclerotic calcification of the origin of the SMA. The SMA is patent. The common hepatic artery arises from the SMA. Renals: The renal arteries are patent. IMA: The IMA is patent. Inflow: Mild atherosclerotic calcification. The iliac arteries are patent. No aneurysmal dilatation or dissection. Proximal Outflow: The visualized proximal outflow is patent. Veins: The IVC is unremarkable. The SMV, splenic vein, and main portal vein are patent. No portal venous gas. Review of the MIP images confirms the above findings. NON-VASCULAR Lower chest: The visualized lung bases are clear. There is coronary vascular calcification. No intra-abdominal free air or free fluid. Hepatobiliary: The liver is unremarkable. No biliary dilatation. High attenuating content within the gallbladder, likely vicariously excreted contrast from recent study. Pancreas: Unremarkable. No pancreatic ductal dilatation or surrounding inflammatory changes. Spleen: Normal in size without focal abnormality. Adrenals/Urinary Tract: The adrenal glands are unremarkable. There is no hydronephrosis or nephrolithiasis on either side. Bilateral renal cysts. The visualized  ureters and urinary bladder appear unremarkable. Stomach/Bowel: Endoscopy capsule with associated streak artifact in the mid descending colon. There is mild sigmoid diverticulosis without active inflammatory changes. Evaluation for GI bleed is limited due to presence of high attenuating content in the colon. There is however pooling of contrast in a small bowel loop in the upper abdomen to the right of the midline (images 33-39 on axial series 15) consistent with active small-bowel bleed. There is no bowel obstruction. Appendectomy. Lymphatic: No adenopathy. Reproductive: The prostate and seminal vesicles are grossly unremarkable. Other: Small fat containing left inguinal hernia. No bowel herniation or fluid collection. Musculoskeletal: Degenerative changes of the spine. Bilateral femoral head avascular necrosis, right greater than left. No acute osseous pathology. IMPRESSION: 1. Active small-bowel bleed in the upper abdomen. 2. Endoscopy capsule in the mid descending colon. 3. Mild sigmoid diverticulosis. No bowel obstruction. Electronically Signed   By: Elgie Collard M.D.   On: 06/20/2023 21:36    EKG: Independently reviewed.  Last performed yesterday and showed sinus tachycardia at 111 bpm.  Evidence of left bundle branch block with QRS 140.  PVCs noted.  Similar to previous.  Assessment/Plan Principal Problem:   GI bleed Active Problems:   Upper GI bleed   Symptomatic anemia   Essential hypertension   Polyneuropathy   Pure hypercholesterolemia   Benign prostatic hyperplasia without lower urinary tract symptoms   B12 deficiency   Chronic prostatitis   Barrett's esophagus without dysplasia   GI bleed Symptomatic anemia > Initially presented to [facility] as per HPI on 11/16.  Workup as below.  Has received 1 to 2 units of transfusion per day with a total of 9 units transfused. > EGD showed Barrett's esophagus but no obvious source of bleeding. > Colonoscopy on 11/18 showed nonbleeding  hemorrhoids, nonbleeding diverticulosis, blood in the colon but no obvious bleeding source. > Capsule endoscopy on 11/19 showed findings of active bleeding but no obvious source. > Repeat CT angio on 11/21 showed small bowel bleed in the upper abdomen > IR attempted intervention but no active extravasation from SMA or distal branches was noted and no embolization  was performed. > Next step advanced endoscopy: [facility] and [facility] were at capacity and declined patient.  Dr. Barron Alvine now here accepted patient for trial of single lumen enteroscopy. - Monitor on telemetry - Appreciate GI recommendations and assistance - Type and screen, twice daily CBC - CLD for now - IV PPI twice daily - Supportive care  AKI > Creatinine elevated to around 1.3 from baseline of 1.  [facility] at [facility]. - Continue to trend renal function and electrolytes - Hold off on further IV fluids given shortage unless creatinine fails to improve  Hypertension - Continue home metoprolol  BPH - Continue home tamsulosin LovazaB12 deficiency - Continue home B12  Hyperlipidemia - Continue home atorvastatin, Lovaza  Neuropathy - Continue home gabapentin  Suprapubic tenderness > Noted at [facility], due to patient being status post cystoscopy with balloon urethral dilation on 11/5.  DVT prophylaxis: SCDs Code Status:   Full Family Communication:  Updated at bedside  Disposition Plan:   Patient is from:  [facility]  Anticipated DC to:  Home  Anticipated DC date:  2 to 7 days  Anticipated DC barriers: Need for advanced endoscopy  Consults called:  Gastroenterology notified of patient arrival Admission status:  Inpatient, telemetry  Severity of Illness: The appropriate patient status for this patient is INPATIENT. Inpatient status is judged to be reasonable and necessary in order to provide the required intensity of service to ensure the patient's safety. The patient's presenting symptoms, physical exam findings, and  initial radiographic and laboratory data in the context of their chronic comorbidities is felt to place them at high risk for further clinical deterioration. Furthermore, it is not anticipated that the patient will be medically stable for discharge from the hospital within 2 midnights of admission.   * I certify that at the point of admission it is my clinical judgment that the patient will require inpatient hospital care spanning beyond 2 midnights from the point of admission due to high intensity of service, high risk for further deterioration and high frequency of surveillance required.Synetta Fail MD [facility]  How to contact the [facility] Attending or Consulting provider 7A - 7P or covering provider during after hours 7P -7A, for this patient?   Check the care team in [facility] and look for a) attending/consulting [facility] provider listed and b) the [facility] team listed Log into [email or web address] and use [place]'s universal password to access. If you do not have the password, please contact the hospital operator. Locate the [facility] provider you are looking for under [facility] and page to a number that you can be directly reached. If you still have difficulty reaching the provider, please page the [facility] (Director on Call) for the Hospitalists listed on amion for assistance.  06/21/2023,

## 2023-06-21 NOTE — Progress Notes (Signed)
Referring Physician(s): Morrison,Brenda  Supervising Physician: Irish Lack  Patient Status:  [facility] - In-pt  Reason for Visit: GI bleed s/p mesenteric angiogram 11/21 via [facility]FA  Subjective: Patient denies any right groin access site pain, he denies any bleeding since yesterday and denies any new BM. Per patient and his wife he will be transferred to [facility] today.  Allergies: Amoxicillin, Other, and Amlodipine  Medications: Prior to Admission medications   Medication Sig Start Date End Date Taking? Authorizing Provider  acetaminophen (TYLENOL) 500 MG tablet Take 1,000 mg by mouth in the morning and at bedtime.   Yes [provider]  ALPHA LIPOIC ACID PO Take 1 capsule by mouth in the morning.   Yes [provider]  aspirin EC 81 MG tablet Take 81 mg by mouth at bedtime.   Yes [provider]  atorvastatin (LIPITOR) 20 MG tablet Take 20 mg by mouth at bedtime. 04/15/18  Yes [provider]  celecoxib (CELEBREX) 200 MG capsule Take 200 mg by mouth at bedtime. 04/20/18  Yes [provider]  chlorthalidone (HYGROTON) 25 MG tablet Take 25 mg by mouth in the morning. 03/08/23 03/07/24 Yes [provider]  Cholecalciferol 1000 units capsule Take 2,000 Units by mouth at bedtime.   Yes [provider]  diphenoxylate-atropine (LOMOTIL) 2.5-0.025 MG tablet Take 1 tablet by mouth 4 (four) times daily as needed for diarrhea or loose stools.   Yes [provider]  folic acid (FOLVITE) 1 MG tablet Take 2 mg by mouth in the morning.   Yes [provider]  gabapentin (NEURONTIN) 300 MG capsule Take 300 mg by mouth in the morning and at bedtime.   Yes [provider]  losartan (COZAAR) 100 MG tablet Take 50 mg by mouth 2 (two) times daily.   Yes [provider]  metoprolol succinate (TOPROL-XL) 100 MG 24 hr tablet Take 100 mg by mouth in the morning.   Yes [provider]  omega-3 acid ethyl esters  (LOVAZA) 1 g capsule Take 1 g by mouth in the morning and at bedtime. 02/02/18  Yes [provider]  oxybutynin (DITROPAN) 5 MG tablet 1 tab tid prn frequency,urgency, bladder spasm 06/05/23  Yes Stoioff, Scott C, MD  pantoprazole (PROTONIX) 40 MG tablet Take 40 mg by mouth at bedtime.   Yes [provider]  spironolactone (ALDACTONE) 100 MG tablet Take 100 mg by mouth in the morning.   Yes [provider]  tamsulosin (FLOMAX) 0.4 MG CAPS capsule Take 1 capsule (0.4 mg total) by mouth daily. 04/26/18  Yes Stoioff, Verna Czech, MD  triamcinolone ointment (KENALOG) 0.1 % Apply 1 Application topically 2 (two) times daily as needed (leg irritation/rash.). 06/05/17  Yes [provider]  vitamin B-12 (CYANOCOBALAMIN) 1000 MCG tablet Take 1,000 mcg by mouth at bedtime.   Yes [provider]  vitamin C (ASCORBIC ACID) 500 MG tablet Take 500 mg by mouth at bedtime.   Yes [provider]    Vital Signs: BP (!) 158/66 (BP Location: Left Arm)   Pulse 100   Temp 98.8 F (37.1 C) (Oral)   Resp 17   Ht 5\' 8"  (1.727 m)   Wt 190 lb (86.2 kg)   SpO2 94%   BMI 28.89 kg/m   Physical Exam  General: Alert, NAD Ext: RCFA dressing C/D/I-NT, soft, no signs of bleeding, hematoma or infection, DP + doppler, warm and sensation intact  Imaging: IR Angiogram Visceral Selective  Result Date: 06/21/2023  INDICATION: small bowel bleed UGIB. Active extravasation from mid small bowel on CTA EXAM: Procedure: MESENTERIC ARTERIOGRAPHY Including SUPERIOR MESENTERIC ARTERY, JEJUNAL ARTERIAL BRANCHES INTERROGATION COMPARISON:  CT AP, 06/20/2023 and 06/19/2023. MEDICATIONS: None ANESTHESIA/SEDATION: Moderate (conscious) sedation was employed during this procedure. A total of Versed 0.5 mg and Fentanyl 25 mcg was administered intravenously. Moderate Sedation Time: 47 minutes. The patient's level of consciousness and vital signs were monitored continuously by radiology nursing  throughout the procedure under my direct supervision. CONTRAST:  105 mL Omnipaque 300 FLUOROSCOPY TIME:  Fluoroscopic dose; 311 mGy COMPLICATIONS: None immediate. PROCEDURE: Informed consent was obtained from the patient and/or patient's representative following explanation of the procedure, risks, benefits and alternatives. All questions were addressed. A time out was performed prior to the initiation of the procedure. Maximal barrier sterile technique utilized including caps, mask, sterile gowns, sterile gloves, large sterile drape, hand hygiene, and sterile prep. The RIGHT femoral head was marked fluoroscopically. Under sterile conditions and local anesthesia, the RIGHT common femoral artery access was performed with a micropuncture needle. Under direct ultrasound guidance, the RIGHT common femoral was accessed with a micropuncture kit. An ultrasound image was saved for documentation purposes. A limited arteriogram was performed through the side arm of the sheath confirming appropriate access within the RIGHT femoral artery. This allowed for placement of a 5 Fr vascular sheath. Over a Bentson wire, a 5 Fr cobra 2 catheter was used to select the superior mesenteric artery. Selective mesenteric angiograms were performed. Despite extensive mesenteric territory vessel interrogation, no active extravasation was identified. Embolization was NOT performed. Images were reviewed and the procedure was terminated. All wires, catheters and sheaths were removed from the patient. Hemostasis was achieved at the RIGHT groin access site with Angio-Seal closure. The patient remained hemodynamically stable during the procedure, and was without immediate post procedural complication. FINDINGS: *Variant splanchnic arterial anatomy with a replaced common hepatic artery originating from the SMA. *Interrogation of the SMA and distal (jejunal) arterial branches, within the area of noted extravasation on CTA. *No active extravasation was  observed. Embolization was NOT performed. IMPRESSION: Mesenteric arteriography in interrogation for upper GI bleed. No active extravasation was observed on catheter angiography. Embolization was NOT performed. PLAN: - The patient is to remain flat for 2 hours with RIGHT leg straight. - if there remains clinical concern for persistent upper GI bleeding, would recommend coordinating with the Vascular and interventional Radiology (VIR) service to perform a CTA abdomen so as to expedite potential repeat angiography if the study is positive. Roanna Banning, MD [facility] Radiology Electronically Signed   By: Roanna Banning M.D.   On: 06/21/2023 07:07   CT Angio Abd/Pel w/ and/or w/o  Result Date: 06/20/2023 CLINICAL DATA:  Lower GI bleed. EXAM: CTA ABDOMEN AND PELVIS WITHOUT AND WITH CONTRAST TECHNIQUE: Multidetector CT imaging of the abdomen and pelvis was performed using the standard protocol during bolus administration of intravenous contrast. Multiplanar reconstructed images and MIPs were obtained and reviewed to evaluate the vascular anatomy. RADIATION DOSE REDUCTION: This exam was performed according to the departmental dose-optimization program which includes automated exposure control, adjustment of the mA and/or kV according to patient size and/or use of iterative reconstruction technique. CONTRAST:  OMNIPAQUE IOHEXOL 350 MG/ML SOLN COMPARISON:  CT dated 06/19/2023. FINDINGS: VASCULAR Aorta: Mild atherosclerotic calcification. No aneurysmal dilatation or dissection. No periaortic fluid collection. Celiac: The celiac trunk and its major branches are patent. SMA: Atherosclerotic calcification of the origin of the SMA. The  SMA is patent. The common hepatic artery arises from the SMA. Renals: The renal arteries are patent. IMA: The IMA is patent. Inflow: Mild atherosclerotic calcification. The iliac arteries are patent. No aneurysmal dilatation or dissection.  Proximal Outflow: The visualized proximal outflow is patent. Veins: The IVC is unremarkable. The SMV, splenic vein, and main portal vein are patent. No portal venous gas. Review of the MIP images confirms the above findings. NON-VASCULAR Lower chest: The visualized lung bases are clear. There is coronary vascular calcification. No intra-abdominal free air or free fluid. Hepatobiliary: The liver is unremarkable. No biliary dilatation. High attenuating content within the gallbladder, likely vicariously excreted contrast from recent study. Pancreas: Unremarkable. No pancreatic ductal dilatation or surrounding inflammatory changes. Spleen: Normal in size without focal abnormality. Adrenals/Urinary Tract: The adrenal glands are unremarkable. There is no hydronephrosis or nephrolithiasis on either side. Bilateral renal cysts. The visualized ureters and urinary bladder appear unremarkable. Stomach/Bowel: Endoscopy capsule with associated streak artifact in the mid descending colon. There is mild sigmoid diverticulosis without active inflammatory changes. Evaluation for GI bleed is limited due to presence of high attenuating content in the colon. There is however pooling of contrast in a small bowel loop in the upper abdomen to the right of the midline (images 33-39 on axial series 15) consistent with active small-bowel bleed. There is no bowel obstruction. Appendectomy. Lymphatic: No adenopathy. Reproductive: The prostate and seminal vesicles are grossly unremarkable. Other: Small fat containing left inguinal hernia. No bowel herniation or fluid collection. Musculoskeletal: Degenerative changes of the spine. Bilateral femoral head avascular necrosis, right greater than left. No acute osseous pathology. IMPRESSION: 1. Active small-bowel bleed in the upper abdomen. 2. Endoscopy capsule in the mid descending colon. 3. Mild sigmoid diverticulosis. No bowel obstruction. Electronically Signed   By: Elgie Collard M.D.   On:  06/20/2023 21:36   CT Angio Abd/Pel w/ and/or w/o  Result Date: 06/19/2023 CLINICAL DATA:  Lower GI bleed EXAM: CTA ABDOMEN AND PELVIS WITHOUT AND WITH CONTRAST TECHNIQUE: Multidetector CT imaging of the abdomen and pelvis was performed using the standard protocol during bolus administration of intravenous contrast. Multiplanar reconstructed images and MIPs were obtained and reviewed to evaluate the vascular anatomy. RADIATION DOSE REDUCTION: This exam was performed according to the departmental dose-optimization program which includes automated exposure control, adjustment of the mA and/or kV according to patient size and/or use of iterative reconstruction technique. CONTRAST:  OMNIPAQUE IOHEXOL 350 MG/ML SOLN COMPARISON:  None Available. FINDINGS: VASCULAR Aorta: Normal caliber aorta without aneurysm, dissection, vasculitis or significant stenosis. Mild scattered atherosclerotic plaque. Celiac: Patent without evidence of aneurysm, dissection, vasculitis or significant stenosis. The common hepatic artery is replaced to the SMA. SMA: Patent without evidence of aneurysm, dissection, vasculitis or significant stenosis. Replaced common hepatic artery. Renals: Both renal arteries are patent without evidence of aneurysm, dissection, vasculitis, fibromuscular dysplasia or significant stenosis. IMA: Patent without evidence of aneurysm, dissection, vasculitis or significant stenosis. Inflow: Patent without evidence of aneurysm, dissection, vasculitis or significant stenosis. Proximal Outflow: Bilateral common femoral and visualized portions of the superficial and profunda femoral arteries are patent without evidence of aneurysm, dissection, vasculitis or significant stenosis. Veins: No focal venous abnormality. Review of the MIP images confirms the above findings. NON-VASCULAR Lower chest: The intracardiac blood pool is hypodense relative to the adjacent myocardium consistent with anemia. Hepatobiliary: No focal  liver abnormality is seen. No gallstones, gallbladder wall thickening, or biliary dilatation. Pancreas: Unremarkable. No pancreatic ductal dilatation or surrounding inflammatory changes. Spleen:  Normal in size without focal abnormality. Adrenals/Urinary Tract: Normal adrenal glands. No hydronephrosis, nephrolithiasis or enhancing renal mass. Multiple water attenuation renal lesions bilaterally consistent with simple cysts. The ureters and bladder are unremarkable. Stomach/Bowel: No evidence of extravasation of contrast material into the stomach, large bowel or small bowel. An endoscopic capsule is present within the cecum. No focal bowel wall thickening or evidence of obstruction. Lymphatic: No suspicious lymphadenopathy. Reproductive: Prostate is unremarkable. Other: Fat containing left inguinal hernia. Musculoskeletal: No acute fracture or aggressive appearing lytic or blastic osseous lesion. IMPRESSION: 1. Negative for active GI bleed at this time. 2. The intracardiac blood pool is hypodense relative to the adjacent myocardium consistent with anemia. 3. Endoscopic capsule present within the cecum. 4. No focal bowel wall thickening, significant diverticulosis or evidence of obstruction. 5. Omental fat containing left inguinal hernia. 6. Common hepatic artery is replaced to the SMA. Aortic Atherosclerosis (ICD10-I70.0). Signed, Sterling Big, MD, RPVI [facility] Radiology Electronically Signed   By: Malachy Moan M.D.   On: 06/19/2023 16:02    Labs:  CBC: Recent Labs    06/20/23 0631 06/20/23 1646 06/20/23 2238 06/21/23 0255 06/21/23 0922  WBC 11.6* 10.8* 13.5* 14.4*  --   HGB 6.7* 6.1* 7.4* 6.9* 8.0*  HCT 19.6* 18.0* 21.8* 20.3* 23.4*  PLT 144* 137* 155 148*  --     COAGS: Recent Labs    06/16/23 1411 06/20/23 2238  INR 1.2 1.2  APTT 22* 23*    BMP: Recent Labs    06/19/23 0445 06/20/23 0631 06/20/23 2238 06/21/23 0255   NA 139 140 137 139  K 3.8 4.1 4.2 4.4  CL 106 110 107 108  CO2 25 26 23 24   GLUCOSE 144* 157* 168* 181*  BUN 29* 34* 31* 33*  CALCIUM 7.7* 7.7* 7.0* 7.2*  CREATININE 1.05 1.36* 1.14 1.27*  GFRNONAA >60 54* >60 59*    LIVER FUNCTION TESTS: Recent Labs    06/20/23 2238  BILITOT 0.8  AST 13*  ALT 10  ALKPHOS 29*  PROT 4.1*  ALBUMIN 2.3*    Assessment and Plan: 77 year old male with acute GI bleed, positive CTA 11/20 s/p mesenteric angiogram in IR on 11/20 with no active bleed seen at the time of angiogram therefore NO embolization was performed.   RCFA access site without complications, Hgb trend up 8 (6.9) 1 unit of PRBC this morning, VSS without vasopressor support, no further BM or bleeding today, Cr. slightly elevated- will need to monitor closely given the contrast amount given with CTA and angiogram.   Patient to be transferred to [facility] per patient and his spouse for double balloon enteroscopy.    Electronically Signed: Berneta Levins, PA-C 06/21/2023, 2:42 PM   I spent a total of 15 Minutes at the the patient's bedside AND on the patient's hospital floor or unit, greater than 50% of which was counseling/coordinating care for GI bleed.

## 2023-06-21 NOTE — Progress Notes (Signed)
Got a call from lab with Critical hemoglobin 6.3. [name], MD was notified.   Harry Patterson

## 2023-06-21 NOTE — Progress Notes (Signed)
Pt had an episode of svt around 2130H this evening. HR went up to 200bpm. Went on approximately 3 minutes. Pt asymptomatic. 12L EKG done, but unable to capture the tachycardia episode. HR went down to 110s during EKG. NP Jon Billings informed via secure chat. IV Metoprolol was ordered and given.   2330H: Pt went to IR for a stat procedure. Stable vital signs.

## 2023-06-22 ENCOUNTER — Encounter (HOSPITAL_COMMUNITY)
Admission: AD | Disposition: A | Discharge status: Home Health | Payer: Self-pay | Source: Other Acute Inpatient Hospital | Attending: Family Medicine

## 2023-06-22 ENCOUNTER — Inpatient Hospital Stay (HOSPITAL_COMMUNITY): Payer: BC Managed Care – PPO | Admitting: Anesthesiology

## 2023-06-22 ENCOUNTER — Encounter (HOSPITAL_COMMUNITY): Payer: Self-pay | Admitting: Internal Medicine

## 2023-06-22 DIAGNOSIS — K922 Gastrointestinal hemorrhage, unspecified: Secondary | ICD-10-CM | POA: Diagnosis not present

## 2023-06-22 DIAGNOSIS — D62 Acute posthemorrhagic anemia: Secondary | ICD-10-CM | POA: Diagnosis not present

## 2023-06-22 DIAGNOSIS — K921 Melena: Secondary | ICD-10-CM

## 2023-06-22 DIAGNOSIS — K31819 Angiodysplasia of stomach and duodenum without bleeding: Secondary | ICD-10-CM | POA: Diagnosis not present

## 2023-06-22 DIAGNOSIS — K2270 Barrett's esophagus without dysplasia: Secondary | ICD-10-CM | POA: Diagnosis not present

## 2023-06-22 DIAGNOSIS — D649 Anemia, unspecified: Secondary | ICD-10-CM | POA: Diagnosis not present

## 2023-06-22 DIAGNOSIS — K5521 Angiodysplasia of colon with hemorrhage: Secondary | ICD-10-CM

## 2023-06-22 DIAGNOSIS — I10 Essential (primary) hypertension: Secondary | ICD-10-CM | POA: Diagnosis not present

## 2023-06-22 HISTORY — PX: SUBMUCOSAL TATTOO INJECTION: SHX6856

## 2023-06-22 HISTORY — PX: BALLOON ENTEROSCOPY: SHX6863

## 2023-06-22 HISTORY — PX: HOT HEMOSTASIS: SHX5433

## 2023-06-22 LAB — BPAM RBC
Blood Product Expiration Date: 202412202359
Blood Product Expiration Date: 202412202359
Blood Product Expiration Date: 202412202359
ISSUE DATE / TIME: 202411201153
ISSUE DATE / TIME: 202411201806
ISSUE DATE / TIME: 202411210500
Unit Type and Rh: 6200
Unit Type and Rh: 6200
Unit Type and Rh: 6200

## 2023-06-22 LAB — COMPREHENSIVE METABOLIC PANEL
ALT: 8 U/L (ref 0–44)
AST: 14 U/L — ABNORMAL LOW (ref 15–41)
Albumin: 2 g/dL — ABNORMAL LOW (ref 3.5–5.0)
Alkaline Phosphatase: 26 U/L — ABNORMAL LOW (ref 38–126)
Anion gap: 8 (ref 5–15)
BUN: 38 mg/dL — ABNORMAL HIGH (ref 8–23)
CO2: 23 mmol/L (ref 22–32)
Calcium: 7.1 mg/dL — ABNORMAL LOW (ref 8.9–10.3)
Chloride: 107 mmol/L (ref 98–111)
Creatinine, Ser: 1.42 mg/dL — ABNORMAL HIGH (ref 0.61–1.24)
GFR, Estimated: 51 mL/min — ABNORMAL LOW (ref >60)
Glucose, Bld: 166 mg/dL — ABNORMAL HIGH (ref 70–99)
Potassium: 4.3 mmol/L (ref 3.5–5.1)
Sodium: 138 mmol/L (ref 135–145)
Total Bilirubin: 0.6 mg/dL (ref <1.2)
Total Protein: 3.6 g/dL — ABNORMAL LOW (ref 6.5–8.1)

## 2023-06-22 LAB — TYPE AND SCREEN
ABO/RH(D): A POS
Antibody Screen: NEGATIVE
Unit division: 0
Unit division: 0
Unit division: 0

## 2023-06-22 LAB — CBC
HCT: 23.3 % — ABNORMAL LOW (ref 39.0–52.0)
HCT: 22.1 % — ABNORMAL LOW (ref 39.0–52.0)
Hemoglobin: 7.6 g/dL — ABNORMAL LOW (ref 13.0–17.0)
Hemoglobin: 7.1 g/dL — ABNORMAL LOW (ref 13.0–17.0)
MCH: 30.6 pg (ref 26.0–34.0)
MCH: 29.5 pg (ref 26.0–34.0)
MCHC: 32.6 g/dL (ref 30.0–36.0)
MCHC: 32.1 g/dL (ref 30.0–36.0)
MCV: 94 fL (ref 80.0–100.0)
MCV: 91.7 fL (ref 80.0–100.0)
Platelets: 141 10*3/uL — ABNORMAL LOW (ref 150–400)
Platelets: 124 10*3/uL — ABNORMAL LOW (ref 150–400)
RBC: 2.48 MIL/uL — ABNORMAL LOW (ref 4.22–5.81)
RBC: 2.41 MIL/uL — ABNORMAL LOW (ref 4.22–5.81)
RDW: 19.6 % — ABNORMAL HIGH (ref 11.5–15.5)
RDW: 20 % — ABNORMAL HIGH (ref 11.5–15.5)
WBC: 15.3 10*3/uL — ABNORMAL HIGH (ref 4.0–10.5)
WBC: 16.5 10*3/uL — ABNORMAL HIGH (ref 4.0–10.5)
nRBC: 2.5 % — ABNORMAL HIGH (ref 0.0–0.2)
nRBC: 1.9 % — ABNORMAL HIGH (ref 0.0–0.2)

## 2023-06-22 LAB — HEMOGLOBIN AND HEMATOCRIT, BLOOD
HCT: 21.3 % — ABNORMAL LOW (ref 39.0–52.0)
Hemoglobin: 7.1 g/dL — ABNORMAL LOW (ref 13.0–17.0)

## 2023-06-22 LAB — PREPARE RBC (CROSSMATCH)

## 2023-06-22 SURGERY — ENTEROSCOPY, USING BALLOON
Anesthesia: General

## 2023-06-22 MED ORDER — SODIUM CHLORIDE 0.9 % IV SOLN
INTRAVENOUS | Status: DC | PRN
Start: 2023-06-22 — End: 2023-06-22

## 2023-06-22 MED ORDER — FENTANYL CITRATE (PF) 100 MCG/2ML IJ SOLN
INTRAMUSCULAR | Status: AC
  Filled 2023-06-22: qty 2

## 2023-06-22 MED ORDER — VASOPRESSIN 20 UNIT/ML IV SOLN
INTRAVENOUS | Status: DC | PRN
  Administered 2023-06-22: 1 [IU] via INTRAVENOUS

## 2023-06-22 MED ORDER — PHENOL 1.4 % MT LIQD
1.0000 | OROMUCOSAL | Status: DC | PRN
  Administered 2023-06-22: 1 via OROMUCOSAL
  Filled 2023-06-22: qty 177

## 2023-06-22 MED ORDER — ROCURONIUM BROMIDE 100 MG/10ML IV SOLN
INTRAVENOUS | Status: DC | PRN
  Administered 2023-06-22: 30 mg via INTRAVENOUS

## 2023-06-22 MED ORDER — FENTANYL CITRATE (PF) 100 MCG/2ML IJ SOLN
INTRAMUSCULAR | Status: DC | PRN
  Administered 2023-06-22 (×2): 50 ug via INTRAVENOUS

## 2023-06-22 MED ORDER — LIDOCAINE 2% (20 MG/ML) 5 ML SYRINGE
INTRAMUSCULAR | Status: DC | PRN
  Administered 2023-06-22: 80 mg via INTRAVENOUS

## 2023-06-22 MED ORDER — OCTREOTIDE ACETATE 100 MCG/ML IJ SOLN
100.0000 ug | Freq: Two times a day (BID) | INTRAMUSCULAR | Status: DC
  Administered 2023-06-22 – 2023-06-29 (×15): 100 ug via SUBCUTANEOUS
  Filled 2023-06-22 (×19): qty 1

## 2023-06-22 MED ORDER — PROPOFOL 10 MG/ML IV BOLUS
INTRAVENOUS | Status: DC | PRN
  Administered 2023-06-22: 120 mg via INTRAVENOUS

## 2023-06-22 MED ORDER — SPOT INK MARKER SYRINGE KIT
PACK | SUBMUCOSAL | Status: DC | PRN
  Administered 2023-06-22: 2.5 mL via SUBMUCOSAL

## 2023-06-22 MED ORDER — SPOT INK MARKER SYRINGE KIT
PACK | SUBMUCOSAL | Status: AC
  Filled 2023-06-22: qty 5

## 2023-06-22 MED ORDER — ONDANSETRON HCL 4 MG/2ML IJ SOLN
INTRAMUSCULAR | Status: DC | PRN
  Administered 2023-06-22: 4 mg via INTRAVENOUS

## 2023-06-22 MED ORDER — SODIUM CHLORIDE 0.9% FLUSH
10.0000 mL | Freq: Two times a day (BID) | INTRAVENOUS | Status: DC
Start: 2023-06-22 — End: 2023-06-22

## 2023-06-22 MED ORDER — SODIUM CHLORIDE 0.9% IV SOLUTION: Freq: Once | INTRAVENOUS | Status: DC

## 2023-06-22 MED ORDER — SUCCINYLCHOLINE CHLORIDE 200 MG/10ML IV SOSY
PREFILLED_SYRINGE | INTRAVENOUS | Status: DC | PRN
  Administered 2023-06-22: 100 mg via INTRAVENOUS

## 2023-06-22 MED ORDER — PHENYLEPHRINE 80 MCG/ML (10ML) SYRINGE FOR IV PUSH (FOR BLOOD PRESSURE SUPPORT)
PREFILLED_SYRINGE | INTRAVENOUS | Status: DC | PRN
  Administered 2023-06-22 (×2): 200 ug via INTRAVENOUS

## 2023-06-22 MED ORDER — PHENYLEPHRINE HCL-NACL 20-0.9 MG/250ML-% IV SOLN
INTRAVENOUS | Status: DC | PRN
  Administered 2023-06-22: 30 ug/min via INTRAVENOUS

## 2023-06-22 MED ORDER — SUGAMMADEX SODIUM 200 MG/2ML IV SOLN
INTRAVENOUS | Status: DC | PRN
  Administered 2023-06-22: 200 mg via INTRAVENOUS

## 2023-06-22 MED ORDER — DEXAMETHASONE SODIUM PHOSPHATE 10 MG/ML IJ SOLN
INTRAMUSCULAR | Status: DC | PRN
  Administered 2023-06-22: 10 mg via INTRAVENOUS

## 2023-06-22 NOTE — Anesthesia Postprocedure Evaluation (Signed)
Anesthesia Post Note  Patient: Harry Patterson.  Procedure(s) Performed: BALLOON ENTEROSCOPY HOT HEMOSTASIS (ARGON PLASMA COAGULATION/BICAP) SUBMUCOSAL TATTOO INJECTION     Patient location during evaluation: Endoscopy Anesthesia Type: General Level of consciousness: awake and alert Pain management: pain level controlled Vital Signs Assessment: post-procedure vital signs reviewed and stable Respiratory status: spontaneous breathing, nonlabored ventilation, respiratory function stable and patient connected to nasal cannula oxygen Cardiovascular status: blood pressure returned to baseline and stable Postop Assessment: no apparent nausea or vomiting Anesthetic complications: no   There were no known notable events for this encounter.  Last Vitals:  Vitals:   06/22/23 1100 06/22/23 1115  BP: 137/60 132/61  Pulse: 82 81  Resp: 11 14  Temp:  (!) 36.4 C  SpO2: 93% 92%    Last Pain:  Vitals:   06/22/23 1045  TempSrc:   PainSc: 0-No pain                 Collene Schlichter

## 2023-06-22 NOTE — Transfer of Care (Signed)
Immediate Anesthesia Transfer of Care Note  Patient: Harry Patterson.  Procedure(s) Performed: BALLOON ENTEROSCOPY HOT HEMOSTASIS (ARGON PLASMA COAGULATION/BICAP) SUBMUCOSAL TATTOO INJECTION  Patient Location: PACU  Anesthesia Type:General  Level of Consciousness: awake, alert , and oriented  Airway & Oxygen Therapy: Patient Spontanous Breathing and Patient connected to face mask oxygen  Post-op Assessment: Report given to RN and Post -op Vital signs reviewed and stable  Post vital signs: Reviewed and stable  Last Vitals:  Vitals Value Taken Time  BP 146/65 06/22/23 1045  Temp    Pulse 93 06/22/23 1045  Resp 14 06/22/23 1047  SpO2 82 % 06/22/23 1045  Vitals shown include unfiled device data.  Last Pain:  Vitals:   06/22/23 0918  TempSrc: Temporal  PainSc:          Complications: No notable events documented.

## 2023-06-22 NOTE — Progress Notes (Signed)
[facility]  PROGRESS NOTE  Harry Patterson. IHK:[id number] DOB: Dec 02, 1945 DOA: 06/21/2023 PCP: Lynnea Ferrier, MD   Brief HPI:    77 y.o. male with medical history significant of hypertension, neuropathy, hyperlipidemia, alcohol use, B12 deficiency, Barrett's esophagus, BPH, chronic prostatitis presenting with ongoing GI bleed despite interventions at [facility].  Workup at [facility] included EGD with Barrett's esophagus but no source of bleeding. Colonoscopy with hemorrhoids and diverticulosis with no source of bleeding but blood noted in the colon. Capsule endoscopy without obvious source of bleeding. CTA showing small upper bleed. IR intervention but no obvious extravasation so no embolization formed.  Recommended for advanced endoscopy but declined by [facility] or [facility] due to capacity. Accepted by [facility] here for trial of single lumen enteroscopy.    Assessment/Plan:   GI bleed/symptomatic anemia -Initially presented to [facility] with GI bleed; underwent EGD, colonoscopy -EGD only showed Barrett esophagus, no obvious source of bleeding -Colonoscopy on 11/18 showed nonbleeding hemorrhoids, nonbleeding diverticulosis, blood in the colon but no obvious source of bleeding -Capsule endoscopy on 11/19 showed findings of active bleeding but no obvious source -CTA on 11/21 showed small bowel bleed in the upper abdomen; IR attempted intervention but no extravasation from SMA or distal branches was noted and no embolization was performed -Neck step advanced endoscopy, [facility] and [facility] for at capacity and declined transfer -GI Dr Barron Alvine accepted the patient for trial of single lumen enteroscopy -Underwent enteroscopy today 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  If continues to have bleeding, may need repeat CTA or capsule endoscopy.  For transfer to academic center for further  options  Anemia, acute blood loss -Secondary to above -Has been getting blood transfusion 1 to 2 units/day with total of 10 units transfused -Hemoglobin is stable today at 7.6 -Continue Protonix, octreotide -Follow CBC in a.m.  Acute kidney injury -Creatinine was elevated 1.3 from baseline 1.0 -Received IV fluids at [facility] -Creatinine is 1.42 today  Hypertension -Continue metoprolol  BPH -Hold home tamsulosin  B12 deficiency -Continue B12 supplementation  Neuropathy -Continue gabapentin  Suprapubic tenderness > Noted at [facility], due to patient being status post cystoscopy with balloon urethral dilation on 11/5.  Medications     sodium chloride   Intravenous Once   ascorbic acid  500 mg Oral QHS   atorvastatin  20 mg Oral QHS   cholecalciferol  2,000 Units Oral QHS   cyanocobalamin  1,000 mcg Oral QHS   folic acid  2 mg Oral q AM   gabapentin  300 mg Oral BID   metoprolol succinate  100 mg Oral Daily   octreotide  100 mcg Subcutaneous Q12H   omega-3 acid ethyl esters  1 g Oral BID   pantoprazole  40 mg Intravenous Q12H   sodium chloride flush  3 mL Intravenous Q12H   tamsulosin  0.4 mg Oral Daily     Data Reviewed:   CBG:  Recent Labs  Lab 06/17/23 0959  GLUCAP 145*    SpO2: 92 % O2 Flow Rate (L/min): 0 L/min    Vitals:   06/22/23 0918 06/22/23 1045 06/22/23 1100 06/22/23 1115  BP: 125/79 (!) 146/65 137/60 132/61  Pulse: 85 93 82 81  Resp: 12 17 11 14   Temp: 97.8 F (36.6 C) (!) 97.5 F (36.4 C)  (!) 97.5 F (36.4 C)  TempSrc: Temporal     SpO2:  98% 93% 93% 92%  Weight:      Height:          Data Reviewed:  Basic Metabolic Panel: Recent Labs  Lab 06/19/23 0445 06/20/23 0631 06/20/23 2238 06/21/23 0255 06/22/23 0326  NA 139 140 137 139 138  K 3.8 4.1 4.2 4.4 4.3  CL 106 110 107 108 107  CO2 25 26 23 24 23   GLUCOSE 144* 157* 168* 181* 166*  BUN 29* 34* 31* 33* 38*  CREATININE 1.05 1.36* 1.14 1.27* 1.42*  CALCIUM 7.7* 7.7* 7.0*  7.2* 7.1*  MG  --   --  2.1  --   --     CBC: Recent Labs  Lab 06/19/23 0445 06/19/23 1147 06/20/23 0631 06/20/23 1646 06/20/23 2238 06/21/23 0255 06/21/23 0922 06/21/23 2009 06/22/23 0326 06/22/23 1226  WBC 9.9  --  11.6* 10.8* 13.5* 14.4*  --  14.9*  --  15.3*  NEUTROABS 6.9  --  7.7 7.7 9.7* 11.1*  --   --   --   --   HGB 6.0*   < > 6.7* 6.1* 7.4* 6.9* 8.0* 6.3* 7.1* 7.6*  HCT 17.6*   < > 19.6* 18.0* 21.8* 20.3* 23.4* 20.0* 21.3* 23.3*  MCV 96.2  --  95.1 91.4 90.8 93.1  --  98.5  --  94.0  PLT 144*  --  144* 137* 155 148*  --  166  --  141*   < > = values in this interval not displayed.    LFT Recent Labs  Lab 06/20/23 2238 06/22/23 0326  AST 13* 14*  ALT 10 8  ALKPHOS 29* 26*  BILITOT 0.8 0.6  PROT 4.1* 3.6*  ALBUMIN 2.3* 2.0*     Antibiotics: Anti-infectives (From admission, onward)    None        DVT prophylaxis: SCDs  Code Status: Full code  Family Communication: Discussed with patient wife at bedside   CONSULTS gastroenterology   Subjective   Seen after single lumen enteroscopy done today.  Enteroscopy was unremarkable for any active bleeding.   Objective    Physical Examination:   General-appears in no acute distress Heart-S1-S2, regular, no murmur auscultated Abdomen-soft, nontender, no organomegaly Extremities-no edema in the lower extremities Neuro-alert, oriented x3, no focal deficit noted  Status is: Inpatient:             [name] [name]   [facility] If 7PM-7AM, please contact night-coverage at [email or web address], Office  [phone]   06/22/2023, 2:31 PM  LOS: 1 day

## 2023-06-22 NOTE — Progress Notes (Signed)
Received a call from tele that patient HR was 203 at 1923, patient HR was sustaining in the 120s 130s when nurse assessed him and he was asymptomatic. [name], MD was notified at 1936 regarding the yellow MEWs and patient's HR.  At 2030, Patient's HR was back up in the 140s, patient symptomatic this time complaining of SOB and later some dizziness with movement. Schedule metoprolol was administered and rapid response team called. [name], MD was also notified again of patient's change in condition.   Temitope Griffing

## 2023-06-22 NOTE — Progress Notes (Signed)
Blood infusion rate increased to 500 mL/hr by MD Desmond Lope.  Eulas Post, RN 06/22/23 8:34 AM

## 2023-06-22 NOTE — Anesthesia Preprocedure Evaluation (Signed)
Anesthesia Evaluation  Patient identified by MRN, date of birth, ID band Patient awake    Reviewed: Allergy & Precautions, NPO status , Patient's Chart, lab work & pertinent test results, reviewed documented beta blocker date and time   Airway Mallampati: III  TM Distance: <3 FB Neck ROM: Full    Dental  (+) Dental Advisory Given, Missing, Poor Dentition, Chipped   Pulmonary sleep apnea and Continuous Positive Airway Pressure Ventilation    Pulmonary exam normal breath sounds clear to auscultation       Cardiovascular hypertension, Pt. on home beta blockers and Pt. on medications + CAD  Normal cardiovascular exam+ dysrhythmias (LBBB)  Rhythm:Regular Rate:Normal     Neuro/Psych  Neuromuscular disease  negative psych ROS   GI/Hepatic ,GERD  Medicated,,(+)     substance abuse  Melena, acute blood loss anemia, active bleeding on video capsule endoscopy   Endo/Other  negative endocrine ROS    Renal/GU Renal InsufficiencyRenal disease     Musculoskeletal  (+) Arthritis ,    Abdominal   Peds  Hematology  (+) Blood dyscrasia, anemia   Anesthesia Other Findings Day of surgery medications reviewed with the patient.  Reproductive/Obstetrics                              Anesthesia Physical Anesthesia Plan  ASA: 4  Anesthesia Plan: General   Post-op Pain Management: Minimal or no pain anticipated   Induction: Intravenous  PONV Risk Score and Plan: 2 and Dexamethasone and Ondansetron  Airway Management Planned: Oral ETT and Video Laryngoscope Planned  Additional Equipment:   Intra-op Plan:   Post-operative Plan: Extubation in OR  Informed Consent: I have reviewed the patients History and Physical, chart, labs and discussed the procedure including the risks, benefits and alternatives for the proposed anesthesia with the patient or authorized representative who has indicated his/her  understanding and acceptance.     Dental advisory given  Plan Discussed with: CRNA  Anesthesia Plan Comments:          Anesthesia Quick Evaluation

## 2023-06-22 NOTE — Interval H&P Note (Signed)
History and Physical Interval Note:  Persistent bleeding overnight with BRBPR and blood clots.  Repeat H/H 6.3/20 last evening was transfused 1 unit RBCs with repeat 7.1/21.3 this morning.  Ordered for 1 more unit which is currently transfusing in preop.  Was tachycardic which improved with RBC transfusion.  Reviewed notes from [facility] course.  Has undergone EGD, colonoscopy without source of bleeding.  VCE with active brisk bleeding and what appears to be the mid small bowel.  There was a question of additional bleeding site in the distal small bowel within a couple of minutes of the cecum.  Hopefully that represents blood refluxing up from the colon (there was a good amount of old blood in the colon during colonoscopy) and not a second bleeding spot.  CTA with active bleeding and what appears to be the mid small bowel in the RUQ, but unfortunately subsequent angiography was negative so selective coil embolization could not be performed.  Given his continued bleeding, he was transferred to [facility] last evening for attempt at single balloon enteroscopy today for therapeutic intent.  Case discussed with Anesthesia service and plan for general anesthesia.  06/22/2023 8:29 AM  Harry Patterson.  has presented today for surgery, with the diagnosis of Melena, acute blood loss anemia, active bleeding on video capsule endoscopy.  The various methods of treatment have been discussed with the patient and family. After consideration of risks, benefits and other options for treatment, the patient has consented to  Procedure(s): BALLOON ENTEROSCOPY (Left) as a surgical intervention.  The patient's history has been reviewed, patient examined, no change in status, stable for surgery.  I have reviewed the patient's chart and labs.  Questions were answered to the patient's satisfaction.     Verlin Dike Dorene Bruni

## 2023-06-22 NOTE — Op Note (Addendum)
[facility] Patient Name: Harry Patterson Procedure Date : 06/22/2023 MRN: [id number] Attending MD: Doristine Locks , MD, [id number] Date of Birth: 1946-01-17 CSN: [id number] Age: 77 Admit Type: Inpatient Procedure:                Single balloon enteroscopy with control of bleeding                            and submucosal injection Indications:              Abnormal CT angiography, Abnormal video capsule                            endoscopy, Acute post hemorrhagic anemia, GI                            bleeding source not documented by previous UGI                            endoscopy, GI bleeding source not documented by                            previous colonoscopy                           77 yo male admitted to [facility] with                            hematochezia and acute blood loss anemia. Has                            undergone EGD, colonoscopy without source of                            bleeding. VCE with active brisk bleeding and what                            appears to be the mid small bowel. There was a                            question of additional bleeding site in the distal                            small bowel within a couple of minutes of the cecum                            (vs old or refluxed blood). CTA with active                            bleeding in what appears to be the mid small bowel                            in the RUQ, but unfortunately subsequent  angiography was negative so selective coil                            embolization could not be performed. Given his                            continued bleeding, he was transferred to [facility] last evening for attempt at single balloon                            enteroscopy. Has received a total of 13 units RBCs                            on this admission thus far. Providers:                Doristine Locks, MD, Fransisca Connors, Kandice Robinsons, Technician Referring MD:             Angus Seller Patel-nguyen Medicines:                General Anesthesia Complications:            No immediate complications. Estimated Blood Loss:     Estimated blood loss was minimal. Procedure:                Pre-Anesthesia Assessment:                           - Prior to the procedure, a History and Physical                            was performed, and patient medications and                            allergies were reviewed. The patient's tolerance of                            previous anesthesia was also reviewed. The risks                            and benefits of the procedure and the sedation                            options and risks were discussed with the patient.                            All questions were answered, and informed consent                            was obtained. Prior Anticoagulants: The patient has  taken no anticoagulant or antiplatelet agents. ASA                            Grade Assessment: III - A patient with severe                            systemic disease. After reviewing the risks and                            benefits, the patient was deemed in satisfactory                            condition to undergo the procedure.                           After obtaining informed consent, the endoscope was                            passed under direct vision. Throughout the                            procedure, the patient's blood pressure, pulse, and                            oxygen saturations were monitored continuously. The                            [id number]-Q180 ([id number]) Olympus enteroscope was                            introduced through the mouth and ultimately                            advanced to the distal jejunum. The enteroscope was                            advanced deep into the small bowel. The overtube                             was advanced into position. Through a series of                            enteroscope advancement/withdrawals and balloon                            inflation/deflation, the enteroscope was further                            advanced deep into the small bowel. The small bowel                            enteroscopy was accomplished without difficulty.  The patient tolerated the procedure well. Scope In: Scope Out: Findings:      There were esophageal mucosal changes classified as Barrett's stage       C2-M3 per Prague criteria present in the lower third of the esophagus.      Aside from some small, benign fundic gland polyps, the stomach was       normal.      Two tiny, non-bleeding angioectasias were found in the duodenal bulb and       in the fourth portion of the duodenum. Coagulation for hemostasis using       argon plasma was successful. Estimated blood loss: none.      The remainder of the small bowel to the distal jejunum was normal       appearing. The distal extent reached was tattooed with an injection of 2       mL of Spot (carbon black). Impression:               - Benign fundic gland polyps, otherwise, normal                            stomach.                           - Esophageal mucosal changes classified as                            Barrett's stage C2-M3 per Prague criteria.                           - Two tiny non-bleeding angioectasias in the                            duodenum. Treated with argon plasma coagulation                            (APC). These are not the source of anemia and                            recurrent bleeding, however.                           - Through a series of enteroscope                            advancement/withdrawals along with balloon                            inflation/deflation to advance the enteroscope deep                            into the small bowel. The remainder of the small                             bowel to the distal jejunum was normal. Tattoo was  placed at the distal extent reached.                           - No specimens collected. Recommendation:           - Return patient to hospital ward for ongoing care.                           - Clear liquid diet.                           - Continue serial CBC checks with additional blood                            products as needed per protocol                           - If rebleeding, can consider either repeat video                            capsule endoscopy, repeat CT angiography, or may                            need to consider [facility] for additional endoscopic modalities (i.e.                            double-balloon enteroscopy or retrograde device                            assisted enteroscopy)                           - Will start on octreotide 100 mcg twice daily SQ.                           - Results discussed with patient, spouse by phone,                            and with the primary inpatient Hospitalist service. Procedure Code(s):        --- Professional ---                           [id number], Small intestinal endoscopy, enteroscopy                            beyond second portion of duodenum, not including                            ileum; with control of bleeding (eg, injection,                            bipolar cautery, unipolar cautery, laser, heater  probe, stapler, plasma coagulator)                           [phone], Unlisted procedure, small intestine Diagnosis Code(s):        --- Professional ---                           K22.70, Barrett's esophagus without dysplasia                           K31.819, Angiodysplasia of stomach and duodenum                            without bleeding                           R93.3, Abnormal findings on diagnostic imaging of                             other parts of digestive tract                           D62, Acute posthemorrhagic anemia                           K92.2, Gastrointestinal hemorrhage, unspecified CPT copyright 2022 American Medical Association. All rights reserved. The codes documented in this report are preliminary and upon coder review may  be revised to meet current compliance requirements. Doristine Locks, MD 06/22/2023 10:51:37 AM Number of Addenda: 0

## 2023-06-23 ENCOUNTER — Other Ambulatory Visit: Payer: Self-pay | Admitting: Internal Medicine

## 2023-06-23 ENCOUNTER — Inpatient Hospital Stay (HOSPITAL_COMMUNITY): Payer: BC Managed Care – PPO

## 2023-06-23 DIAGNOSIS — K922 Gastrointestinal hemorrhage, unspecified: Secondary | ICD-10-CM | POA: Diagnosis not present

## 2023-06-23 DIAGNOSIS — K31819 Angiodysplasia of stomach and duodenum without bleeding: Secondary | ICD-10-CM | POA: Diagnosis not present

## 2023-06-23 DIAGNOSIS — K2270 Barrett's esophagus without dysplasia: Secondary | ICD-10-CM | POA: Diagnosis not present

## 2023-06-23 DIAGNOSIS — K921 Melena: Secondary | ICD-10-CM | POA: Diagnosis not present

## 2023-06-23 DIAGNOSIS — K5521 Angiodysplasia of colon with hemorrhage: Secondary | ICD-10-CM | POA: Diagnosis not present

## 2023-06-23 DIAGNOSIS — I10 Essential (primary) hypertension: Secondary | ICD-10-CM | POA: Diagnosis not present

## 2023-06-23 DIAGNOSIS — D62 Acute posthemorrhagic anemia: Secondary | ICD-10-CM | POA: Diagnosis not present

## 2023-06-23 DIAGNOSIS — D649 Anemia, unspecified: Secondary | ICD-10-CM | POA: Diagnosis not present

## 2023-06-23 LAB — BASIC METABOLIC PANEL
Anion gap: 8 (ref 5–15)
BUN: 37 mg/dL — ABNORMAL HIGH (ref 8–23)
CO2: 22 mmol/L (ref 22–32)
Calcium: 7 mg/dL — ABNORMAL LOW (ref 8.9–10.3)
Chloride: 104 mmol/L (ref 98–111)
Creatinine, Ser: 1.43 mg/dL — ABNORMAL HIGH (ref 0.61–1.24)
GFR, Estimated: 51 mL/min — ABNORMAL LOW (ref >60)
Glucose, Bld: 170 mg/dL — ABNORMAL HIGH (ref 70–99)
Potassium: 4.5 mmol/L (ref 3.5–5.1)
Sodium: 134 mmol/L — ABNORMAL LOW (ref 135–145)

## 2023-06-23 LAB — CBC
HCT: 19.8 % — ABNORMAL LOW (ref 39.0–52.0)
HCT: 20.4 % — ABNORMAL LOW (ref 39.0–52.0)
HCT: 21.8 % — ABNORMAL LOW (ref 39.0–52.0)
Hemoglobin: 6.4 g/dL — CL (ref 13.0–17.0)
Hemoglobin: 6.4 g/dL — CL (ref 13.0–17.0)
Hemoglobin: 7.2 g/dL — ABNORMAL LOW (ref 13.0–17.0)
MCH: 29.6 pg (ref 26.0–34.0)
MCH: 30.6 pg (ref 26.0–34.0)
MCH: 30.9 pg (ref 26.0–34.0)
MCHC: 31.4 g/dL (ref 30.0–36.0)
MCHC: 32.3 g/dL (ref 30.0–36.0)
MCHC: 33 g/dL (ref 30.0–36.0)
MCV: 93.6 fL (ref 80.0–100.0)
MCV: 94.4 fL (ref 80.0–100.0)
MCV: 94.7 fL (ref 80.0–100.0)
Platelets: 116 10*3/uL — ABNORMAL LOW (ref 150–400)
Platelets: 119 10*3/uL — ABNORMAL LOW (ref 150–400)
Platelets: 134 10*3/uL — ABNORMAL LOW (ref 150–400)
RBC: 2.09 MIL/uL — ABNORMAL LOW (ref 4.22–5.81)
RBC: 2.16 MIL/uL — ABNORMAL LOW (ref 4.22–5.81)
RBC: 2.33 MIL/uL — ABNORMAL LOW (ref 4.22–5.81)
RDW: 19.7 % — ABNORMAL HIGH (ref 11.5–15.5)
RDW: 20.4 % — ABNORMAL HIGH (ref 11.5–15.5)
RDW: 21.1 % — ABNORMAL HIGH (ref 11.5–15.5)
WBC: 10.6 10*3/uL — ABNORMAL HIGH (ref 4.0–10.5)
WBC: 13.2 10*3/uL — ABNORMAL HIGH (ref 4.0–10.5)
WBC: 15.4 10*3/uL — ABNORMAL HIGH (ref 4.0–10.5)
nRBC: 0.9 % — ABNORMAL HIGH (ref 0.0–0.2)
nRBC: 1.4 % — ABNORMAL HIGH (ref 0.0–0.2)
nRBC: 1.7 % — ABNORMAL HIGH (ref 0.0–0.2)

## 2023-06-23 LAB — HEMOGLOBIN AND HEMATOCRIT, BLOOD
HCT: 23.1 % — ABNORMAL LOW (ref 39.0–52.0)
Hemoglobin: 7.5 g/dL — ABNORMAL LOW (ref 13.0–17.0)

## 2023-06-23 LAB — PREPARE RBC (CROSSMATCH)

## 2023-06-23 MED ORDER — SODIUM PERTECHNETATE TC 99M INJECTION
11.0000 | Freq: Once | INTRAVENOUS | Status: AC | PRN
  Administered 2023-06-23: 11 via INTRAVENOUS

## 2023-06-23 MED ORDER — LORAZEPAM 0.5 MG PO TABS: 0.5000 mg | ORAL_TABLET | Freq: Once | ORAL | Status: DC | PRN

## 2023-06-23 MED ORDER — SODIUM CHLORIDE 0.9% IV SOLUTION: Freq: Once | INTRAVENOUS | Status: AC

## 2023-06-23 NOTE — Progress Notes (Signed)
Got a call from lab with HGB 6.4. [name], MD was notified.  Harry Patterson

## 2023-06-23 NOTE — Progress Notes (Signed)
[facility]  PROGRESS NOTE  Harry Patterson. YSA:[id number] DOB: [birth year] DOA: 06/21/2023 PCP: Lynnea Ferrier, MD   Brief HPI:    77 y.o. male with medical history significant of hypertension, neuropathy, hyperlipidemia, alcohol use, B12 deficiency, Barrett's esophagus, BPH, chronic prostatitis presenting with ongoing GI bleed despite interventions at [facility].  Workup at [facility] included EGD with Barrett's esophagus but no source of bleeding. Colonoscopy with hemorrhoids and diverticulosis with no source of bleeding but blood noted in the colon. Capsule endoscopy without obvious source of bleeding. CTA showing small upper bleed. IR intervention but no obvious extravasation so no embolization formed.  Recommended for advanced endoscopy but declined by [facility] or [facility] due to capacity. Accepted by [facility] here for trial of single lumen enteroscopy.    Assessment/Plan:   GI bleed/symptomatic anemia -Initially presented to [facility] with GI bleed; underwent EGD, colonoscopy -EGD only showed Barrett esophagus, no obvious source of bleeding -Colonoscopy on 11/18 showed nonbleeding hemorrhoids, nonbleeding diverticulosis, blood in the colon but no obvious source of bleeding -Capsule endoscopy on 11/19 showed findings of active bleeding but no obvious source -CTA on 11/21 showed small bowel bleed in the upper abdomen; IR attempted intervention but no extravasation from SMA or distal branches was noted and no embolization was performed -Neck step advanced endoscopy, [facility] for at capacity and declined transfer -GI Dr Barron Alvine accepted the patient for trial of single lumen enteroscopy -Underwent enteroscopy today 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  If continues to have bleeding, may need repeat CTA or capsule endoscopy.  For transfer to academic center for further options -Plan  for Meckel scan today; full liquid diet after Meckel scan  Anemia, acute blood loss -Secondary to above -Has been getting blood transfusion 1 to 2 units/day with total of 10 units transfused -Hemoglobin is stable today at 7.2 after 1 unit of PRBC given last night -Continue Protonix, octreotide -Follow CBC in a.m.  Acute kidney injury -Creatinine was elevated 1.3 from baseline 1.0 -Received IV fluids at [facility] -Creatinine is 1.42 today -Follow BMP in am  Hypertension -Continue metoprolol  BPH -Hold home tamsulosin  B12 deficiency -Continue B12 supplementation  Neuropathy -Continue gabapentin  Suprapubic tenderness > Noted at [facility], due to patient being status post cystoscopy with balloon urethral dilation on 11/5.  Medications     sodium chloride   Intravenous Once   ascorbic acid  500 mg Oral QHS   atorvastatin  20 mg Oral QHS   cholecalciferol  2,000 Units Oral QHS   cyanocobalamin  1,000 mcg Oral QHS   folic acid  2 mg Oral q AM   gabapentin  300 mg Oral BID   metoprolol succinate  100 mg Oral Daily   octreotide  100 mcg Subcutaneous Q12H   omega-3 acid ethyl esters  1 g Oral BID   pantoprazole  40 mg Intravenous Q12H   sodium chloride flush  3 mL Intravenous Q12H   tamsulosin  0.4 mg Oral Daily     Data Reviewed:   CBG:  Recent Labs  Lab 06/17/23 0959  GLUCAP 145*    SpO2: 95 % O2 Flow Rate (L/min): 0 L/min    Vitals:   06/23/23 0130 06/23/23 0156 06/23/23 0407 06/23/23 0720  BP: (!) 132/58 (!) 120/58 (!) 128/58 (!) 115/56  Pulse: 67 65 65 (!) 58  Resp: 16 16  16 18  Temp: 98.8 F (37.1 C) 98.9 F (37.2 C) 98.2 F (36.8 C) 98 F (36.7 C)  TempSrc: Oral Oral Oral   SpO2: 93% 91% 93% 95%  Weight:      Height:          Data Reviewed:  Basic Metabolic Panel: Recent Labs  Lab 06/20/23 0631 06/20/23 2238 06/21/23 0255 06/22/23 0326 06/23/23 0716  NA 140 137 139 138 134*  K 4.1 4.2 4.4 4.3 4.5  CL 110 107 108 107 104  CO2 26 23 24  23 22   GLUCOSE 157* 168* 181* 166* 170*  BUN 34* 31* 33* 38* 37*  CREATININE 1.36* 1.14 1.27* 1.42* 1.43*  CALCIUM 7.7* 7.0* 7.2* 7.1* 7.0*  MG  --  2.1  --   --   --     CBC: Recent Labs  Lab 06/19/23 0445 06/19/23 1147 06/20/23 0631 06/20/23 1646 06/20/23 2238 06/21/23 0255 06/21/23 0922 06/21/23 2009 06/22/23 0326 06/22/23 1226 06/22/23 1603 06/23/23 0008 06/23/23 0716 06/23/23 0736  WBC 9.9  --  11.6* 10.8* 13.5* 14.4*  --  14.9*  --  15.3* 16.5* 15.4*  --  13.2*  NEUTROABS 6.9  --  7.7 7.7 9.7* 11.1*  --   --   --   --   --   --   --   --   HGB 6.0*   < > 6.7* 6.1* 7.4* 6.9*   < > 6.3*   < > 7.6* 7.1* 6.4* 7.5* 7.2*  HCT 17.6*   < > 19.6* 18.0* 21.8* 20.3*   < > 20.0*   < > 23.3* 22.1* 19.8* 23.1* 21.8*  MCV 96.2  --  95.1 91.4 90.8 93.1  --  98.5  --  94.0 91.7 94.7  --  93.6  PLT 144*  --  144* 137* 155 148*  --  166  --  141* 124* 134*  --  119*   < > = values in this interval not displayed.    LFT Recent Labs  Lab 06/20/23 2238 06/22/23 0326  AST 13* 14*  ALT 10 8  ALKPHOS 29* 26*  BILITOT 0.8 0.6  PROT 4.1* 3.6*  ALBUMIN 2.3* 2.0*     Antibiotics: Anti-infectives (From admission, onward)    None        DVT prophylaxis: SCDs  Code Status: Full code  Family Communication: Discussed with patient wife at bedside   CONSULTS gastroenterology   Subjective   Had small amount of bloody bowel movement last night.  Hemoglobin dropped to 6.4, required 1 unit PRBC.  Hemoglobin this morning is 7.2   Objective    Physical Examination:  General-appears in no acute distress Heart-S1-S2, regular, no murmur auscultated Lungs-clear to auscultation bilaterally, no wheezing or crackles auscultated Abdomen-soft, nontender, no organomegaly Extremities-no edema in the lower extremities Neuro-alert, oriented x3, no focal deficit noted   Status is: Inpatient:             [name]   [facility] If 7PM-7AM, please contact  night-coverage at [email or web address], Office  [phone]   06/23/2023, 1:24 PM  LOS: 2 days

## 2023-06-23 NOTE — Progress Notes (Signed)
Blood transfusion started at 1810 PM, at the rate of 27ml/hour. RN remains at bed side. No sings of reaction noted. Will continue to monitor.

## 2023-06-23 NOTE — Progress Notes (Signed)
Blood is being transfused. RN remains at bed side. No reaction is noted. Will continue to monitor

## 2023-06-23 NOTE — Progress Notes (Unsigned)
{  Select_TRH_[id number]}

## 2023-06-23 NOTE — Progress Notes (Addendum)
Patient ID: Harry Patterson., male   DOB: 1946/06/30, 77 y.o.   MRN: [id number]     Attending physician's note   I have taken a history, reviewed the chart, and examined the patient. I performed a substantive portion of this encounter, including complete performance of at least one of the key components, in conjunction with the APP. I agree with the APP's note, impression, and recommendations with my edits.   Single balloon enteroscopy completed yesterday and advanced into the mid small bowel without any clear source of bleeding (2 tiny nonbleeding AVMs treated with APC).  No further overt bleeding.  Hemoglobin 6.4 this morning and transfused another unit, now 7.5.  Another in-depth conversation with the patient and his spouse at bedside today regarding his ongoing obscure overt bleed.  Again suspect mid to distal small bowel bleed based on VCE and CTA conducted at [facility].  There was question of a secondary lesion in the distal small bowel.  Can pursue with Meckel scan.  - Continue octreotide - Continue serial CBC checks with additional blood products as needed per protocol - If acute rebleeding, plan for CTA - Briefly discussed with surgical service.  Likely no role for surgery since this is a mucosal-based source of bleeding - GI service will continue to follow  Doristine Locks, DO, FACG [phone] office          Progress Note   Subjective   Day # 2 CC; transferred from [facility] with persistent acute GI bleed  Balloon enteroscopy yesterday-Barrett's esophagus noted, benign fundic gland polyps.  2 tiny nonbleeding AVMs in the duodenum treated with APC not felt to be the source of his recurrent bleeding, the remainder of the small bowel to the distal jejunum was normal, tattoo placed at distal extent reached.  Not had any bleeding since the procedure yesterday, no bowel movements no complaints of abdominal pain, no nausea, hoping to have something to  eat  Hemoglobin 7.5/hematocrit 23.1-transfusion last p.m. for hemoglobin 6.4 -has had a total of about 14 units of packed RBCs BUN 37/creatinine 1.4   Objective   Vital signs in last 24 hours: Temp:  [97.5 F (36.4 C)-98.9 F (37.2 C)] 98 F (36.7 C) (11/23 0720) Pulse Rate:  [58-93] 58 (11/23 0720) Resp:  [11-18] 18 (11/23 0720) BP: (111-146)/(56-68) 115/56 (11/23 0720) SpO2:  [90 %-95 %] 95 % (11/23 0720) Last BM Date : 06/22/23 General:    Elderly white male in NAD pleasant, wife at bedside Heart:  Regular rate and rhythm; no murmurs Lungs: Respirations even and unlabored, lungs CTA bilaterally Abdomen:  Soft, nontender and nondistended. Normal bowel sounds. Extremities:  Without edema. Neurologic:  Alert and oriented,  grossly normal neurologically. Psych:  Cooperative. Normal mood and affect.  Intake/Output from previous day: 11/22 0701 - 11/23 0700 In: 1144.3 [I.V.:507.3; Blood:637] Out: 900 [Urine:900] Intake/Output this shift: Total I/O In: 100 [P.O.:100] Out: 100 [Urine:100]  Lab Results: Recent Labs    06/22/23 1603 06/23/23 0008 06/23/23 0716 06/23/23 0736  WBC 16.5* 15.4*  --  13.2*  HGB 7.1* 6.4* 7.5* 7.2*  HCT 22.1* 19.8* 23.1* 21.8*  PLT 124* 134*  --  119*   BMET Recent Labs    06/21/23 0255 06/22/23 0326 06/23/23 0716  NA 139 138 134*  K 4.4 4.3 4.5  CL 108 107 104  CO2 24 23 22   GLUCOSE 181* 166* 170*  BUN 33* 38* 37*  CREATININE 1.27* 1.42* 1.43*  CALCIUM 7.2* 7.1* 7.0*  LFT Recent Labs    06/20/23 2238 06/22/23 0326  PROT 4.1* 3.6*  ALBUMIN 2.3* 2.0*  AST 13* 14*  ALT 10 8  ALKPHOS 29* 26*  BILITOT 0.8 0.6  BILIDIR 0.1  --   IBILI 0.7  --    PT/INR Recent Labs    06/20/23 2238  LABPROT 15.0  INR 1.2        Assessment / Plan:    #26 77 year old white male who presented to [facility] with acute GI bleeding. He had undergone EGD and colonoscopy with no source for bleeding found. Capsule endoscopy was  done which showed active brisk bleeding and what appeared to be the mid small bowel and there was also question of additional bleeding site in the distal small bowel within a couple of minutes of the cecum.  CTA there showed active bleeding mid small bowel in the right upper quadrant but subsequent angiography was then negative  He required multiple units of packed RBCs.  Transferred here for single balloon enteroscopy which was done yesterday and unfortunately with no source found, felt to be distal to the extent reached with single balloon.  He did have 2 tiny proximal duodenal AVMs which were treated with APC  No further bleeding since the procedure yesterday He did require additional transfusion last p.m. for hemoglobin 6.4 and is stable at 7.2 this a.m.  Plan; patient is at [facility] had reached out to [facility] and [facility] regarding double-balloon enteroscopy and both facilities at capacity  Will pursue Meckel scan today concern for very distal small bowel active bleed  Continue to support with transfusions as indicated for hemoglobin 7 or less  Continue subcu octreotide  Full liquid diet after Meckel scan.  Will follow closely with you    Principal Problem:   GI bleed Active Problems:   Benign prostatic hyperplasia without lower urinary tract symptoms   B12 deficiency   Chronic prostatitis   Essential hypertension   Barrett's esophagus without dysplasia   Polyneuropathy   Pure hypercholesterolemia   Upper GI bleed   Symptomatic anemia   AVM (arteriovenous malformation) of small bowel, acquired with hemorrhage   ABLA (acute blood loss anemia)     LOS: 2 days   Amy Esterwood PA-C  06/23/2023, 10:43 AM

## 2023-06-23 NOTE — Plan of Care (Signed)
  Problem: Education: Goal: Knowledge of General Education information will improve Description: Including pain rating scale, medication(s)/side effects and non-pharmacologic comfort measures Outcome: Progressing   Problem: Health Behavior/Discharge Planning: Goal: Ability to manage health-related needs will improve Outcome: Progressing   Problem: Clinical Measurements: Goal: Respiratory complications will improve Outcome: Progressing   Problem: Elimination: Goal: Will not experience complications related to urinary retention Outcome: Progressing   Problem: Pain Management: Goal: General experience of comfort will improve Outcome: Progressing   Problem: Safety: Goal: Ability to remain free from injury will improve Outcome: Progressing   Problem: Skin Integrity: Goal: Risk for impaired skin integrity will decrease Outcome: Progressing

## 2023-06-24 ENCOUNTER — Encounter (HOSPITAL_COMMUNITY)
Admission: AD | Disposition: A | Discharge status: Home Health | Payer: Self-pay | Source: Other Acute Inpatient Hospital | Attending: Family Medicine

## 2023-06-24 ENCOUNTER — Encounter (HOSPITAL_COMMUNITY): Payer: Self-pay | Admitting: Internal Medicine

## 2023-06-24 DIAGNOSIS — K922 Gastrointestinal hemorrhage, unspecified: Secondary | ICD-10-CM | POA: Diagnosis not present

## 2023-06-24 DIAGNOSIS — K5521 Angiodysplasia of colon with hemorrhage: Secondary | ICD-10-CM | POA: Diagnosis not present

## 2023-06-24 DIAGNOSIS — K31819 Angiodysplasia of stomach and duodenum without bleeding: Secondary | ICD-10-CM | POA: Diagnosis not present

## 2023-06-24 DIAGNOSIS — D62 Acute posthemorrhagic anemia: Secondary | ICD-10-CM | POA: Diagnosis not present

## 2023-06-24 DIAGNOSIS — K921 Melena: Secondary | ICD-10-CM | POA: Diagnosis not present

## 2023-06-24 DIAGNOSIS — K2270 Barrett's esophagus without dysplasia: Secondary | ICD-10-CM | POA: Diagnosis not present

## 2023-06-24 HISTORY — PX: GIVENS CAPSULE STUDY: SHX5432

## 2023-06-24 LAB — TYPE AND SCREEN
ABO/RH(D): A POS
Antibody Screen: NEGATIVE
Unit division: 0
Unit division: 0
Unit division: 0
Unit division: 0

## 2023-06-24 LAB — BASIC METABOLIC PANEL
Anion gap: 7 (ref 5–15)
BUN: 33 mg/dL — ABNORMAL HIGH (ref 8–23)
CO2: 23 mmol/L (ref 22–32)
Calcium: 7 mg/dL — ABNORMAL LOW (ref 8.9–10.3)
Chloride: 103 mmol/L (ref 98–111)
Creatinine, Ser: 1.37 mg/dL — ABNORMAL HIGH (ref 0.61–1.24)
GFR, Estimated: 53 mL/min — ABNORMAL LOW (ref >60)
Glucose, Bld: 118 mg/dL — ABNORMAL HIGH (ref 70–99)
Potassium: 3.9 mmol/L (ref 3.5–5.1)
Sodium: 133 mmol/L — ABNORMAL LOW (ref 135–145)

## 2023-06-24 LAB — BPAM RBC
Blood Product Expiration Date: 202412192359
Blood Product Expiration Date: 202412132359
Blood Product Expiration Date: 202412142359
Blood Product Expiration Date: 202412192359
ISSUE DATE / TIME: 202411212217
ISSUE DATE / TIME: 202411231803
ISSUE DATE / TIME: 202411220802
ISSUE DATE / TIME: 202411230130
Unit Type and Rh: 6200
Unit Type and Rh: 6200
Unit Type and Rh: 6200
Unit Type and Rh: 6200

## 2023-06-24 LAB — CBC WITH DIFFERENTIAL/PLATELET
Abs Immature Granulocytes: 0.08 10*3/uL — ABNORMAL HIGH (ref 0.00–0.07)
Basophils Absolute: 0 10*3/uL (ref 0.0–0.1)
Basophils Relative: 0 %
Eosinophils Absolute: 0.3 10*3/uL (ref 0.0–0.5)
Eosinophils Relative: 4 %
HCT: 22.2 % — ABNORMAL LOW (ref 39.0–52.0)
Hemoglobin: 7.1 g/dL — ABNORMAL LOW (ref 13.0–17.0)
Immature Granulocytes: 1 %
Lymphocytes Relative: 14 %
Lymphs Abs: 1 10*3/uL (ref 0.7–4.0)
MCH: 29.8 pg (ref 26.0–34.0)
MCHC: 32 g/dL (ref 30.0–36.0)
MCV: 93.3 fL (ref 80.0–100.0)
Monocytes Absolute: 0.7 10*3/uL (ref 0.1–1.0)
Monocytes Relative: 9 %
Neutro Abs: 5.4 10*3/uL (ref 1.7–7.7)
Neutrophils Relative %: 72 %
Platelets: 115 10*3/uL — ABNORMAL LOW (ref 150–400)
RBC: 2.38 MIL/uL — ABNORMAL LOW (ref 4.22–5.81)
RDW: 20.2 % — ABNORMAL HIGH (ref 11.5–15.5)
WBC: 7.5 10*3/uL (ref 4.0–10.5)
nRBC: 0.7 % — ABNORMAL HIGH (ref 0.0–0.2)

## 2023-06-24 LAB — CBC
HCT: 22.6 % — ABNORMAL LOW (ref 39.0–52.0)
Hemoglobin: 7.3 g/dL — ABNORMAL LOW (ref 13.0–17.0)
MCH: 29.8 pg (ref 26.0–34.0)
MCHC: 32.3 g/dL (ref 30.0–36.0)
MCV: 92.2 fL (ref 80.0–100.0)
Platelets: 114 10*3/uL — ABNORMAL LOW (ref 150–400)
RBC: 2.45 MIL/uL — ABNORMAL LOW (ref 4.22–5.81)
RDW: 19.5 % — ABNORMAL HIGH (ref 11.5–15.5)
WBC: 9.6 10*3/uL (ref 4.0–10.5)
nRBC: 1.1 % — ABNORMAL HIGH (ref 0.0–0.2)

## 2023-06-24 LAB — HEMOGLOBIN AND HEMATOCRIT, BLOOD
HCT: 22.4 % — ABNORMAL LOW (ref 39.0–52.0)
Hemoglobin: 7.2 g/dL — ABNORMAL LOW (ref 13.0–17.0)

## 2023-06-24 SURGERY — IMAGING PROCEDURE, GI TRACT, INTRALUMINAL, VIA CAPSULE

## 2023-06-24 MED ORDER — METOPROLOL SUCCINATE ER 50 MG PO TB24: 50.0000 mg | ORAL_TABLET | Freq: Every day | ORAL | Status: DC

## 2023-06-24 NOTE — Progress Notes (Signed)
[facility]  PROGRESS NOTE  Harry Patterson. ZOX:[id number] DOB: 11/30/1945 DOA: 06/21/2023 PCP: Lynnea Ferrier, MD   Brief HPI:    77 y.o. male with medical history significant of hypertension, neuropathy, hyperlipidemia, alcohol use, B12 deficiency, Barrett's esophagus, BPH, chronic prostatitis presenting with ongoing GI bleed despite interventions at [facility].  Workup at [facility] included EGD with Barrett's esophagus but no source of bleeding. Colonoscopy with hemorrhoids and diverticulosis with no source of bleeding but blood noted in the colon. Capsule endoscopy without obvious source of bleeding. CTA showing small upper bleed. IR intervention but no obvious extravasation so no embolization formed.  Recommended for advanced endoscopy but declined by [facility] or [facility] due to capacity. Accepted by Cirigliano here for trial of single lumen enteroscopy.    Assessment/Plan:   GI bleed/symptomatic anemia -Initially presented to [facility] with GI bleed; underwent EGD, colonoscopy -EGD only showed Barrett esophagus, no obvious source of bleeding -Colonoscopy on 11/18 showed nonbleeding hemorrhoids, nonbleeding diverticulosis, blood in the colon but no obvious source of bleeding -Capsule endoscopy on 11/19 showed findings of active bleeding but no obvious source -CTA on 11/21 showed small bowel bleed in the upper abdomen; IR attempted intervention but no extravasation from SMA or distal branches was noted and no embolization was performed -Neck step advanced endoscopy, [facility] and [facility] for at capacity and declined transfer -GI Dr Barron Alvine accepted the patient for trial of single lumen enteroscopy -Underwent enteroscopy today 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  If continues to have bleeding, may need repeat CTA or capsule endoscopy.  For transfer to academic center for further  options -Underwent nuclear meckel scan yesterday, which was negative -Plan for repeat capsule endoscopy today  Anemia, acute blood loss -Secondary to above -Has been getting blood transfusion 1 to 2 units/day with total of 11 units transfused -Hemoglobin is 7.1 today -Continue Protonix, octreotide -Follow CBC in a.m.  Acute kidney injury -Creatinine was elevated 1.3 from baseline 1.0 -Received IV fluids at [facility] -Creatinine is 1.37 today -Follow BMP in am  Hypertension -Continue metoprolol -We will cut down the dose of metoprolol to 50 mg p.o. daily due to hypotension and bradycardia  BPH -Hold home tamsulosin  B12 deficiency -Continue B12 supplementation  Neuropathy -Continue gabapentin  Suprapubic tenderness > Noted at [facility], due to patient being status post cystoscopy with balloon urethral dilation on 11/5.  Medications     sodium chloride   Intravenous Once   ascorbic acid  500 mg Oral QHS   atorvastatin  20 mg Oral QHS   cholecalciferol  2,000 Units Oral QHS   cyanocobalamin  1,000 mcg Oral QHS   folic acid  2 mg Oral q AM   gabapentin  300 mg Oral BID   metoprolol succinate  100 mg Oral Daily   octreotide  100 mcg Subcutaneous Q12H   omega-3 acid ethyl esters  1 g Oral BID   pantoprazole  40 mg Intravenous Q12H   sodium chloride flush  3 mL Intravenous Q12H   tamsulosin  0.4 mg Oral Daily     Data Reviewed:   CBG:  Recent Labs  Lab 06/17/23 0959  GLUCAP 145*    SpO2: 94 % O2 Flow Rate (L/min): 0 L/min    Vitals:   06/23/23 2047 06/23/23 2313 06/24/23 0535 06/24/23 0735  BP: 112/62 (!) 117/58 (!) 122/55 (!) 112/56  Pulse: (!) 54 (!) 50 (!) 56 (!) 53  Resp: 17 16 16 18   Temp: 98 F (36.7 C) 98.1 F (36.7 C)  98 F (36.7 C)  TempSrc: Oral     SpO2: 94% 96% 94% 94%  Weight:      Height:          Data Reviewed:  Basic Metabolic Panel: Recent Labs  Lab 06/20/23 2238 06/21/23 0255 06/22/23 0326 06/23/23 0716 06/23/23 2333  NA  137 139 138 134* 133*  K 4.2 4.4 4.3 4.5 3.9  CL 107 108 107 104 103  CO2 23 24 23 22 23   GLUCOSE 168* 181* 166* 170* 118*  BUN 31* 33* 38* 37* 33*  CREATININE 1.14 1.27* 1.42* 1.43* 1.37*  CALCIUM 7.0* 7.2* 7.1* 7.0* 7.0*  MG 2.1  --   --   --   --     CBC: Recent Labs  Lab 06/19/23 0445 06/19/23 1147 06/20/23 0631 06/20/23 1646 06/20/23 2238 06/21/23 0255 06/21/23 0922 06/22/23 1603 06/23/23 0008 06/23/23 0716 06/23/23 0736 06/23/23 1655 06/23/23 2333  WBC 9.9  --  11.6* 10.8* 13.5* 14.4*   < > 16.5* 15.4*  --  13.2* 10.6* 9.6  NEUTROABS 6.9  --  7.7 7.7 9.7* 11.1*  --   --   --   --   --   --   --   HGB 6.0*   < > 6.7* 6.1* 7.4* 6.9*   < > 7.1* 6.4* 7.5* 7.2* 6.4* 7.3*  HCT 17.6*   < > 19.6* 18.0* 21.8* 20.3*   < > 22.1* 19.8* 23.1* 21.8* 20.4* 22.6*  MCV 96.2  --  95.1 91.4 90.8 93.1   < > 91.7 94.7  --  93.6 94.4 92.2  PLT 144*  --  144* 137* 155 148*   < > 124* 134*  --  119* 116* 114*   < > = values in this interval not displayed.    LFT Recent Labs  Lab 06/20/23 2238 06/22/23 0326  AST 13* 14*  ALT 10 8  ALKPHOS 29* 26*  BILITOT 0.8 0.6  PROT 4.1* 3.6*  ALBUMIN 2.3* 2.0*     Antibiotics: Anti-infectives (From admission, onward)    None        DVT prophylaxis: SCDs  Code Status: Full code  Family Communication: Discussed with patient wife at bedside   CONSULTS gastroenterology   Subjective   Feels better this morning.  Denies abdominal pain.  Hemoglobin 7.1 this morning.  No further bloody bowel movements for  past 24 hours.   Objective    Physical Examination:   General-appears in no acute distress Heart-S1-S2, regular, no murmur auscultated Lungs-clear to auscultation bilaterally, no wheezing or crackles auscultated Abdomen-soft, nontender, no organomegaly Extremities-no edema in the lower extremities Neuro-alert, oriented x3, no focal deficit noted  Status is: Inpatient:             [name]   [facility] If 7PM-7AM, please contact night-coverage at [email or web address], Office  [phone]   06/24/2023, 8:52 AM  LOS: 3 days

## 2023-06-24 NOTE — Progress Notes (Addendum)
Patient ID: Harry Patterson., male   DOB: 1946-06-10, 77 y.o.   MRN: [id number]     Attending physician's note   I have taken a history, reviewed the chart, and examined the patient. I performed a substantive portion of this encounter, including complete performance of at least one of the key components, in conjunction with the APP. I agree with the APP's note, impression, and recommendations with my edits.  No further bleeding overnight.  Completed Meckel scan which was negative/normal.  H/H again down last evening to 6.4/20, transfused 1 more unit, with posttransfusion repeat 7.3/23 then 7.1/22.2 this morning (15 total units transfused on this admission).  Discussed with patient and his spouse at bedside today.  Does not appear to be having any ongoing overt bleeding, but still requiring RBC transfusions.  Plan for the following:  - VCE repeat today.  If no blood in the GI lumen obscuring images, this may hopefully be able to give [place] the source of recent bleeding?  If located, may also be able to narrow location within the small bowel based on the previously placed tattoo during single balloon enteroscopy. - Continue serial CBC checks with additional blood products as needed per protocol - N.p.o. now.  Can start clear liquids 2 hours after capsule placement, then full liquids at 4 hours and slowly advance as tolerated after that - If overt bleeding, plan still for repeat CTA - Dr. Leonides Schanz will assume his ongoing inpatient GI care starting tomorrow morning  Vito Cirigliano, DO, FACG [phone] office         Progress Note   Subjective   Day # 3 CC; transfer from [facility] with persistent acute GI bleed  Single balloon enteroscopy here 2 tiny nonbleeding AVMs in the duodenum treated with APC/not felt to be the source of his recurrent bleeding remainder of the small bowel to the distal jejunum normal tattoo placed at distal extent reached  Meckel scan yesterday  negative  Hemoglobin 7.2 today/down to 6.4 this p.m., transfused 1 unit> 7.3> follow-up pending this a.m.-status post total of 15 units since onset  Patient feels good today, no abdominal discomfort, no sense of need for bowel movement, has not passed any bowel movement or stool since yesterday Hoping for increasing diet    Objective   Vital signs in last 24 hours: Temp:  [97.4 F (36.3 C)-98.3 F (36.8 C)] 98 F (36.7 C) (11/24 0735) Pulse Rate:  [50-61] 53 (11/24 0735) Resp:  [16-18] 18 (11/24 0735) BP: (105-122)/(52-62) 112/56 (11/24 0735) SpO2:  [92 %-96 %] 94 % (11/24 0735) Last BM Date : 06/22/23 General:    Elderly white male in NAD Heart:  Regular rate and rhythm; no murmurs Lungs: Respirations even and unlabored, lungs CTA bilaterally Abdomen:  Soft, nontender and nondistended. Normal bowel sounds. Extremities:  Without edema. Neurologic:  Alert and oriented,  grossly normal neurologically. Psych:  Cooperative. Normal mood and affect.  Intake/Output from previous day: 11/23 0701 - 11/24 0700 In: 293.8 [P.O.:100; Blood:193.8] Out: 975 [Urine:975] Intake/Output this shift: No intake/output data recorded.  Lab Results: Recent Labs    06/23/23 0736 06/23/23 1655 06/23/23 2333  WBC 13.2* 10.6* 9.6  HGB 7.2* 6.4* 7.3*  HCT 21.8* 20.4* 22.6*  PLT 119* 116* 114*   BMET Recent Labs    06/22/23 0326 06/23/23 0716 06/23/23 2333  NA 138 134* 133*  K 4.3 4.5 3.9  CL 107 104 103  CO2 23 22 23   GLUCOSE 166* 170*  118*  BUN 38* 37* 33*  CREATININE 1.42* 1.43* 1.37*  CALCIUM 7.1* 7.0* 7.0*   LFT Recent Labs    06/22/23 0326  PROT 3.6*  ALBUMIN 2.0*  AST 14*  ALT 8  ALKPHOS 26*  BILITOT 0.6   PT/INR No results for input(s): "LABPROT", "INR" in the last 72 hours.  Studies/Results: NM Bowel Img Meckels  Result Date: 06/23/2023 CLINICAL DATA:  Acute gastrointestinal hemorrhage the EXAM: NUCLEAR MEDICINE BOWEL (MECKEL'S) SCAN TECHNIQUE: Sequential  abdominal images were obtained following intravenous injection of radiopharmaceutical. RADIOPHARMACEUTICALS:  11.0 mCi Tc-66m pertechnetate IV COMPARISON:  None available. Findings are correlated with prior CT arteriogram of 06/20/2023 FINDINGS: Expected accumulation of radiotracer within the gastric lumen and progressively within the bladder lumen over the course of the examination. No focal uptake of radiotracer to suggest ectopic gastric mucosa within the abdomen. IMPRESSION: No ectopic gastric mucosa identified; negative examination Electronically Signed   By: Helyn Numbers M.D.   On: 06/23/2023 19:12       Assessment / Plan:    #27 77 year old white male with acute major GI bleed now status post 15 units of packed RBCs EGD and colonoscopy without source of bleeding found/done at [facility] prior to transfer Capsule endoscopy prior to transfer showed brisk active bleeding and what appeared to be the mid small bowel, and there was also question of additional bleeding site in the distal small bowel within a couple of minutes of the cecum  Transferred here for single balloon Single balloon enteroscopy 06/22/2023, no source found, no active bleeding, did have 2 tiny proximal duodenal AVMs treated with APC  Meckel's scan yesterday negative  Continuing SQ Octreotide  He has not had any further bleeding over the past 24 hours, did get another unit of blood yesterday and hemoglobin now 7.1  #2 history of B12 deficiency #3.  History of hypertension #4.  Barrett's esophagus #5.  Osteoarthritis-had been on Celebrex chronically, prior to admission , his wife had stopped that a couple of weeks ago  Plan; discuss situation further with Dr. Barron Alvine, will proceed with repeat capsule endoscopy, hopefully this can be placed today-to reassess small bowel in setting of less active bleeding in hopes to see a culprit lesion Advance to full liquid diet after capsule placed Off Celebrex and stay off Celebrex  indefinitely Continue to monitor serial hemoglobins and transfuse as indicated   Principal Problem:   GI bleed Active Problems:   Benign prostatic hyperplasia without lower urinary tract symptoms   B12 deficiency   Chronic prostatitis   Essential hypertension   Barrett's esophagus without dysplasia   Polyneuropathy   Pure hypercholesterolemia   Upper GI bleed   Symptomatic anemia   AVM (arteriovenous malformation) of small bowel, acquired with hemorrhage   ABLA (acute blood loss anemia)     LOS: 3 days   Kolby Myung EsterwoodPA-c  06/24/2023, 8:38 AM

## 2023-06-25 ENCOUNTER — Encounter (HOSPITAL_COMMUNITY): Payer: Self-pay | Admitting: Gastroenterology

## 2023-06-25 DIAGNOSIS — N400 Enlarged prostate without lower urinary tract symptoms: Secondary | ICD-10-CM | POA: Diagnosis not present

## 2023-06-25 DIAGNOSIS — K2270 Barrett's esophagus without dysplasia: Secondary | ICD-10-CM | POA: Diagnosis not present

## 2023-06-25 DIAGNOSIS — K5521 Angiodysplasia of colon with hemorrhage: Secondary | ICD-10-CM | POA: Diagnosis not present

## 2023-06-25 DIAGNOSIS — D62 Acute posthemorrhagic anemia: Secondary | ICD-10-CM | POA: Diagnosis not present

## 2023-06-25 DIAGNOSIS — K922 Gastrointestinal hemorrhage, unspecified: Secondary | ICD-10-CM | POA: Diagnosis not present

## 2023-06-25 DIAGNOSIS — D649 Anemia, unspecified: Secondary | ICD-10-CM | POA: Diagnosis not present

## 2023-06-25 LAB — HEMOGLOBIN AND HEMATOCRIT, BLOOD
HCT: 20.7 % — ABNORMAL LOW (ref 39.0–52.0)
HCT: 22.9 % — ABNORMAL LOW (ref 39.0–52.0)
HCT: 26.8 % — ABNORMAL LOW (ref 39.0–52.0)
Hemoglobin: 6.4 g/dL — CL (ref 13.0–17.0)
Hemoglobin: 7.2 g/dL — ABNORMAL LOW (ref 13.0–17.0)
Hemoglobin: 8.4 g/dL — ABNORMAL LOW (ref 13.0–17.0)

## 2023-06-25 LAB — PREPARE RBC (CROSSMATCH)

## 2023-06-25 MED ORDER — SODIUM CHLORIDE 0.9% IV SOLUTION: Freq: Once | INTRAVENOUS | Status: AC

## 2023-06-25 MED ORDER — METOPROLOL TARTRATE 12.5 MG HALF TABLET
12.5000 mg | ORAL_TABLET | Freq: Two times a day (BID) | ORAL | Status: DC
  Administered 2023-06-25 – 2023-06-29 (×8): 12.5 mg via ORAL
  Filled 2023-06-25 (×8): qty 1

## 2023-06-25 NOTE — Progress Notes (Addendum)
Brief GI Progress Note:  Originally it seemed that the patient's hemoglobin had dropped slightly but on recheck it seems his Hb is actually stable. Patient denies any blood in his stools. His last episode of bleeding was 3 days ago. Unfortunately his VCE 06/24/23 does not show any evidence of the capsule leaving the stomach. Fundic gland polyps can still be visualized at the end of the VCE study. Patient may need an EGD to drop the patient's next capsule. Scheduling availability is limited but will make NPO after MN in case this can be done tomorrow.

## 2023-06-25 NOTE — Progress Notes (Signed)
Repeat hemoglobin is 7.2, per MD order hold blood transfusion at this time.

## 2023-06-25 NOTE — Progress Notes (Signed)
[facility]  PROGRESS NOTE  Harry Patterson. QVZ:[id number] DOB: 05-25-1946 DOA: 06/21/2023 PCP: Lynnea Ferrier, MD   Brief HPI:    77 y.o. male with medical history significant of hypertension, neuropathy, hyperlipidemia, alcohol use, B12 deficiency, Barrett's esophagus, BPH, chronic prostatitis presenting with ongoing GI bleed despite interventions at [facility].  Workup at [facility] included EGD with Barrett's esophagus but no source of bleeding. Colonoscopy with hemorrhoids and diverticulosis with no source of bleeding but blood noted in the colon. Capsule endoscopy without obvious source of bleeding. CTA showing small upper bleed. IR intervention but no obvious extravasation so no embolization formed.  Recommended for advanced endoscopy but declined by [facility] or [facility] due to capacity. Accepted by [facility] here for trial of single lumen enteroscopy.    Assessment/Plan:   GI bleed/symptomatic anemia -Initially presented to [facility] with GI bleed; underwent EGD, colonoscopy -EGD only showed Barrett esophagus, no obvious source of bleeding -Colonoscopy on 11/18 showed nonbleeding hemorrhoids, nonbleeding diverticulosis, blood in the colon but no obvious source of bleeding -Capsule endoscopy on 11/19 showed findings of active bleeding but no obvious source -CTA on 11/21 showed small bowel bleed in the upper abdomen; IR attempted intervention but no extravasation from SMA or distal branches was noted and no embolization was performed -Neck step advanced endoscopy, [facility] and [facility] for at capacity and declined transfer -GI Dr Barron Alvine accepted the patient for trial of single lumen enteroscopy -Underwent enteroscopy today 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  If continues to have bleeding, may need repeat CTA or capsule endoscopy.  For transfer to academic center for further  options -Underwent nuclear meckel scan yesterday, which was negative -Video capsule endoscopy testing being performed.  Anemia, acute blood loss -Secondary to above -Has been getting blood transfusion 1 to 2 units/day with total of 11 units transfused -Hemoglobin is 7.2 today -Continue Protonix, octreotide -Follow CBC in a.m.  Acute kidney injury -Creatinine was elevated 1.3 from baseline 1.0 -Received IV fluids at [facility] -Creatinine is 1.37 today -Follow BMP in am  Hypertension -Continue metoprolol -Due to soft blood pressure  we will cut down the dose of metoprolol to 12.5 mg p.o. twice daily.  History of SVT -Metoprolol was held and patient developed SVT -Metoprolol was restarted but due to soft blood pressure dose of metoprolol will be changed to 12.5 mg p.o. twice daily   BPH -Hold home tamsulosin  B12 deficiency -Continue B12 supplementation  Neuropathy -Continue gabapentin  Suprapubic tenderness > Noted at [facility], due to patient being status post cystoscopy with balloon urethral dilation on 11/5.  Medications     sodium chloride   Intravenous Once   ascorbic acid  500 mg Oral QHS   atorvastatin  20 mg Oral QHS   cholecalciferol  2,000 Units Oral QHS   cyanocobalamin  1,000 mcg Oral QHS   folic acid  2 mg Oral q AM   gabapentin  300 mg Oral BID   metoprolol tartrate  12.5 mg Oral BID   octreotide  100 mcg Subcutaneous Q12H   omega-3 acid ethyl esters  1 g Oral BID   pantoprazole  40 mg Intravenous Q12H   sodium chloride flush  3 mL Intravenous Q12H   tamsulosin  0.4 mg Oral Daily     Data Reviewed:   CBG:  No results for input(s): "GLUCAP" in the last 168 hours.  SpO2: 94 % O2 Flow Rate (L/min): 0 L/min    Vitals:   06/24/23 2327 06/25/23 0357 06/25/23 0753 06/25/23 0937  BP: (!) 110/56 (!) 102/37 (!) 107/59 109/62  Pulse: 60 (!) 54 (!) 54 62  Resp:  16    Temp: 98.3 F (36.8 C)  98.3 F (36.8 C)   TempSrc: Oral     SpO2: 96% 93% 93% 94%   Weight:      Height:          Data Reviewed:  Basic Metabolic Panel: Recent Labs  Lab 06/20/23 2238 06/21/23 0255 06/22/23 0326 06/23/23 0716 06/23/23 2333  NA 137 139 138 134* 133*  K 4.2 4.4 4.3 4.5 3.9  CL 107 108 107 104 103  CO2 23 24 23 22 23   GLUCOSE 168* 181* 166* 170* 118*  BUN 31* 33* 38* 37* 33*  CREATININE 1.14 1.27* 1.42* 1.43* 1.37*  CALCIUM 7.0* 7.2* 7.1* 7.0* 7.0*  MG 2.1  --   --   --   --     CBC: Recent Labs  Lab 06/20/23 0631 06/20/23 1646 06/20/23 2238 06/21/23 0255 06/21/23 0922 06/23/23 0008 06/23/23 0716 06/23/23 0736 06/23/23 1655 06/23/23 2333 06/24/23 0845 06/24/23 1840 06/25/23 0520 06/25/23 1034  WBC 11.6* 10.8* 13.5* 14.4*   < > 15.4*  --  13.2* 10.6* 9.6 7.5  --   --   --   NEUTROABS 7.7 7.7 9.7* 11.1*  --   --   --   --   --   --  5.4  --   --   --   HGB 6.7* 6.1* 7.4* 6.9*   < > 6.4*   < > 7.2* 6.4* 7.3* 7.1* 7.2* 6.4* 7.2*  HCT 19.6* 18.0* 21.8* 20.3*   < > 19.8*   < > 21.8* 20.4* 22.6* 22.2* 22.4* 20.7* 22.9*  MCV 95.1 91.4 90.8 93.1   < > 94.7  --  93.6 94.4 92.2 93.3  --   --   --   PLT 144* 137* 155 148*   < > 134*  --  119* 116* 114* 115*  --   --   --    < > = values in this interval not displayed.    LFT Recent Labs  Lab 06/20/23 2238 06/22/23 0326  AST 13* 14*  ALT 10 8  ALKPHOS 29* 26*  BILITOT 0.8 0.6  PROT 4.1* 3.6*  ALBUMIN 2.3* 2.0*     Antibiotics: Anti-infectives (From admission, onward)    None        DVT prophylaxis: SCDs  Code Status: Full code  Family Communication: Discussed with patient wife at bedside   CONSULTS gastroenterology   Subjective   Denies bloody bowel movement.  Initial hemoglobin this morning was 6.4, repeat hemoglobin 7.2.  Video capsule endoscopy underway.  Objective    Physical Examination:  General-appears in no acute distress Heart-S1-S2, regular, no murmur auscultated Lungs-clear to auscultation bilaterally, no wheezing or crackles  auscultated Abdomen-soft, nontender, no organomegaly Extremities-no edema in the lower extremities Neuro-alert, oriented x3, no focal deficit noted   Status is: Inpatient:             [name]   [facility] If 7PM-7AM, please contact night-coverage at [email or web address], Office  [phone]   06/25/2023, 12:09 PM  LOS: 4 days

## 2023-06-25 NOTE — Plan of Care (Signed)
  Problem: Education: Goal: Knowledge of General Education information will improve Description Including pain rating scale, medication(s)/side effects and non-pharmacologic comfort measures Outcome: Progressing   

## 2023-06-26 ENCOUNTER — Inpatient Hospital Stay (HOSPITAL_COMMUNITY): Payer: BC Managed Care – PPO

## 2023-06-26 DIAGNOSIS — K922 Gastrointestinal hemorrhage, unspecified: Secondary | ICD-10-CM | POA: Diagnosis not present

## 2023-06-26 DIAGNOSIS — K5521 Angiodysplasia of colon with hemorrhage: Secondary | ICD-10-CM | POA: Diagnosis not present

## 2023-06-26 DIAGNOSIS — E538 Deficiency of other specified B group vitamins: Secondary | ICD-10-CM | POA: Diagnosis not present

## 2023-06-26 DIAGNOSIS — D649 Anemia, unspecified: Secondary | ICD-10-CM | POA: Diagnosis not present

## 2023-06-26 LAB — BASIC METABOLIC PANEL
Anion gap: 6 (ref 5–15)
BUN: 12 mg/dL (ref 8–23)
CO2: 25 mmol/L (ref 22–32)
Calcium: 7.7 mg/dL — ABNORMAL LOW (ref 8.9–10.3)
Chloride: 107 mmol/L (ref 98–111)
Creatinine, Ser: 1.2 mg/dL (ref 0.61–1.24)
GFR, Estimated: >60 mL/min (ref >60)
Glucose, Bld: 164 mg/dL — ABNORMAL HIGH (ref 70–99)
Potassium: 4 mmol/L (ref 3.5–5.1)
Sodium: 138 mmol/L (ref 135–145)

## 2023-06-26 LAB — CBC
HCT: 23 % — ABNORMAL LOW (ref 39.0–52.0)
Hemoglobin: 7.2 g/dL — ABNORMAL LOW (ref 13.0–17.0)
MCH: 29.6 pg (ref 26.0–34.0)
MCHC: 31.3 g/dL (ref 30.0–36.0)
MCV: 94.7 fL (ref 80.0–100.0)
Platelets: 151 10*3/uL (ref 150–400)
RBC: 2.43 MIL/uL — ABNORMAL LOW (ref 4.22–5.81)
RDW: 18.5 % — ABNORMAL HIGH (ref 11.5–15.5)
WBC: 5.2 10*3/uL (ref 4.0–10.5)
nRBC: 0 % (ref 0.0–0.2)

## 2023-06-26 LAB — TYPE AND SCREEN
ABO/RH(D): A POS
Antibody Screen: POSITIVE
DAT, IgG: NEGATIVE

## 2023-06-26 LAB — HEMOGLOBIN AND HEMATOCRIT, BLOOD
HCT: 21.9 % — ABNORMAL LOW (ref 39.0–52.0)
HCT: 23.9 % — ABNORMAL LOW (ref 39.0–52.0)
HCT: 23.2 % — ABNORMAL LOW (ref 39.0–52.0)
Hemoglobin: 6.9 g/dL — CL (ref 13.0–17.0)
Hemoglobin: 7.4 g/dL — ABNORMAL LOW (ref 13.0–17.0)
Hemoglobin: 7.4 g/dL — ABNORMAL LOW (ref 13.0–17.0)

## 2023-06-26 LAB — PREPARE RBC (CROSSMATCH)

## 2023-06-26 MED ORDER — POLYETHYLENE GLYCOL 3350 17 GM/SCOOP PO POWD
0.5000 | Freq: Once | ORAL | Status: AC
  Administered 2023-06-26: 127.5 g via ORAL
  Filled 2023-06-26: qty 255

## 2023-06-26 MED ORDER — ONDANSETRON 4 MG PO TBDP: 4.0000 mg | ORAL_TABLET | Freq: Once | ORAL | Status: DC | PRN

## 2023-06-26 MED ORDER — SODIUM CHLORIDE 0.9% IV SOLUTION: Freq: Once | INTRAVENOUS | Status: DC

## 2023-06-26 NOTE — Progress Notes (Signed)
[facility]  PROGRESS NOTE  Harry Patterson. [id number]:[id number] DOB: 1946-07-04 DOA: 06/21/2023 PCP: Lynnea Ferrier, MD   Brief HPI:    77 y.o. male with medical history significant of hypertension, neuropathy, hyperlipidemia, alcohol use, B12 deficiency, Barrett's esophagus, BPH, chronic prostatitis presenting with ongoing GI bleed despite interventions at [facility].  Workup at [facility] included EGD with Barrett's esophagus but no source of bleeding. Colonoscopy with hemorrhoids and diverticulosis with no source of bleeding but blood noted in the colon. Capsule endoscopy without obvious source of bleeding. CTA showing small upper bleed. IR intervention but no obvious extravasation so no embolization formed.  Recommended for advanced endoscopy but declined by [facility] or [facility] due to capacity. Accepted by [facility] here for trial of single lumen enteroscopy.    Assessment/Plan:   GI bleed/symptomatic anemia -Initially presented to [facility] with GI bleed; underwent EGD, colonoscopy -EGD only showed Barrett esophagus, no obvious source of bleeding -Colonoscopy on 11/18 showed nonbleeding hemorrhoids, nonbleeding diverticulosis, blood in the colon but no obvious source of bleeding -Capsule endoscopy on 11/19 showed findings of active bleeding but no obvious source -CTA on 11/21 showed small bowel bleed in the upper abdomen; IR attempted intervention but no extravasation from SMA or distal branches was noted and no embolization was performed -Neck step advanced endoscopy, [facility] for at capacity and declined transfer -GI Dr Barron Alvine accepted the patient for trial of single lumen enteroscopy -Underwent enteroscopy today found to have 2 small AVMs which were cauterized, no obvious source of bleeding -GI added on octreotide and recommended to observe over next few days.  If continues to have bleeding, may need repeat CTA or capsule endoscopy.  For transfer to academic center for further  options -Underwent nuclear meckel scan yesterday, which was negative -Video capsule endoscopy testing done on 11/25, capsule did not pass beyond stomach -Plan for video capsule endoscopy with EGD to bypass stomach on 11/27  Anemia, acute blood loss -Secondary to above -Has been getting blood transfusion 1 to 2 units/day with total of 11 units transfused -Hemoglobin is 7.2 today -Continue Protonix, octreotide -Continue to follow H&H every 8 hours  Acute kidney injury -Creatinine was elevated 1.3 from baseline 1.0 -Received IV fluids at [facility] -Creatinine improved to 1.37 -Check BMP today  Osteoarthritis -Patient has been taking Celebrex for a long time, was stopped by wife few days ago before bleeding episode -Recommend not to take Celebrex as outpatient   Hypertension -Continue metoprolol -Due to soft blood pressure  we will cut down the dose of metoprolol to 12.5 mg p.o. twice daily.  History of SVT -Metoprolol was held and patient developed SVT -Metoprolol was restarted but due to soft blood pressure dose of metoprolol will be changed to 12.5 mg p.o. twice daily   BPH -Hold home tamsulosin due to low blood pressure  B12 deficiency -Continue B12 supplementation  Neuropathy -Continue gabapentin  Suprapubic tenderness > Noted at [facility], due to patient being status post cystoscopy with balloon urethral dilation on 11/5.  Medications     sodium chloride   Intravenous Once   sodium chloride   Intravenous Once   ascorbic acid  500 mg Oral QHS   atorvastatin  20 mg Oral QHS   cholecalciferol  2,000 Units Oral QHS   cyanocobalamin  1,000 mcg Oral QHS   folic acid  2 mg Oral q AM   gabapentin  300 mg Oral BID   metoprolol tartrate  12.5 mg Oral BID  octreotide  100 mcg Subcutaneous Q12H   omega-3 acid ethyl esters  1 g Oral BID   pantoprazole  40 mg Intravenous Q12H   polyethylene glycol powder  0.5 Container Oral Once   sodium chloride flush  3 mL Intravenous Q12H    tamsulosin  0.4 mg Oral Daily     Data Reviewed:   CBG:  No results for input(s): "GLUCAP" in the last 168 hours.   SpO2: 95 % O2 Flow Rate (L/min): 0 L/min    Vitals:   06/25/23 2107 06/25/23 2345 06/26/23 0434 06/26/23 0747  BP: (!) 124/55 (!) 127/48 (!) 122/42 (!) 118/50  Pulse: 68 61 68 60  Resp:  18 18   Temp:  99.5 F (37.5 C) 98.4 F (36.9 C) 98.2 F (36.8 C)  TempSrc:  Oral Oral   SpO2: 93% 97% 94% 95%  Weight:      Height:          Data Reviewed:  Basic Metabolic Panel: Recent Labs  Lab 06/20/23 2238 06/21/23 0255 06/22/23 0326 06/23/23 0716 06/23/23 2333  NA 137 139 138 134* 133*  K 4.2 4.4 4.3 4.5 3.9  CL 107 108 107 104 103  CO2 23 24 23 22 23   GLUCOSE 168* 181* 166* 170* 118*  BUN 31* 33* 38* 37* 33*  CREATININE 1.14 1.27* 1.42* 1.43* 1.37*  CALCIUM 7.0* 7.2* 7.1* 7.0* 7.0*  MG 2.1  --   --   --   --     CBC: Recent Labs  Lab 06/20/23 0631 06/20/23 1646 06/20/23 2238 06/21/23 0255 06/21/23 0922 06/23/23 0736 06/23/23 1655 06/23/23 2333 06/24/23 0845 06/24/23 1840 06/25/23 0520 06/25/23 1034 06/25/23 2010 06/26/23 0509 06/26/23 0859  WBC 11.6* 10.8* 13.5* 14.4*   < > 13.2* 10.6* 9.6 7.5  --   --   --   --   --  5.2  NEUTROABS 7.7 7.7 9.7* 11.1*  --   --   --   --  5.4  --   --   --   --   --   --   HGB 6.7* 6.1* 7.4* 6.9*   < > 7.2* 6.4* 7.3* 7.1*   < > 6.4* 7.2* 8.4* 6.9* 7.2*  HCT 19.6* 18.0* 21.8* 20.3*   < > 21.8* 20.4* 22.6* 22.2*   < > 20.7* 22.9* 26.8* 21.9* 23.0*  MCV 95.1 91.4 90.8 93.1   < > 93.6 94.4 92.2 93.3  --   --   --   --   --  94.7  PLT 144* 137* 155 148*   < > 119* 116* 114* 115*  --   --   --   --   --  151   < > = values in this interval not displayed.    LFT Recent Labs  Lab 06/20/23 2238 06/22/23 0326  AST 13* 14*  ALT 10 8  ALKPHOS 29* 26*  BILITOT 0.8 0.6  PROT 4.1* 3.6*  ALBUMIN 2.3* 2.0*     Antibiotics: Anti-infectives (From admission, onward)    None        DVT prophylaxis:  SCDs  Code Status: Full code  Family Communication: Discussed with patient wife at bedside   CONSULTS gastroenterology   Subjective   Denies having bloody bowel movement.  No abdominal pain.  Video capsule endoscopy done however capsule did not pass beyond stomach.  Objective    Physical Examination:  General-appears in no acute distress Heart-S1-S2, regular, no murmur auscultated  Lungs-clear to auscultation bilaterally, no wheezing or crackles auscultated Abdomen-soft, nontender, no organomegaly Extremities-no edema in the lower extremities Neuro-alert, oriented x3, no focal deficit noted   Status is: Inpatient:             [name]   [facility] If 7PM-7AM, please contact night-coverage at [email or web address], Office  [phone]   06/26/2023, 2:22 PM  LOS: 5 days

## 2023-06-26 NOTE — Progress Notes (Addendum)
Patient ID: Harry Patterson., male   DOB: 1946-02-07, 77 y.o.   MRN: [id number]       Progress Note   Subjective  CC; transfer from [facility] with persistent acute GI bleed  Patient has not any rectal bleeding.  His last episode of rectal bleeding was 4 days ago.  He is otherwise feeling well.     Objective   Vital signs in last 24 hours: Temp:  [98.1 F (36.7 C)-99.5 F (37.5 C)] 98.2 F (36.8 C) (11/26 0747) Pulse Rate:  [60-82] 60 (11/26 0747) Resp:  [18] 18 (11/26 0434) BP: (109-127)/(42-62) 118/50 (11/26 0747) SpO2:  [91 %-97 %] 95 % (11/26 0747) Last BM Date : 06/22/23 General:    Elderly white male in NAD Heart:  Regular rate and rhythm; no murmurs Lungs: Respirations even and unlabored, lungs CTA bilaterally Abdomen:  Soft, nondistended. Normal bowel sounds. Extremities:  Without edema. Neurologic:  Alert and oriented,  grossly normal neurologically. Psych:  Cooperative. Normal mood and affect.  Intake/Output from previous day: 11/25 0701 - 11/26 0700 In: 3 [I.V.:3] Out: 1925 [Urine:1925] Intake/Output this shift: Total I/O In: -  Out: 750 [Urine:750]  Lab Results: Recent Labs    06/23/23 1655 06/23/23 2333 06/24/23 0845 06/24/23 1840 06/25/23 1034 06/25/23 2010 06/26/23 0509  WBC 10.6* 9.6 7.5  --   --   --   --   HGB 6.4* 7.3* 7.1*   < > 7.2* 8.4* 6.9*  HCT 20.4* 22.6* 22.2*   < > 22.9* 26.8* 21.9*  PLT 116* 114* 115*  --   --   --   --    < > = values in this interval not displayed.   BMET Recent Labs    06/23/23 2333  NA 133*  K 3.9  CL 103  CO2 23  GLUCOSE 118*  BUN 33*  CREATININE 1.37*  CALCIUM 7.0*   LFT No results for input(s): "PROT", "ALBUMIN", "AST", "ALT", "ALKPHOS", "BILITOT", "BILIDIR", "IBILI" in the last 72 hours.  PT/INR No results for input(s): "LABPROT", "INR" in the last 72 hours.  Studies/Results: No results found.     Assessment / Plan:    #54 77 year old white male with acute major GI bleed  now status post 15 units of packed RBCs EGD and colonoscopy without source of bleeding found/done at [facility] prior to transfer Capsule endoscopy prior to transfer showed brisk active bleeding and what appeared to be the mid small bowel, and there was also question of additional bleeding site in the distal small bowel within a couple of minutes of the cecum  Transferred here for single balloon Single balloon enteroscopy 06/22/2023, no source found, no active bleeding, did have 2 tiny proximal duodenal AVMs treated with APC  Meckel's scan 11/23 negative  VCE 11/24 did not leave the stomach.   Continuing SQ Octreotide  Has not had any gross evidence of bleeding for the last 4 days.  Hb has remained stable for the last few days. Patient would like to know what have caused his bleeding so will plan for EGD to drop the VCE capsule tomorrow to bypass the stomach.  #2 history of B12 deficiency #3.  History of hypertension #4.  Barrett's esophagus #5.  Osteoarthritis-had been on Celebrex chronically, prior to admission , his wife had stopped that a couple of weeks ago  Will plan for abdominal x-ray today to assess location of capsule Patient would like to stay for EGD with VCE tomorrow.  Principal Problem:   GI bleed Active Problems:   Benign prostatic hyperplasia without lower urinary tract symptoms   B12 deficiency   Chronic prostatitis   Essential hypertension   Barrett's esophagus without dysplasia   Polyneuropathy   Pure hypercholesterolemia   Upper GI bleed   Symptomatic anemia   AVM (arteriovenous malformation) of small bowel, acquired with hemorrhage   ABLA (acute blood loss anemia)     LOS: 5 days   Daneil Dolin  06/26/2023, 8:53 AM

## 2023-06-26 NOTE — H&P (View-Only) (Signed)
Patient ID: Harry Patterson., male   DOB: [birth year], 77 y.o.   MRN: [id number]       Progress Note   Subjective  CC; transfer from [facility] with persistent acute GI bleed  Patient has not any rectal bleeding.  His last episode of rectal bleeding was 4 days ago.  He is otherwise feeling well.     Objective   Vital signs in last 24 hours: Temp:  [98.1 F (36.7 C)-99.5 F (37.5 C)] 98.2 F (36.8 C) (11/26 0747) Pulse Rate:  [60-82] 60 (11/26 0747) Resp:  [18] 18 (11/26 0434) BP: (109-127)/(42-62) 118/50 (11/26 0747) SpO2:  [91 %-97 %] 95 % (11/26 0747) Last BM Date : 06/22/23 General:    Elderly white male in NAD Heart:  Regular rate and rhythm; no murmurs Lungs: Respirations even and unlabored, lungs CTA bilaterally Abdomen:  Soft, nondistended. Normal bowel sounds. Extremities:  Without edema. Neurologic:  Alert and oriented,  grossly normal neurologically. Psych:  Cooperative. Normal mood and affect.  Intake/Output from previous day: 11/25 0701 - 11/26 0700 In: 3 [I.V.:3] Out: 1925 [Urine:1925] Intake/Output this shift: Total I/O In: -  Out: 750 [Urine:750]  Lab Results: Recent Labs    06/23/23 1655 06/23/23 2333 06/24/23 0845 06/24/23 1840 06/25/23 1034 06/25/23 2010 06/26/23 0509  WBC 10.6* 9.6 7.5  --   --   --   --   HGB 6.4* 7.3* 7.1*   < > 7.2* 8.4* 6.9*  HCT 20.4* 22.6* 22.2*   < > 22.9* 26.8* 21.9*  PLT 116* 114* 115*  --   --   --   --    < > = values in this interval not displayed.   BMET Recent Labs    06/23/23 2333  NA 133*  K 3.9  CL 103  CO2 23  GLUCOSE 118*  BUN 33*  CREATININE 1.37*  CALCIUM 7.0*   LFT No results for input(s): "PROT", "ALBUMIN", "AST", "ALT", "ALKPHOS", "BILITOT", "BILIDIR", "IBILI" in the last 72 hours.  PT/INR No results for input(s): "LABPROT", "INR" in the last 72 hours.  Studies/Results: No results found.     Assessment / Plan:    #34 77 year old white male with acute major GI bleed  now status post 15 units of packed RBCs EGD and colonoscopy without source of bleeding found/done at [facility] prior to transfer Capsule endoscopy prior to transfer showed brisk active bleeding and what appeared to be the mid small bowel, and there was also question of additional bleeding site in the distal small bowel within a couple of minutes of the cecum  Transferred here for single balloon Single balloon enteroscopy 06/22/2023, no source found, no active bleeding, did have 2 tiny proximal duodenal AVMs treated with APC  Meckel's scan 11/23 negative  VCE 11/24 did not leave the stomach.   Continuing SQ Octreotide  Has not had any gross evidence of bleeding for the last 4 days.  Hb has remained stable for the last few days. Patient would like to know what have caused his bleeding so will plan for EGD to drop the VCE capsule tomorrow to bypass the stomach.  #2 history of B12 deficiency #3.  History of hypertension #4.  Barrett's esophagus #5.  Osteoarthritis-had been on Celebrex chronically, prior to admission , his wife had stopped that a couple of weeks ago  Will plan for abdominal x-ray today to assess location of capsule Patient would like to stay for EGD with VCE tomorrow.  Principal Problem:   GI bleed Active Problems:   Benign prostatic hyperplasia without lower urinary tract symptoms   B12 deficiency   Chronic prostatitis   Essential hypertension   Barrett's esophagus without dysplasia   Polyneuropathy   Pure hypercholesterolemia   Upper GI bleed   Symptomatic anemia   AVM (arteriovenous malformation) of small bowel, acquired with hemorrhage   ABLA (acute blood loss anemia)     LOS: 5 days   Daneil Dolin  06/26/2023, 8:53 AM

## 2023-06-26 NOTE — Progress Notes (Signed)
Per provider, patient does not need blood transfusion at this time despite previous transfuse order. Patient's bed lowered with call bell within reach.

## 2023-06-26 NOTE — Progress Notes (Signed)
Date and time results received: 06/26/23 0630 (use smartphrase ".now" to insert current time)  Test: hgb Critical Value: 6.9  Name of Provider Notified: Dr. Loney Loh  Orders Received? Or Actions Taken?:  No new orders.

## 2023-06-27 ENCOUNTER — Inpatient Hospital Stay (HOSPITAL_COMMUNITY): Payer: BC Managed Care – PPO | Admitting: Anesthesiology

## 2023-06-27 ENCOUNTER — Encounter (HOSPITAL_COMMUNITY)
Admission: AD | Disposition: A | Discharge status: Home Health | Payer: Self-pay | Source: Other Acute Inpatient Hospital | Attending: Family Medicine

## 2023-06-27 ENCOUNTER — Encounter (HOSPITAL_COMMUNITY): Payer: Self-pay | Admitting: Internal Medicine

## 2023-06-27 DIAGNOSIS — D649 Anemia, unspecified: Secondary | ICD-10-CM | POA: Diagnosis not present

## 2023-06-27 DIAGNOSIS — K922 Gastrointestinal hemorrhage, unspecified: Secondary | ICD-10-CM | POA: Diagnosis not present

## 2023-06-27 DIAGNOSIS — K5521 Angiodysplasia of colon with hemorrhage: Secondary | ICD-10-CM | POA: Diagnosis not present

## 2023-06-27 DIAGNOSIS — R6511 Systemic inflammatory response syndrome (SIRS) of non-infectious origin with acute organ dysfunction: Secondary | ICD-10-CM

## 2023-06-27 HISTORY — PX: GIVENS CAPSULE STUDY: SHX5432

## 2023-06-27 HISTORY — PX: ESOPHAGOGASTRODUODENOSCOPY (EGD) WITH PROPOFOL: SHX5813

## 2023-06-27 LAB — HEMOGLOBIN AND HEMATOCRIT, BLOOD
HCT: 22.8 % — ABNORMAL LOW (ref 39.0–52.0)
Hemoglobin: 7.2 g/dL — ABNORMAL LOW (ref 13.0–17.0)

## 2023-06-27 SURGERY — ESOPHAGOGASTRODUODENOSCOPY (EGD) WITH PROPOFOL
Anesthesia: Monitor Anesthesia Care

## 2023-06-27 MED ORDER — LIDOCAINE HCL (CARDIAC) PF 100 MG/5ML IV SOSY
PREFILLED_SYRINGE | INTRAVENOUS | Status: DC | PRN
  Administered 2023-06-27: 80 mg via INTRAVENOUS

## 2023-06-27 MED ORDER — LACTULOSE 10 GM/15ML PO SOLN
10.0000 g | Freq: Three times a day (TID) | ORAL | Status: DC
  Filled 2023-06-27 (×4): qty 15

## 2023-06-27 MED ORDER — EPHEDRINE SULFATE (PRESSORS) 50 MG/ML IJ SOLN
INTRAMUSCULAR | Status: DC | PRN
  Administered 2023-06-27 (×2): 5 mg via INTRAVENOUS

## 2023-06-27 MED ORDER — ONDANSETRON HCL 4 MG/2ML IJ SOLN
INTRAMUSCULAR | Status: DC | PRN
  Administered 2023-06-27: 4 mg via INTRAVENOUS

## 2023-06-27 MED ORDER — PHENYLEPHRINE 80 MCG/ML (10ML) SYRINGE FOR IV PUSH (FOR BLOOD PRESSURE SUPPORT)
PREFILLED_SYRINGE | INTRAVENOUS | Status: DC | PRN
  Administered 2023-06-27: 160 ug via INTRAVENOUS
  Administered 2023-06-27: 80 ug via INTRAVENOUS

## 2023-06-27 MED ORDER — SODIUM CHLORIDE 0.9 % IV SOLN: INTRAVENOUS | Status: DC | PRN

## 2023-06-27 MED ORDER — PROPOFOL 10 MG/ML IV BOLUS
INTRAVENOUS | Status: DC | PRN
  Administered 2023-06-27 (×2): 20 mg via INTRAVENOUS
  Administered 2023-06-27: 50 mg via INTRAVENOUS
  Administered 2023-06-27 (×2): 20 mg via INTRAVENOUS

## 2023-06-27 SURGICAL SUPPLY — 14 items

## 2023-06-27 NOTE — Anesthesia Preprocedure Evaluation (Signed)
Anesthesia Evaluation  Patient identified by MRN, date of birth, ID band Patient awake    Reviewed: Allergy & Precautions, NPO status , Patient's Chart, lab work & pertinent test results, reviewed documented beta blocker date and time   Airway Mallampati: II  TM Distance: >3 FB Neck ROM: Full    Dental no notable dental hx.    Pulmonary sleep apnea and Continuous Positive Airway Pressure Ventilation    Pulmonary exam normal breath sounds clear to auscultation       Cardiovascular hypertension (131/70 preop), Pt. on medications and Pt. on home beta blockers Normal cardiovascular exam Rhythm:Regular Rate:Normal     Neuro/Psych negative neurological ROS  negative psych ROS   GI/Hepatic Neg liver ROS,GERD  Medicated and Controlled,,GIB: presented with dizziness, low blood pressure, bright red blood per rectum.  Found to have hemoglobin 7.5 down from baseline of 12. Workup at [facility] included EGD with Barrett's esophagus but no source of bleeding.  Colonoscopy with hemorrhoids and diverticulosis with no source of bleeding but blood noted in the colon.  Capsule endoscopy without obvious source of bleeding.  CTA showing small upper bleed.  IR intervention but no obvious extravasation so no embolization formed.  Recommended for advanced endoscopy but declined by [facility] or [facility] due to capacity.  Accepted by [facility] here for trial of single lumen enteroscopy. Patient has required 1 to 2 units transfusion per day while at [facility].  Total of 9 units transfused    Endo/Other  negative endocrine ROS    Renal/GU Renal InsufficiencyRenal diseaseCr 1.2  negative genitourinary   Musculoskeletal  (+) Arthritis , Osteoarthritis,    Abdominal   Peds  Hematology  (+) Blood dyscrasia, anemia Hb 7.2, plt 151   Anesthesia Other Findings   Reproductive/Obstetrics negative OB ROS                             Anesthesia  Physical Anesthesia Plan  ASA: 3  Anesthesia Plan: MAC   Post-op Pain Management:    Induction:   PONV Risk Score and Plan: 2 and Propofol infusion and TIVA  Airway Management Planned: Natural Airway and Simple Face Mask  Additional Equipment: None  Intra-op Plan:   Post-operative Plan:   Informed Consent: I have reviewed the patients History and Physical, chart, labs and discussed the procedure including the risks, benefits and alternatives for the proposed anesthesia with the patient or authorized representative who has indicated his/her understanding and acceptance.       Plan Discussed with: CRNA  Anesthesia Plan Comments:        Anesthesia Quick Evaluation

## 2023-06-27 NOTE — Interval H&P Note (Signed)
History and Physical Interval Note:  06/27/2023 9:55 AM  Harry Patterson.  has presented today for surgery, with the diagnosis of Gastroinestinal bleeding.  The various methods of treatment have been discussed with the patient and family. After consideration of risks, benefits and other options for treatment, the patient has consented to  Procedure(s): ESOPHAGOGASTRODUODENOSCOPY (EGD) WITH PROPOFOL (N/A) GIVENS CAPSULE STUDY (N/A) as a surgical intervention.  The patient's history has been reviewed, patient examined, no change in status, stable for surgery.  I have reviewed the patient's chart and labs.  Questions were answered to the patient's satisfaction.     Imogene Burn

## 2023-06-27 NOTE — Transfer of Care (Signed)
Immediate Anesthesia Transfer of Care Note  Patient: Harry Patterson.  Procedure(s) Performed: ESOPHAGOGASTRODUODENOSCOPY (EGD) WITH PROPOFOL GIVENS CAPSULE STUDY  Patient Location: PACU and Endoscopy Unit  Anesthesia Type:MAC  Level of Consciousness: drowsy  Airway & Oxygen Therapy: Patient Spontanous Breathing and Patient connected to face mask  Post-op Assessment: Report given to RN and Post -op Vital signs reviewed and stable  Post vital signs: Reviewed and stable  Last Vitals:  Vitals Value Taken Time  BP 149/65 06/27/23 1133  Temp    Pulse 100 06/27/23 1134  Resp 31 06/27/23 1134  SpO2 99 % 06/27/23 1134  Vitals shown include unfiled device data.  Last Pain:  Vitals:   06/27/23 0943  TempSrc: Temporal  PainSc: 0-No pain         Complications: No notable events documented.

## 2023-06-27 NOTE — Op Note (Signed)
[facility] Patient Name: Harry Patterson Procedure Date : 06/27/2023 MRN: [id number] Attending MD: Particia Lather , , [id number] Date of Birth: 1946/07/26 CSN: [id number] Age: 76 Admit Type: Inpatient Procedure:                Upper GI endoscopy Indications:              Gastrointestinal bleeding of unknown origin, Anemia Providers:                Madelyn Brunner" Jenne Campus RN, RN, Stephanie Coup, CRNA, Melany Guernsey, Technician Referring MD:             Angus Seller Patel-nguyen Medicines:                Monitored Anesthesia Care Complications:            No immediate complications. Estimated Blood Loss:     Estimated blood loss: none. Procedure:                Pre-Anesthesia Assessment:                           - Prior to the procedure, a History and Physical                            was performed, and patient medications and                            allergies were reviewed. The patient's tolerance of                            previous anesthesia was also reviewed. The risks                            and benefits of the procedure and the sedation                            options and risks were discussed with the patient.                            All questions were answered, and informed consent                            was obtained. Prior Anticoagulants: The patient has                            taken no anticoagulant or antiplatelet agents. ASA                            Grade Assessment: II - A patient with mild systemic                            disease. After reviewing the risks and benefits,  the patient was deemed in satisfactory condition to                            undergo the procedure.                           After obtaining informed consent, the endoscope was                            passed under direct vision. Throughout the                            procedure,  the patient's blood pressure, pulse, and                            oxygen saturations were monitored continuously. The                            GIF-H190 ([id number]) Olympus endoscope was introduced                            through the mouth, and advanced to the second part                            of duodenum. The upper GI endoscopy was                            accomplished without difficulty. The patient                            tolerated the procedure well. Scope In: Scope Out: Findings:      Salmon-colored mucosa was present. The maximum longitudinal extent of       these esophageal mucosal changes was 3 cm in length.      Localized mild inflammation characterized by congestion (edema) and       erythema was found in the gastric body.      A few sessile polyps with no bleeding and no stigmata of recent bleeding       were found in the gastric body.      The examined duodenum was normal. Using the endoscope, the video capsule       enteroscope was advanced into the second portion of the duodenum. Impression:               - Salmon-colored mucosa classified as Barrett's                            stage C2-M3 per Prague criteria.                           - Gastritis.                           - A few gastric polyps.                           - Normal  examined duodenum.                           - Successful completion of the Video Capsule                            Enteroscope placement.                           - No specimens collected. Recommendation:           - Return patient to hospital ward for ongoing care.                           - Will follow up on the results of the patient's                            VCE.                           - The findings and recommendations were discussed                            with the patient. Procedure Code(s):        --- Professional ---                           [phone], Esophagogastroduodenoscopy, flexible,                             transoral; diagnostic, including collection of                            specimen(s) by brushing or washing, when performed                            (separate procedure) Diagnosis Code(s):        --- Professional ---                           K22.70, Barrett's esophagus without dysplasia                           K29.70, Gastritis, unspecified, without bleeding                           K31.7, Polyp of stomach and duodenum                           K92.2, Gastrointestinal hemorrhage, unspecified                           D64.9, Anemia, unspecified CPT copyright 2022 American Medical Association. All rights reserved. The codes documented in this report are preliminary and upon coder review may  be revised to meet current compliance requirements. Dr Particia Lather "Alan Ripper" Leonides Schanz,  06/27/2023 11:34:17 AM Number of Addenda: 0

## 2023-06-27 NOTE — Progress Notes (Signed)
PROGRESS NOTE    Harry Patterson.  BJY:[id number] DOB: [birth year] DOA: 06/21/2023 PCP: Lynnea Ferrier, MD  No chief complaint on file.   Hospital Course:  Harry Patterson. is 76 y.o. male with hypertension, neuropathy, hyperlipidemia, alcohol use disorder, B12 deficiency, Barrett's esophagus, BPH, chronic prostatitis who presented as a transfer from [facility].  Patient was initially admitted to [facility] for GI bleed.  He received EGD which revealed Barrett's esophagus but no source of bleeding.  Subsequently underwent colonoscopy which revealed hemorrhoids and diverticulosis but no acute source of bleeding the small amount of blood was seen in the colon.  Capsule endoscopy was also negative.  Patient underwent CTA which showed small upper bleed.  Underwent IR evaluation but there is no obvious extravasations no embolization was performed.  Was recommended that he proceed with advanced endoscopy but was declined by [facility] and [facility] due to capacity.  He was then transferred to [facility] for single-lumen enteroscopy.  Enteroscopy revealed 2 small AVMs which were cauterized but no obvious source of bleeding.  He was started on octreotide.  He also underwent buccal scan which was negative.  Video capsule endoscopy testing on 11/25 but capsule did not pass beyond the stomach.  Subjective: Patient evaluated after endoscopy today.  He reports he has still not had any bowel movements but he believes he is beginning to feel flatulence.  He has ambulated some since returning to his room.  He is going to continue to try and increase his mobility.  No bleeding.  No nausea, no vomiting, no abdominal pain.    Objective: Vitals:   06/26/23 1948 06/27/23 0022 06/27/23 0401 06/27/23 0754  BP: 132/62 128/61 132/63 131/70  Pulse: 79 65 65 69  Resp: 18 18 18 17   Temp: 98.9 F (37.2 C) 99.4 F (37.4 C) 98.6 F (37 C) 98.6 F (37 C)  TempSrc:    Oral  SpO2: 97% 100% 92% 94%  Weight:      Height:         Intake/Output Summary (Last 24 hours) at 06/27/2023 0865 Last data filed at 06/27/2023 7846 Gross per 24 hour  Intake --  Output 1000 ml  Net -1000 ml   Filed Weights   06/21/23 1732 06/24/23 1452  Weight: 87.5 kg 87.5 kg    Examination: General exam: Appears calm and comfortable, NAD Respiratory system: No work of breathing, symmetric chest wall expansion Gastrointestinal system: Abdomen is nondistended, soft and nontender.  Neuro: Alert and oriented. No focal neurological deficits. Extremities: Symmetric, expected ROM Skin: No rashes, lesions Psychiatry: Demonstrates appropriate judgement and insight. Mood & affect appropriate for situation.   Assessment & Plan:  Principal Problem:   GI bleed Active Problems:   Upper GI bleed   Symptomatic anemia   Essential hypertension   Polyneuropathy   Pure hypercholesterolemia   Benign prostatic hyperplasia without lower urinary tract symptoms   B12 deficiency   Chronic prostatitis   Barrett's esophagus without dysplasia   AVM (arteriovenous malformation) of small bowel, acquired with hemorrhage   ABLA (acute blood loss anemia)  GI bleed Symptomatic anemia - Now status post multiple interventions.  Bleeding has slowed - EGD with Barrett's esophagus - Colonoscopy 11/18 with nonbleeding hemorrhoids, nonbleeding diverticulosis, blood in the colon - Capsule endoscopy 11/19 revealed active bleeding with no obvious source - CTA 11/21: Small bowel bleeding upper abdomen, IR attempted embolization however there is no active extravasation from SMA or distal branches embolization could not be  performed - Patient denied from [facility] and [facility] due to capacity - Single lumen enteroscopy revealing 2 small AVMs which were cauterized - Negative Meckel scan - 11/25 video capsule endoscopy attempted but did not pass down the stomach - Now on octreotide - EGD assisted video capsule endoscopy to bypass the stomach today.  Follow-up on results -  No bowel movement in 4 days, is on MiraLAX.  Will add scheduled lactulose.  Anemia, acute blood loss - Secondary to above - Regularly getting 1 to 2 units PRBC a day.  Total of ~17 units transfused so far - Hemoglobin currently stable above 7 - Continue Protonix and octreotide - Continue to monitor hemoglobin closely, low threshold for repeat transfusion  AKI - Baseline creatinine appears to be 1.0. - Status post IV fluids - Continue monitoring BMP - Avoid nephrotoxic meds - Renally dosed for needed  Osteoarthritis - Has been taking Celebrex outpatient for extended period of time.  Reports he recently discontinued prior to the bleeding episode which began - Have counseled discontinue further NSAIDs  Hypertension - Monitor blood pressure closely, unlikely to tolerate significant antihypertensives - Continue current meds  History of SVT - Metoprolol was held and patient developed SVT.  Metoprolol has since been restarted, titrating dose as BP tolerates  BPH - Hold home dose tamsulosin given hypotension  B12 deficiency - Continue B12 supplementation  Neuropathy - Continue gabapentin  Prepubic tenderness - Noted at [facility].  Patient is status post cystoscopy with balloon urethral dilation on 11/5  BMI 29 - Outpatient follow up for lifestyle modification and risk factor management   DVT prophylaxis: Hold anticoagulation in setting of GI bleed, SCDs only Code Status: Full Family Communication: Patient's wife present at bedside.  Extensive discussion with both of them regarding clinical care plan Disposition:  Status is: Inpatient Remains inpatient appropriate because: ongoing workup.    Consultants:  GI  Antimicrobials:  Anti-infectives (From admission, onward)    None       Data Reviewed: I have personally reviewed following labs and imaging studies CBC: Recent Labs  Lab 06/20/23 1646 06/20/23 2238 06/21/23 0255 06/21/23 0922 06/23/23 0736 06/23/23 1655  06/23/23 2333 06/24/23 0845 06/24/23 1840 06/26/23 0509 06/26/23 0859 06/26/23 1529 06/26/23 1922 06/27/23 0434  WBC 10.8* 13.5* 14.4*   < > 13.2* 10.6* 9.6 7.5  --   --  5.2  --   --   --   NEUTROABS 7.7 9.7* 11.1*  --   --   --   --  5.4  --   --   --   --   --   --   HGB 6.1* 7.4* 6.9*   < > 7.2* 6.4* 7.3* 7.1*   < > 6.9* 7.2* 7.4* 7.4* 7.2*  HCT 18.0* 21.8* 20.3*   < > 21.8* 20.4* 22.6* 22.2*   < > 21.9* 23.0* 23.9* 23.2* 22.8*  MCV 91.4 90.8 93.1   < > 93.6 94.4 92.2 93.3  --   --  94.7  --   --   --   PLT 137* 155 148*   < > 119* 116* 114* 115*  --   --  151  --   --   --    < > = values in this interval not displayed.   Basic Metabolic Panel: Recent Labs  Lab 06/20/23 2238 06/21/23 0255 06/22/23 0326 06/23/23 0716 06/23/23 2333 06/26/23 1529  NA 137 139 138 134* 133* 138  K 4.2 4.4 4.3 4.5  3.9 4.0  CL 107 108 107 104 103 107  CO2 23 24 23 22 23 25   GLUCOSE 168* 181* 166* 170* 118* 164*  BUN 31* 33* 38* 37* 33* 12  CREATININE 1.14 1.27* 1.42* 1.43* 1.37* 1.20  CALCIUM 7.0* 7.2* 7.1* 7.0* 7.0* 7.7*  MG 2.1  --   --   --   --   --    GFR: Estimated Creatinine Clearance: 56.3 mL/min (by C-G formula based on SCr of 1.2 mg/dL). Liver Function Tests: Recent Labs  Lab 06/20/23 2238 06/22/23 0326  AST 13* 14*  ALT 10 8  ALKPHOS 29* 26*  BILITOT 0.8 0.6  PROT 4.1* 3.6*  ALBUMIN 2.3* 2.0*   CBG: No results for input(s): "GLUCAP" in the last 168 hours.  No results found for this or any previous visit (from the past 240 hour(s)).   Radiology Studies: DG Abd 1 View  Result Date: 06/26/2023 CLINICAL DATA:  Capsule endoscopy EXAM: ABDOMEN - 1 VIEW COMPARISON:  06/20/2023 FINDINGS: Nonobstructive bowel gas pattern. Two endoscopy capsules are present within the lower abdomen, one of which projects over the right iliac wing, which could be within distal small bowel or at the base of the cecum. Additional capsule projects within the left lower quadrant, expected location  of the proximal sigmoid colon. IMPRESSION: Two endoscopy capsules are present within the lower abdomen, one of which projects over the right iliac wing, which could be within distal small bowel or at the base of the cecum. Additional capsule projects within the left lower quadrant, expected location of the proximal sigmoid colon. Electronically Signed   By: Duanne Guess D.O.   On: 06/26/2023 14:19    Scheduled Meds:  sodium chloride   Intravenous Once   sodium chloride   Intravenous Once   ascorbic acid  500 mg Oral QHS   atorvastatin  20 mg Oral QHS   cholecalciferol  2,000 Units Oral QHS   cyanocobalamin  1,000 mcg Oral QHS   folic acid  2 mg Oral q AM   gabapentin  300 mg Oral BID   metoprolol tartrate  12.5 mg Oral BID   octreotide  100 mcg Subcutaneous Q12H   omega-3 acid ethyl esters  1 g Oral BID   pantoprazole  40 mg Intravenous Q12H   sodium chloride flush  3 mL Intravenous Q12H   tamsulosin  0.4 mg Oral Daily   Continuous Infusions:   LOS: 6 days    Time spent:  Debarah Crape, DO [facility]  To contact the attending physician between 7A-7P please use Epic Chat. To contact the covering physician during after hours 7P-7A, please review Amion.   06/27/2023, 8:12 AM

## 2023-06-27 NOTE — Anesthesia Postprocedure Evaluation (Signed)
Anesthesia Post Note  Patient: Harry Patterson.  Procedure(s) Performed: ESOPHAGOGASTRODUODENOSCOPY (EGD) WITH PROPOFOL GIVENS CAPSULE STUDY     Patient location during evaluation: PACU Anesthesia Type: MAC Level of consciousness: awake and alert Pain management: pain level controlled Vital Signs Assessment: post-procedure vital signs reviewed and stable Respiratory status: spontaneous breathing, nonlabored ventilation and respiratory function stable Cardiovascular status: blood pressure returned to baseline and stable Postop Assessment: no apparent nausea or vomiting Anesthetic complications: no   No notable events documented.  Last Vitals:  Vitals:   06/27/23 1150 06/27/23 1221  BP: (!) 114/59 108/60  Pulse: 95 79  Resp: 19 17  Temp:  36.8 C  SpO2: 91% 94%    Last Pain:  Vitals:   06/27/23 1221  TempSrc: Oral  PainSc:                  Lannie Fields

## 2023-06-27 NOTE — Addendum Note (Signed)
Addendum  created 06/27/23 1334 by Jimmey Ralph, CRNA   Attestation recorded in [place], Intraprocedure Attestations filed

## 2023-06-27 NOTE — Progress Notes (Signed)
[facility] capsule endoscopy ordered by MD Dr Leonides Schanz.  Patient ingested capsule at 1120.  Per Given's capsule instructions, patient to remain NPO until 1320 at which time they may progress to clear liquid diet. At 1520 patient may have a small snack such as a half a sandwich or a bowl of soup. At 1920 patient may progress to previously ordered diet.  The capsule endoscopy study will conclude at 2320 at which time the recorder and leads or belt can be removed and placed in a patient belongings bag. Endoscopy staff will pick up the equipment in the AM.  Instructions provided to patient and inpatient RN. Patient and RN demonstrated understanding.

## 2023-06-28 DIAGNOSIS — K922 Gastrointestinal hemorrhage, unspecified: Secondary | ICD-10-CM | POA: Diagnosis not present

## 2023-06-28 DIAGNOSIS — K5521 Angiodysplasia of colon with hemorrhage: Secondary | ICD-10-CM | POA: Diagnosis not present

## 2023-06-28 DIAGNOSIS — D649 Anemia, unspecified: Secondary | ICD-10-CM | POA: Diagnosis not present

## 2023-06-28 LAB — CBC WITH DIFFERENTIAL/PLATELET
Abs Immature Granulocytes: 0.01 10*3/uL (ref 0.00–0.07)
Basophils Absolute: 0 10*3/uL (ref 0.0–0.1)
Basophils Relative: 1 %
Eosinophils Absolute: 0.2 10*3/uL (ref 0.0–0.5)
Eosinophils Relative: 5 %
HCT: 21.9 % — ABNORMAL LOW (ref 39.0–52.0)
Hemoglobin: 7.1 g/dL — ABNORMAL LOW (ref 13.0–17.0)
Immature Granulocytes: 0 %
Lymphocytes Relative: 19 %
Lymphs Abs: 0.7 10*3/uL (ref 0.7–4.0)
MCH: 31.4 pg (ref 26.0–34.0)
MCHC: 32.4 g/dL (ref 30.0–36.0)
MCV: 96.9 fL (ref 80.0–100.0)
Monocytes Absolute: 0.6 10*3/uL (ref 0.1–1.0)
Monocytes Relative: 16 %
Neutro Abs: 2.2 10*3/uL (ref 1.7–7.7)
Neutrophils Relative %: 59 %
Platelets: 160 10*3/uL (ref 150–400)
RBC: 2.26 MIL/uL — ABNORMAL LOW (ref 4.22–5.81)
RDW: 17.3 % — ABNORMAL HIGH (ref 11.5–15.5)
WBC: 3.7 10*3/uL — ABNORMAL LOW (ref 4.0–10.5)
nRBC: 0 % (ref 0.0–0.2)

## 2023-06-28 LAB — COMPREHENSIVE METABOLIC PANEL
ALT: 8 U/L (ref 0–44)
AST: 30 U/L (ref 15–41)
Albumin: 2.1 g/dL — ABNORMAL LOW (ref 3.5–5.0)
Alkaline Phosphatase: 45 U/L (ref 38–126)
Anion gap: 7 (ref 5–15)
BUN: 6 mg/dL — ABNORMAL LOW (ref 8–23)
CO2: 22 mmol/L (ref 22–32)
Calcium: 7.3 mg/dL — ABNORMAL LOW (ref 8.9–10.3)
Chloride: 107 mmol/L (ref 98–111)
Creatinine, Ser: 1.18 mg/dL (ref 0.61–1.24)
GFR, Estimated: >60 mL/min (ref >60)
Glucose, Bld: 126 mg/dL — ABNORMAL HIGH (ref 70–99)
Potassium: 4.8 mmol/L (ref 3.5–5.1)
Sodium: 136 mmol/L (ref 135–145)
Total Bilirubin: 1.8 mg/dL — ABNORMAL HIGH (ref <1.2)
Total Protein: 4 g/dL — ABNORMAL LOW (ref 6.5–8.1)

## 2023-06-28 LAB — PHOSPHORUS: Phosphorus: 3.5 mg/dL (ref 2.5–4.6)

## 2023-06-28 LAB — PREPARE RBC (CROSSMATCH)

## 2023-06-28 LAB — MAGNESIUM: Magnesium: 1.9 mg/dL (ref 1.7–2.4)

## 2023-06-28 MED ORDER — SODIUM CHLORIDE 0.9% IV SOLUTION: Freq: Once | INTRAVENOUS | Status: AC

## 2023-06-28 MED ORDER — FERROUS SULFATE 325 (65 FE) MG PO TABS
325.0000 mg | ORAL_TABLET | Freq: Every day | ORAL | Status: DC
  Administered 2023-06-29: 325 mg via ORAL
  Filled 2023-06-28: qty 1

## 2023-06-28 NOTE — Progress Notes (Addendum)
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 Jejunal tattoo seen between 01:53:20 and 04:03:32. 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 Maroon colored stool seen in the cecum.  Summary/Recommendations: If patient has recurrent bleeding in the future, then would perform SBE to target proximal small bowel angioectasia. Blood in the colon could be from clearance of a recent GI bleed from the small bowel, or could be due to recent bleeding from a Dieulafoy's lesion, which would not be visualized unless actively bleeding. Since the patient's hemoglobin has been stable for the last few days (last blood transfusion was 11/23), I would favor only performing further evaluation if the patient has drops in his hemoglobin.  Discussed this with the patient. Advanced his diet today. Will ask that his prior authorization for SQ octreotide be initiated to see if we can get approved for outpatient use. I started the patient on daily PO iron. I would favor that the patient get a 1 U pRBC transfusion prior to discharge. We will arrange for GI follow up with our clinic per patient preference.

## 2023-06-28 NOTE — Progress Notes (Signed)
PROGRESS NOTE    Harry Patterson.  NWG:[id number] DOB: [birth year] DOA: 06/21/2023 PCP: Lynnea Ferrier, MD  No chief complaint on file.   Hospital Course:  Harry Patterson. is 77 y.o. male with hypertension, neuropathy, hyperlipidemia, alcohol use disorder, B12 deficiency, Barrett's esophagus, BPH, chronic prostatitis who presented as a transfer from [facility].  Patient was initially admitted to [facility] for GI bleed.  He received EGD which revealed Barrett's esophagus but no source of bleeding.  Subsequently underwent colonoscopy which revealed hemorrhoids and diverticulosis but no acute source of bleeding the small amount of blood was seen in the colon.  Capsule endoscopy was also negative.  Patient underwent CTA which showed small upper bleed.  Underwent IR evaluation but there is no obvious extravasations no embolization was performed.  Was recommended that he proceed with advanced endoscopy but was declined by [facility] and [facility] due to capacity.  He was then transferred to [facility] for single-lumen enteroscopy.  Enteroscopy revealed 2 small AVMs which were cauterized but no obvious source of bleeding.  He was started on octreotide.  He also underwent buccal scan which was negative.  Video capsule endoscopy testing on 11/25 but capsule did not pass beyond the stomach.  On 11/27 he underwent EGD assisted video capsule endoscopy to bypass the stomach.  Subjective: Had large bloody BM yesterday afternoon. None since. Denies abdominal pain, N/V this AM. He is hoping to advance his diet today.   Objective: Vitals:   06/27/23 1221 06/27/23 1948 06/28/23 0357 06/28/23 0752  BP: 108/60 102/60 (!) 106/51 (!) 119/53  Pulse: 79 80 75 65  Resp: 17 18 18 17   Temp: 98.3 F (36.8 C) 98.4 F (36.9 C) 99.7 F (37.6 C) 99.8 F (37.7 C)  TempSrc: Oral Oral Oral Oral  SpO2: 94% 100% 91% 90%  Weight:      Height:        Intake/Output Summary (Last 24 hours) at 06/28/2023 5784 Last data filed at  06/27/2023 2300 Gross per 24 hour  Intake 200 ml  Output 400 ml  Net -200 ml   Filed Weights   06/21/23 1732 06/24/23 1452 06/27/23 1110  Weight: 87.5 kg 87.5 kg 87.5 kg    Examination: General exam: Appears calm and comfortable, NAD Respiratory system: No work of breathing, symmetric chest wall expansion Cardiovascular system: S1 & S2 heard, RRR.  Gastrointestinal system: Abdomen is nondistended, soft and nontender.  Neuro: Alert and oriented. No focal neurological deficits. Extremities: Symmetric, expected ROM Skin: No rashes, lesions Psychiatry: Demonstrates appropriate judgement and insight. Mood & affect appropriate for situation.   Assessment & Plan:  Principal Problem:   GI bleed Active Problems:   Upper GI bleed   Symptomatic anemia   Essential hypertension   Polyneuropathy   Pure hypercholesterolemia   Benign prostatic hyperplasia without lower urinary tract symptoms   B12 deficiency   Chronic prostatitis   Barrett's esophagus without dysplasia   AVM (arteriovenous malformation) of small bowel, acquired with hemorrhage   ABLA (acute blood loss anemia)  GI bleed Symptomatic anemia - Now status post multiple interventions.  Bleeding has slowed some. Large bloody BM last night. - EGD with Barrett's esophagus - Colonoscopy 11/18 with nonbleeding hemorrhoids, nonbleeding diverticulosis, blood in the colon - Capsule endoscopy 11/19 revealed active bleeding with no obvious source - CTA 11/21: Small bowel bleeding upper abdomen, IR attempted embolization however there is no active extravasation from SMA or distal branches embolization could not be performed -  Patient denied from [facility] and [facility] due to capacity - Single lumen enteroscopy revealing 2 small AVMs which were cauterized - Negative Meckel scan - 11/25 video capsule endoscopy attempted but did not pass down the stomach - Now on octreotide - EGD assisted video capsule endoscopy to bypass the stomach 11/27,  follow results -- Bloody BM 11/27, but Hgb remains stable. -- Advance diet today.  Anemia, acute blood loss - Secondary to above - Regularly getting 1 to 2 units PRBC a day.  Total of ~17 units transfused so far - Hemoglobin currently stable above 7 - Continue Protonix and octreotide - Continue to monitor hemoglobin closely, low threshold for repeat transfusion  AKI - Baseline creatinine appears to be 1.0. Resolving slowly - Status post IV fluids - Continue monitoring BMP - Avoid nephrotoxic meds - Renally dosed for needed  Osteoarthritis - Has been taking Celebrex outpatient for extended period of time.  Reports he recently discontinued prior to the bleeding episode which began - Have counseled discontinue further NSAIDs  Hypertension - Monitor blood pressure closely, unlikely to tolerate significant antihypertensives - Continue current meds  History of SVT - Metoprolol was held and patient developed SVT.  Metoprolol has since been restarted, titrating dose as BP tolerates. -- Currently stable NSR  BPH - Hold home dose tamsulosin given hypotension  B12 deficiency - Continue B12 supplementation  Neuropathy - Continue gabapentin  Prepubic tenderness - Noted at [facility].  Patient is status post cystoscopy with balloon urethral dilation on 11/5  BMI 29 - Outpatient follow up for lifestyle modification and risk factor management   DVT prophylaxis: Hold anticoagulation in setting of GI bleed, SCDs only Code Status: Full Family Communication: Patient's wife present at bedside.  Discussed care plan with both Disposition:  Status is: Inpatient Remains inpatient appropriate because: ongoing workup. Hgb stable this AM despite large bloody BM yesterday. If remains stable and continues to tolerate diet, hopeful for DC within next 48hrs  Consultants:  GI  Antimicrobials:  Anti-infectives (From admission, onward)    None       Data Reviewed: I have personally reviewed  following labs and imaging studies CBC: Recent Labs  Lab 06/23/23 1655 06/23/23 2333 06/24/23 0845 06/24/23 1840 06/26/23 0859 06/26/23 1529 06/26/23 1922 06/27/23 0434 06/28/23 0611  WBC 10.6* 9.6 7.5  --  5.2  --   --   --  3.7*  NEUTROABS  --   --  5.4  --   --   --   --   --  2.2  HGB 6.4* 7.3* 7.1*   < > 7.2* 7.4* 7.4* 7.2* 7.1*  HCT 20.4* 22.6* 22.2*   < > 23.0* 23.9* 23.2* 22.8* 21.9*  MCV 94.4 92.2 93.3  --  94.7  --   --   --  96.9  PLT 116* 114* 115*  --  151  --   --   --  160   < > = values in this interval not displayed.   Basic Metabolic Panel: Recent Labs  Lab 06/22/23 0326 06/23/23 0716 06/23/23 2333 06/26/23 1529 06/28/23 0417  NA 138 134* 133* 138 136  K 4.3 4.5 3.9 4.0 4.8  CL 107 104 103 107 107  CO2 23 22 23 25 22   GLUCOSE 166* 170* 118* 164* 126*  BUN 38* 37* 33* 12 6*  CREATININE 1.42* 1.43* 1.37* 1.20 1.18  CALCIUM 7.1* 7.0* 7.0* 7.7* 7.3*  MG  --   --   --   --  1.9  PHOS  --   --   --   --  3.5   GFR: Estimated Creatinine Clearance: 57.3 mL/min (by C-G formula based on SCr of 1.18 mg/dL). Liver Function Tests: Recent Labs  Lab 06/22/23 0326 06/28/23 0417  AST 14* 30  ALT 8 8  ALKPHOS 26* 45  BILITOT 0.6 1.8*  PROT 3.6* 4.0*  ALBUMIN 2.0* 2.1*   CBG: No results for input(s): "GLUCAP" in the last 168 hours.  No results found for this or any previous visit (from the past 240 hour(s)).   Radiology Studies: DG Abd 1 View  Result Date: 06/26/2023 CLINICAL DATA:  Capsule endoscopy EXAM: ABDOMEN - 1 VIEW COMPARISON:  06/20/2023 FINDINGS: Nonobstructive bowel gas pattern. Two endoscopy capsules are present within the lower abdomen, one of which projects over the right iliac wing, which could be within distal small bowel or at the base of the cecum. Additional capsule projects within the left lower quadrant, expected location of the proximal sigmoid colon. IMPRESSION: Two endoscopy capsules are present within the lower abdomen, one of  which projects over the right iliac wing, which could be within distal small bowel or at the base of the cecum. Additional capsule projects within the left lower quadrant, expected location of the proximal sigmoid colon. Electronically Signed   By: Duanne Guess D.O.   On: 06/26/2023 14:19    Scheduled Meds:  sodium chloride   Intravenous Once   sodium chloride   Intravenous Once   ascorbic acid  500 mg Oral QHS   atorvastatin  20 mg Oral QHS   cholecalciferol  2,000 Units Oral QHS   cyanocobalamin  1,000 mcg Oral QHS   folic acid  2 mg Oral q AM   gabapentin  300 mg Oral BID   lactulose  10 g Oral TID   metoprolol tartrate  12.5 mg Oral BID   octreotide  100 mcg Subcutaneous Q12H   omega-3 acid ethyl esters  1 g Oral BID   pantoprazole  40 mg Intravenous Q12H   sodium chloride flush  3 mL Intravenous Q12H   tamsulosin  0.4 mg Oral Daily   Continuous Infusions:   LOS: 7 days    Time spent:  Debarah Crape, DO [facility]  To contact the attending physician between 7A-7P please use Epic Chat. To contact the covering physician during after hours 7P-7A, please review Amion.   06/28/2023, 8:32 AM

## 2023-06-29 ENCOUNTER — Other Ambulatory Visit (HOSPITAL_COMMUNITY): Payer: Self-pay

## 2023-06-29 ENCOUNTER — Encounter (HOSPITAL_COMMUNITY): Payer: Self-pay | Admitting: Internal Medicine

## 2023-06-29 DIAGNOSIS — D649 Anemia, unspecified: Secondary | ICD-10-CM | POA: Diagnosis not present

## 2023-06-29 DIAGNOSIS — K922 Gastrointestinal hemorrhage, unspecified: Secondary | ICD-10-CM | POA: Diagnosis not present

## 2023-06-29 DIAGNOSIS — K5521 Angiodysplasia of colon with hemorrhage: Secondary | ICD-10-CM | POA: Diagnosis not present

## 2023-06-29 LAB — TYPE AND SCREEN
ABO/RH(D): A POS
Antibody Screen: POSITIVE
DAT, IgG: POSITIVE
Donor AG Type: NEGATIVE
Donor AG Type: NEGATIVE
Unit division: 0
Unit division: 0

## 2023-06-29 LAB — BPAM RBC
Blood Product Expiration Date: 202412202359
Blood Product Expiration Date: 202412202359
ISSUE DATE / TIME: 202411281723
Unit Type and Rh: 5100
Unit Type and Rh: 5100

## 2023-06-29 LAB — CBC WITH DIFFERENTIAL/PLATELET
Abs Immature Granulocytes: 0 10*3/uL (ref 0.00–0.07)
Basophils Absolute: 0 10*3/uL (ref 0.0–0.1)
Basophils Relative: 1 %
Eosinophils Absolute: 0.2 10*3/uL (ref 0.0–0.5)
Eosinophils Relative: 5 %
HCT: 24.1 % — ABNORMAL LOW (ref 39.0–52.0)
Hemoglobin: 7.6 g/dL — ABNORMAL LOW (ref 13.0–17.0)
Immature Granulocytes: 0 %
Lymphocytes Relative: 22 %
Lymphs Abs: 0.9 10*3/uL (ref 0.7–4.0)
MCH: 29.3 pg (ref 26.0–34.0)
MCHC: 31.5 g/dL (ref 30.0–36.0)
MCV: 93.1 fL (ref 80.0–100.0)
Monocytes Absolute: 0.6 10*3/uL (ref 0.1–1.0)
Monocytes Relative: 16 %
Neutro Abs: 2.2 10*3/uL (ref 1.7–7.7)
Neutrophils Relative %: 56 %
Platelets: 169 10*3/uL (ref 150–400)
RBC: 2.59 MIL/uL — ABNORMAL LOW (ref 4.22–5.81)
RDW: 18.5 % — ABNORMAL HIGH (ref 11.5–15.5)
WBC: 3.9 10*3/uL — ABNORMAL LOW (ref 4.0–10.5)
nRBC: 0 % (ref 0.0–0.2)

## 2023-06-29 LAB — MAGNESIUM: Magnesium: 1.9 mg/dL (ref 1.7–2.4)

## 2023-06-29 LAB — COMPREHENSIVE METABOLIC PANEL
ALT: 11 U/L (ref 0–44)
AST: 16 U/L (ref 15–41)
Albumin: 2.2 g/dL — ABNORMAL LOW (ref 3.5–5.0)
Alkaline Phosphatase: 46 U/L (ref 38–126)
Anion gap: 6 (ref 5–15)
BUN: 6 mg/dL — ABNORMAL LOW (ref 8–23)
CO2: 25 mmol/L (ref 22–32)
Calcium: 7.4 mg/dL — ABNORMAL LOW (ref 8.9–10.3)
Chloride: 105 mmol/L (ref 98–111)
Creatinine, Ser: 1.14 mg/dL (ref 0.61–1.24)
GFR, Estimated: >60 mL/min (ref >60)
Glucose, Bld: 137 mg/dL — ABNORMAL HIGH (ref 70–99)
Potassium: 3.9 mmol/L (ref 3.5–5.1)
Sodium: 136 mmol/L (ref 135–145)
Total Bilirubin: 1.3 mg/dL — ABNORMAL HIGH (ref <1.2)
Total Protein: 4.2 g/dL — ABNORMAL LOW (ref 6.5–8.1)

## 2023-06-29 LAB — PHOSPHORUS: Phosphorus: 3.2 mg/dL (ref 2.5–4.6)

## 2023-06-29 MED ORDER — CYCLOBENZAPRINE HCL 5 MG PO TABS: 5.0000 mg | ORAL_TABLET | Freq: Every evening | ORAL | 0 refills | Status: AC | PRN

## 2023-06-29 MED ORDER — FERROUS SULFATE 325 (65 FE) MG PO TABS: 325.0000 mg | ORAL_TABLET | Freq: Every day | ORAL | 0 refills | Status: AC

## 2023-06-29 MED ORDER — OCTREOTIDE ACETATE 100 MCG/ML IJ SOLN: 100.0000 ug | Freq: Two times a day (BID) | INTRAMUSCULAR | 0 refills | Status: DC

## 2023-06-29 MED ORDER — POLYETHYLENE GLYCOL 3350 17 G PO PACK: 17.0000 g | PACK | Freq: Every day | ORAL | 0 refills | Status: AC

## 2023-06-29 MED ORDER — METOPROLOL TARTRATE 25 MG PO TABS: 12.5000 mg | ORAL_TABLET | Freq: Two times a day (BID) | ORAL | 0 refills | Status: AC

## 2023-06-29 NOTE — Discharge Summary (Signed)
Physician Discharge Summary   Patient: Harry Patterson. MRN: [id number] DOB: September 08, 1945  Admit date:     06/21/2023  Discharge date: 06/29/23  Discharge Physician: Debarah Crape   PCP: Lynnea Ferrier, MD   Recommendations at discharge:   Follow up with GI next week Follow up with PCP within 2 weeks  Discharge Diagnoses: Principal Problem:   GI bleed Active Problems:   Upper GI bleed   Symptomatic anemia   Essential hypertension   Polyneuropathy   Pure hypercholesterolemia   Benign prostatic hyperplasia without lower urinary tract symptoms   B12 deficiency   Chronic prostatitis   Barrett's esophagus without dysplasia   AVM (arteriovenous malformation) of small bowel, acquired with hemorrhage   ABLA (acute blood loss anemia)  Resolved Problems:   * No resolved hospital problems. *  Hospital Course: Harry Patterson. is 77 y.o. male with hypertension, neuropathy, hyperlipidemia, alcohol use disorder, B12 deficiency, Barrett's esophagus, BPH, chronic prostatitis who presented as a transfer from [facility].  Patient was initially admitted to [facility] for GI bleed.  He received EGD which revealed Barrett's esophagus but no source of bleeding.  Subsequently underwent colonoscopy which revealed hemorrhoids and diverticulosis but no acute source of bleeding the small amount of blood was seen in the colon.  Capsule endoscopy was also negative.  Patient underwent CTA which showed small upper bleed.  Underwent IR evaluation but there was no obvious extravasations so no embolization was performed.  Was recommended that he proceed with advanced endoscopy but was declined by [facility] and [facility] due to capacity.  He was then transferred to [facility] for single-lumen enteroscopy.  Enteroscopy revealed 2 small AVMs which were cauterized but no obvious source of bleeding.  He was started on octreotide.  He also underwent Meckels scan which was negative.  Video capsule endoscopy testing on 11/25 but  capsule did not pass beyond the stomach.  On 11/27 he underwent EGD assisted video capsule endoscopy to bypass the stomach.  This was reviewed by gastroenterology who reports capsule revealed small bowel AVM that was not actively bleeding.  He likely had SB AVM that led to severe bleeding episode.  He remains high risk for developing more SB AVMs in the future.  He should return urgently if he begins having large-volume bloody stool, or symptomatic anemia.  He was given strict ER return precautions.  Patient was stabilized on subcu octreotide prior to discharge and will need to continue taking this outpatient.  We attempted prior authorization with pharmacy prior to discharge however, we were ultimately unsuccessful.  Patient will follow-up with the GI office who is working on prior authorization outpatient. Mr. Below received 14 units of PRBCs during his admission.  Last transfusion was on 11/28.  Hemoglobin on the day of discharge was 7.6 and patient had no bloody bowel movements 24 hours prior to DC.  His care was discussed with him extensively, as well as with his wife at bedside prior to discharge.  They endorsed feeling ready to go home and have established outpatient follow-up for next week.  Assessment and Plan: GI bleed Symptomatic anemia - EGD with Barrett's esophagus - Colonoscopy 11/18 with nonbleeding hemorrhoids, nonbleeding diverticulosis, blood in the colon - Capsule endoscopy 11/19 revealed active bleeding with no obvious source - CTA 11/21: Small bowel bleeding upper abdomen, IR attempted embolization however there is no active extravasation from SMA or distal branches embolization could not be performed - Single lumen enteroscopy revealing 2 small AVMs  which were cauterized - Negative Meckel scan - 11/25 video capsule endoscopy attempted but did not pass down the stomach - EGD assisted video capsule endoscopy to bypass the stomach 11/27   Anemia, acute blood loss - Secondary to  above - Regularly getting 1 to 2 units PRBC a day.  Total of ~14 units transfused so far - Hemoglobin currently stable above 7 - Continue Protonix and octreotide   AKI - Baseline creatinine appears to be 1.0. Resolving slowly -- Has outpt f/u with nephro   Osteoarthritis - Has been taking Celebrex outpatient for extended period of time.  Reports he recently discontinued prior to the bleeding episode which began - Have counseled discontinue further NSAIDs   Hypertension - Monitor blood pressure closely, unlikely to tolerate significant antihypertensives - Continue current meds   History of SVT - Metoprolol was held and patient developed SVT.  Metoprolol has since been restarted, titrating dose as BP tolerates. -- Currently stable NSR   BPH - Hold home dose tamsulosin given hypotension   B12 deficiency - Continue B12 supplementation   Neuropathy - Continue gabapentin   Prepubic tenderness - Noted at [facility].  Patient is status post cystoscopy with balloon urethral dilation on 11/5   BMI 29 - Outpatient follow up for lifestyle modification and risk factor management   Consultants: GI  Disposition: Home with Home health Diet recommendation:  Discharge Diet Orders (From admission, onward)     Start     Ordered   06/29/23 0000  Diet - low sodium heart healthy        06/29/23 0955           Regular diet DISCHARGE MEDICATION: Allergies as of 06/29/2023       Reactions   Amoxicillin Other (See Comments)   Oral thrush   Other Swelling   Pneumonia vaccine-swollen/painful/fever chills   Amlodipine Other (See Comments)   EDEMA        Medication List     STOP taking these medications    acetaminophen 500 MG tablet Commonly known as: TYLENOL   ALPHA LIPOIC ACID PO   aspirin EC 81 MG tablet   celecoxib 200 MG capsule Commonly known as: CELEBREX   chlorthalidone 25 MG tablet Commonly known as: HYGROTON   diphenoxylate-atropine 2.5-0.025 MG  tablet Commonly known as: LOMOTIL   losartan 100 MG tablet Commonly known as: COZAAR   metoprolol succinate 100 MG 24 hr tablet Commonly known as: TOPROL-XL   spironolactone 100 MG tablet Commonly known as: ALDACTONE       TAKE these medications    ascorbic acid 500 MG tablet Commonly known as: VITAMIN C Take 500 mg by mouth at bedtime.   atorvastatin 20 MG tablet Commonly known as: LIPITOR Take 20 mg by mouth at bedtime.   Cholecalciferol 25 MCG (1000 UT) capsule Take 2,000 Units by mouth at bedtime.   cyanocobalamin 1000 MCG tablet Commonly known as: VITAMIN B12 Take 1,000 mcg by mouth at bedtime.   cyclobenzaprine 5 MG tablet Commonly known as: FLEXERIL Take 1 tablet (5 mg total) by mouth at bedtime as needed for up to 14 days for muscle spasms.   ferrous sulfate 325 (65 FE) MG tablet Take 1 tablet (325 mg total) by mouth daily with breakfast. Start taking on: June 30, 2023   folic acid 1 MG tablet Commonly known as: FOLVITE Take 2 mg by mouth in the morning.   gabapentin 300 MG capsule Commonly known as: NEURONTIN Take 300 mg by  mouth in the morning and at bedtime.   metoprolol tartrate 25 MG tablet Commonly known as: LOPRESSOR Take 0.5 tablets (12.5 mg total) by mouth 2 (two) times daily.   octreotide 100 MCG/ML Soln injection Commonly known as: SANDOSTATIN Inject 1 mL (100 mcg total) into the skin every 12 (twelve) hours for 14 days.   omega-3 acid ethyl esters 1 g capsule Commonly known as: LOVAZA Take 1 g by mouth in the morning and at bedtime.   oxybutynin 5 MG tablet Commonly known as: DITROPAN 1 tab tid prn frequency,urgency, bladder spasm   pantoprazole 40 MG injection Commonly known as: PROTONIX Inject 40 mg into the vein every 12 (twelve) hours.   polyethylene glycol 17 g packet Commonly known as: MIRALAX / GLYCOLAX Take 17 g by mouth daily.   tamsulosin 0.4 MG Caps capsule Commonly known as: FLOMAX Take 1 capsule (0.4 mg  total) by mouth daily.   triamcinolone ointment 0.1 % Commonly known as: KENALOG Apply 1 Application topically 2 (two) times daily as needed (leg irritation/rash.).        Discharge Exam: Filed Weights   06/21/23 1732 06/24/23 1452 06/27/23 1110  Weight: 87.5 kg 87.5 kg 87.5 kg   Constitutional:  Normal appearance. Non toxic-appearing.  HENT: Head Normocephalic and atraumatic.  Mucous membranes are moist.  Eyes:  Extraocular intact. Conjunctivae normal. Pupils are equal, round, and reactive to light.  Cardiovascular: Rate and Rhythm: Normal rate and regular rhythm.  Pulmonary: Non labored, symmetric rise of chest wall.  Musculoskeletal:  Normal range of motion.  Skin: warm and dry. not jaundiced.  Neurological: No focal deficit present. alert. Oriented. Psychiatric: Mood and Affect congruent.    Condition at discharge: stable  The results of significant diagnostics from this hospitalization (including imaging, microbiology, ancillary and laboratory) are listed below for reference.   Imaging Studies: DG Abd 1 View  Result Date: 06/26/2023 CLINICAL DATA:  Capsule endoscopy EXAM: ABDOMEN - 1 VIEW COMPARISON:  06/20/2023 FINDINGS: Nonobstructive bowel gas pattern. Two endoscopy capsules are present within the lower abdomen, one of which projects over the right iliac wing, which could be within distal small bowel or at the base of the cecum. Additional capsule projects within the left lower quadrant, expected location of the proximal sigmoid colon. IMPRESSION: Two endoscopy capsules are present within the lower abdomen, one of which projects over the right iliac wing, which could be within distal small bowel or at the base of the cecum. Additional capsule projects within the left lower quadrant, expected location of the proximal sigmoid colon. Electronically Signed   By: Duanne Guess D.O.   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 FINDINGS: Right Kidney: Renal measurements: 11.2 x 6.2 x 5.3 cm = volume: 202 mL. Echogenicity within normal limits. No mass or hydronephrosis visualized. Multiple simple cortical cysts are seen throughout the right kidney, the largest seen exophytically from the lower pole measuring up to 4.0 cm in greatest dimension. No follow-up imaging is recommended for these lesions. Left Kidney: Renal measurements: 10.5 x 5.9 x 5.1 cm = volume: 165 mL. Echogenicity within normal limits. No mass or hydronephrosis visualized. Simple cortical cyst is seen arising exophytically from the lower pole of the left kidney measuring 5.9 cm in greatest dimension. No follow-up imaging is recommended for this lesion. Bladder: Appears normal for degree  of bladder distention. Bilateral ureteral jets are identified. Other: None. IMPRESSION: 1. Normal renal sonogram. Electronically Signed   By: Helyn Numbers M.D.   On: 06/23/2023 19:18   NM Bowel Img Meckels  Result Date: 06/23/2023 CLINICAL DATA:  Acute gastrointestinal hemorrhage the EXAM: NUCLEAR MEDICINE BOWEL (MECKEL'S) SCAN TECHNIQUE: Sequential abdominal images were obtained following intravenous injection of radiopharmaceutical. RADIOPHARMACEUTICALS:  11.0 mCi Tc-82m pertechnetate IV COMPARISON:  None available. Findings are correlated with prior CT arteriogram of 06/20/2023 FINDINGS: Expected accumulation of radiotracer within the gastric lumen and progressively within the bladder lumen over the course of the examination. No focal uptake of radiotracer to suggest ectopic gastric mucosa within the abdomen. IMPRESSION: No ectopic gastric mucosa identified; negative examination Electronically Signed   By: Helyn Numbers M.D.   On: 06/23/2023 19:12   IR Angiogram Visceral Selective  Result Date: 06/21/2023 INDICATION: small bowel bleed UGIB. Active  extravasation from mid small bowel on CTA EXAM: Procedure: MESENTERIC ARTERIOGRAPHY Including SUPERIOR MESENTERIC ARTERY, JEJUNAL ARTERIAL BRANCHES INTERROGATION COMPARISON:  CT AP, 06/20/2023 and 06/19/2023. MEDICATIONS: None ANESTHESIA/SEDATION: Moderate (conscious) sedation was employed during this procedure. A total of Versed 0.5 mg and Fentanyl 25 mcg was administered intravenously. Moderate Sedation Time: 47 minutes. The patient's level of consciousness and vital signs were monitored continuously by radiology nursing throughout the procedure under my direct supervision. CONTRAST:  105 mL Omnipaque 300 FLUOROSCOPY TIME:  Fluoroscopic dose; 311 mGy COMPLICATIONS: None immediate. PROCEDURE: Informed consent was obtained from the patient and/or patient's representative following explanation of the procedure, risks, benefits and alternatives. All questions were addressed. A time out was performed prior to the initiation of the procedure. Maximal barrier sterile technique utilized including caps, mask, sterile gowns, sterile gloves, large sterile drape, hand hygiene, and sterile prep. The RIGHT femoral head was marked fluoroscopically. Under sterile conditions and local anesthesia, the RIGHT common femoral artery access was performed with a micropuncture needle. Under direct ultrasound guidance, the RIGHT common femoral was accessed with a micropuncture kit. An ultrasound image was saved for documentation purposes. A limited arteriogram was performed through the side arm of the sheath confirming appropriate access within the RIGHT femoral artery. This allowed for placement of a 5 Fr vascular sheath. Over a Bentson wire, a 5 Fr cobra 2 catheter was used to select the superior mesenteric artery. Selective mesenteric angiograms were performed. Despite extensive mesenteric territory vessel interrogation, no active extravasation was identified. Embolization was NOT performed. Images were reviewed and the procedure was  terminated. All wires, catheters and sheaths were removed from the patient. Hemostasis was achieved at the RIGHT groin access site with Angio-Seal closure. The patient remained hemodynamically stable during the procedure, and was without immediate post procedural complication. FINDINGS: *Variant splanchnic arterial anatomy with a replaced common hepatic artery originating from the SMA. *Interrogation of the SMA and distal (jejunal) arterial branches, within the area of noted extravasation on CTA. *No active extravasation was observed. Embolization was NOT performed. IMPRESSION: Mesenteric arteriography in interrogation for upper GI bleed. No active extravasation was observed on catheter angiography. Embolization was NOT performed. PLAN: - The patient is to remain flat for 2 hours with RIGHT leg straight. - if there remains clinical concern for persistent upper GI bleeding, would recommend coordinating with the Vascular and interventional Radiology (VIR) service to perform a CTA abdomen so as to expedite potential repeat angiography if the study is positive. Roanna Banning, MD [facility] Radiology Electronically Signed   By: Gerome Sam.D.  On: 06/21/2023 07:07   CT Angio Abd/Pel w/ and/or w/o  Result Date: 06/20/2023 CLINICAL DATA:  Lower GI bleed. EXAM: CTA ABDOMEN AND PELVIS WITHOUT AND WITH CONTRAST TECHNIQUE: Multidetector CT imaging of the abdomen and pelvis was performed using the standard protocol during bolus administration of intravenous contrast. Multiplanar reconstructed images and MIPs were obtained and reviewed to evaluate the vascular anatomy. RADIATION DOSE REDUCTION: This exam was performed according to the departmental dose-optimization program which includes automated exposure control, adjustment of the mA and/or kV according to patient size and/or use of iterative reconstruction technique. CONTRAST:  OMNIPAQUE IOHEXOL 350 MG/ML SOLN  COMPARISON:  CT dated 06/19/2023. FINDINGS: VASCULAR Aorta: Mild atherosclerotic calcification. No aneurysmal dilatation or dissection. No periaortic fluid collection. Celiac: The celiac trunk and its major branches are patent. SMA: Atherosclerotic calcification of the origin of the SMA. The SMA is patent. The common hepatic artery arises from the SMA. Renals: The renal arteries are patent. IMA: The IMA is patent. Inflow: Mild atherosclerotic calcification. The iliac arteries are patent. No aneurysmal dilatation or dissection. Proximal Outflow: The visualized proximal outflow is patent. Veins: The IVC is unremarkable. The SMV, splenic vein, and main portal vein are patent. No portal venous gas. Review of the MIP images confirms the above findings. NON-VASCULAR Lower chest: The visualized lung bases are clear. There is coronary vascular calcification. No intra-abdominal free air or free fluid. Hepatobiliary: The liver is unremarkable. No biliary dilatation. High attenuating content within the gallbladder, likely vicariously excreted contrast from recent study. Pancreas: Unremarkable. No pancreatic ductal dilatation or surrounding inflammatory changes. Spleen: Normal in size without focal abnormality. Adrenals/Urinary Tract: The adrenal glands are unremarkable. There is no hydronephrosis or nephrolithiasis on either side. Bilateral renal cysts. The visualized ureters and urinary bladder appear unremarkable. Stomach/Bowel: Endoscopy capsule with associated streak artifact in the mid descending colon. There is mild sigmoid diverticulosis without active inflammatory changes. Evaluation for GI bleed is limited due to presence of high attenuating content in the colon. There is however pooling of contrast in a small bowel loop in the upper abdomen to the right of the midline (images 33-39 on axial series 15) consistent with active small-bowel bleed. There is no bowel obstruction. Appendectomy. Lymphatic: No adenopathy.  Reproductive: The prostate and seminal vesicles are grossly unremarkable. Other: Small fat containing left inguinal hernia. No bowel herniation or fluid collection. Musculoskeletal: Degenerative changes of the spine. Bilateral femoral head avascular necrosis, right greater than left. No acute osseous pathology. IMPRESSION: 1. Active small-bowel bleed in the upper abdomen. 2. Endoscopy capsule in the mid descending colon. 3. Mild sigmoid diverticulosis. No bowel obstruction. Electronically Signed   By: Elgie Collard M.D.   On: 06/20/2023 21:36   CT Angio Abd/Pel w/ and/or w/o  Result Date: 06/19/2023 CLINICAL DATA:  Lower GI bleed EXAM: CTA ABDOMEN AND PELVIS WITHOUT AND WITH CONTRAST TECHNIQUE: Multidetector CT imaging of the abdomen and pelvis was performed using the standard protocol during bolus administration of intravenous contrast. Multiplanar reconstructed images and MIPs were obtained and reviewed to evaluate the vascular anatomy. RADIATION DOSE REDUCTION: This exam was performed according to the departmental dose-optimization program which includes automated exposure control, adjustment of the mA and/or kV according to patient size and/or use of iterative reconstruction technique. CONTRAST:  OMNIPAQUE IOHEXOL 350 MG/ML SOLN COMPARISON:  None Available. FINDINGS: VASCULAR Aorta: Normal caliber aorta without aneurysm, dissection, vasculitis or significant stenosis. Mild scattered atherosclerotic plaque. Celiac: Patent without evidence of aneurysm, dissection, vasculitis or significant stenosis.  The common hepatic artery is replaced to the SMA. SMA: Patent without evidence of aneurysm, dissection, vasculitis or significant stenosis. Replaced common hepatic artery. Renals: Both renal arteries are patent without evidence of aneurysm, dissection, vasculitis, fibromuscular dysplasia or significant stenosis. IMA: Patent without evidence of aneurysm, dissection, vasculitis or significant stenosis.  Inflow: Patent without evidence of aneurysm, dissection, vasculitis or significant stenosis. Proximal Outflow: Bilateral common femoral and visualized portions of the superficial and profunda femoral arteries are patent without evidence of aneurysm, dissection, vasculitis or significant stenosis. Veins: No focal venous abnormality. Review of the MIP images confirms the above findings. NON-VASCULAR Lower chest: The intracardiac blood pool is hypodense relative to the adjacent myocardium consistent with anemia. Hepatobiliary: No focal liver abnormality is seen. No gallstones, gallbladder wall thickening, or biliary dilatation. Pancreas: Unremarkable. No pancreatic ductal dilatation or surrounding inflammatory changes. Spleen: Normal in size without focal abnormality. Adrenals/Urinary Tract: Normal adrenal glands. No hydronephrosis, nephrolithiasis or enhancing renal mass. Multiple water attenuation renal lesions bilaterally consistent with simple cysts. The ureters and bladder are unremarkable. Stomach/Bowel: No evidence of extravasation of contrast material into the stomach, large bowel or small bowel. An endoscopic capsule is present within the cecum. No focal bowel wall thickening or evidence of obstruction. Lymphatic: No suspicious lymphadenopathy. Reproductive: Prostate is unremarkable. Other: Fat containing left inguinal hernia. Musculoskeletal: No acute fracture or aggressive appearing lytic or blastic osseous lesion. IMPRESSION: 1. Negative for active GI bleed at this time. 2. The intracardiac blood pool is hypodense relative to the adjacent myocardium consistent with anemia. 3. Endoscopic capsule present within the cecum. 4. No focal bowel wall thickening, significant diverticulosis or evidence of obstruction. 5. Omental fat containing left inguinal hernia. 6. Common hepatic artery is replaced to the SMA. Aortic Atherosclerosis (ICD10-I70.0). Signed, Sterling Big, MD, RPVI [facility] Radiology Electronically Signed   By: Malachy Moan M.D.   On: 06/19/2023 16:02   DG Chest Port 1 View  Result Date: 06/16/2023 CLINICAL DATA:  Dyspnea on exertion EXAM: PORTABLE CHEST 1 VIEW COMPARISON:  02/09/2017 FINDINGS: Single frontal view of the chest demonstrates an unremarkable cardiac silhouette. No airspace disease, effusion, or pneumothorax. No acute bony abnormalities. IMPRESSION: 1. No acute intrathoracic process. Electronically Signed   By: Sharlet Salina M.D.   On: 06/16/2023 17:57    Microbiology: Results for orders placed or performed in visit on 05/25/23  Microscopic Examination     Status: None   Collection Time: 05/25/23  8:11 AM   Urine  Result Value Ref Range Status   WBC, UA 0-5 0 - 5 /hpf Final   RBC, Urine None seen 0 - 2 /hpf Final   Epithelial Cells (non renal) 0-10 0 - 10 /hpf Final   Bacteria, UA None seen None seen/Few Final    Labs: CBC: Recent Labs  Lab 06/23/23 2333 06/24/23 0845 06/24/23 1840 06/26/23 0859 06/26/23 1529 06/26/23 1922 06/27/23 0434 06/28/23 0611 06/29/23 0534  WBC 9.6 7.5  --  5.2  --   --   --  3.7* 3.9*  NEUTROABS  --  5.4  --   --   --   --   --  2.2 2.2  HGB 7.3* 7.1*   < > 7.2* 7.4* 7.4* 7.2* 7.1* 7.6*  HCT 22.6* 22.2*   < > 23.0* 23.9* 23.2* 22.8* 21.9* 24.1*  MCV 92.2 93.3  --  94.7  --   --   --  96.9 93.1  PLT 114* 115*  --  151  --   --   --  160 169   < > = values in this interval not displayed.   Basic Metabolic Panel: Recent Labs  Lab 06/23/23 0716 06/23/23 2333 06/26/23 1529 06/28/23 0417 06/29/23 0534  NA 134* 133* 138 136 136  K 4.5 3.9 4.0 4.8 3.9  CL 104 103 107 107 105  CO2 22 23 25 22 25   GLUCOSE 170* 118* 164* 126* 137*  BUN 37* 33* 12 6* 6*  CREATININE 1.43* 1.37* 1.20 1.18 1.14  CALCIUM 7.0* 7.0* 7.7* 7.3* 7.4*  MG  --   --   --  1.9 1.9  PHOS  --   --   --  3.5 3.2   Liver Function Tests: Recent Labs  Lab 06/28/23 0417 06/29/23 0534  AST 30 16   ALT 8 11  ALKPHOS 45 46  BILITOT 1.8* 1.3*  PROT 4.0* 4.2*  ALBUMIN 2.1* 2.2*   CBG: No results for input(s): "GLUCAP" in the last 168 hours.  Discharge time spent: greater than 30 minutes.  Signed: Debarah Crape, DO [facility] 06/29/2023

## 2023-06-29 NOTE — Progress Notes (Signed)
     [facility] Progress Note  CC:  transfer from [facility] with persistent acute GI bleed   Subjective:  No further sign of bleeding.  Received a unit of PRBCs yesterday.  Ready to go home.  Objective:  Vital signs in last 24 hours: Temp:  [98.3 F (36.8 C)-99.8 F (37.7 C)] 98.3 F (36.8 C) (11/29 0835) Pulse Rate:  [69-81] 76 (11/29 0835) Resp:  [16-18] 17 (11/29 0835) BP: (103-132)/(53-67) 103/60 (11/29 0835) SpO2:  [89 %-100 %] 92 % (11/29 0835) Last BM Date : 06/28/23 General:  Alert, Well-developed, in NAD  Intake/Output from previous day: 11/28 0701 - 11/29 0700 In: 522.1 [P.O.:120; Blood:402.1] Out: -   Lab Results: Recent Labs    06/27/23 0434 06/28/23 0611 06/29/23 0534  WBC  --  3.7* 3.9*  HGB 7.2* 7.1* 7.6*  HCT 22.8* 21.9* 24.1*  PLT  --  160 169   BMET Recent Labs    06/26/23 1529 06/28/23 0417 06/29/23 0534  NA 138 136 136  K 4.0 4.8 3.9  CL 107 107 105  CO2 25 22 25   GLUCOSE 164* 126* 137*  BUN 12 6* 6*  CREATININE 1.20 1.18 1.14  CALCIUM 7.7* 7.3* 7.4*   LFT Recent Labs    06/29/23 0534  PROT 4.2*  ALBUMIN 2.2*  AST 16  ALT 11  ALKPHOS 46  BILITOT 1.3*   Assessment / Plan: #76 77 year old white male with acute major GI bleed now status post 15 units of packed RBCs EGD and colonoscopy without source of bleeding found/done at [facility] prior to transfer Capsule endoscopy prior to transfer showed brisk active bleeding and what appeared to be the mid small bowel, and there was also question of additional bleeding site in the distal small bowel within a couple of minutes of the cecum   Transferred here for single balloon Single balloon enteroscopy 06/22/2023, no source found, no active bleeding, 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 Jejunal tattoo  seen between 01:53:20 and 04:03:32. 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 Maroon colored stool seen in the cecum.    Continuing SQ Octreotide.   Has not had any gross evidence of bleeding for the last 5 days or so.  Hgb has remained stable for the last few days, but did receive another unit of PRBCs yesterday, 11/28.  Hgb 7.6 grams today.  #2 history of B12 deficiency #3.  History of hypertension #4.  Barrett's esophagus #5.  Osteoarthritis-had been on Celebrex chronically, prior to admission , his wife had stopped that a couple of weeks ago  -If patient has recurrent bleeding in the future, then would perform SBE to target proximal small bowel angioectasia.  -Our office will have to work on the authorization oft he SQ octreotide.  Will arrange for follow-up in our office as well.   LOS: 8 days   Princella Pellegrini. Wilbert Schouten  06/29/2023, 9:23 AM

## 2023-06-29 NOTE — Hospital Course (Signed)
Harry Patterson. is 77 y.o. male with hypertension, neuropathy, hyperlipidemia, alcohol use disorder, B12 deficiency, Barrett's esophagus, BPH, chronic prostatitis who presented as a transfer from [facility].  Patient was initially admitted to [facility] for GI bleed.  He received EGD which revealed Barrett's esophagus but no source of bleeding.  Subsequently underwent colonoscopy which revealed hemorrhoids and diverticulosis but no acute source of bleeding the small amount of blood was seen in the colon.  Capsule endoscopy was also negative.  Patient underwent CTA which showed small upper bleed.  Underwent IR evaluation but there was no obvious extravasations so no embolization was performed.  Was recommended that he proceed with advanced endoscopy but was declined by [facility] due to capacity.  He was then transferred to [facility] for single-lumen enteroscopy.  Enteroscopy revealed 2 small AVMs which were cauterized but no obvious source of bleeding.  He was started on octreotide.  He also underwent Meckels scan which was negative.  Video capsule endoscopy testing on 11/25 but capsule did not pass beyond the stomach.  On 11/27 he underwent EGD assisted video capsule endoscopy to bypass the stomach.  This was reviewed by gastroenterology who reports capsule revealed small bowel AVM that was not actively bleeding.  He likely had SB AVM that led to severe bleeding episode.  He remains high risk for developing more SB AVMs in the future.  He should return urgently if he begins having large-volume bloody stool, or symptomatic anemia.  He was given strict ER return precautions.  Patient was stabilized on subcu octreotide prior to discharge and will need to continue taking this outpatient.  We attempted prior authorization with pharmacy prior to discharge however, we were ultimately unsuccessful.  Patient will follow-up with the GI office who is working on prior authorization outpatient. Mr. Favret received 14 units of  PRBCs during his admission.  Last transfusion was on 11/28.  Hemoglobin on the day of discharge was 7.6 and patient had no bloody bowel movements 24 hours prior to DC.  His care was discussed with him extensively, as well as with his wife at bedside prior to discharge.  They endorsed feeling ready to go home and have established outpatient follow-up for next week.

## 2023-06-29 NOTE — Plan of Care (Signed)
  Problem: Education: Goal: Knowledge of General Education information will improve Description: Including pain rating scale, medication(s)/side effects and non-pharmacologic comfort measures 06/29/2023 0453 by Amy Gothard, RN Outcome: Progressing 06/29/2023 0453 by Javarius Tsosie, RN Outcome: Not Progressing   Problem: Health Behavior/Discharge Planning: Goal: Ability to manage health-related needs will improve 06/29/2023 0453 by Izel Hochberg, RN Outcome: Progressing 06/29/2023 0453 by Cherika Jessie, RN Outcome: Not Progressing   Problem: Clinical Measurements: Goal: Ability to maintain clinical measurements within normal limits will improve 06/29/2023 0453 by Demari Gales, RN Outcome: Progressing 06/29/2023 0453 by Jacen Carlini, RN Outcome: Not Progressing Goal: Will remain free from infection 06/29/2023 0453 by China Deitrick, RN Outcome: Progressing 06/29/2023 0453 by Haja Crego, RN Outcome: Not Progressing Goal: Diagnostic test results will improve 06/29/2023 0453 by Yaqub Arney, RN Outcome: Progressing 06/29/2023 0453 by Siarah Deleo, RN Outcome: Not Progressing Goal: Respiratory complications will improve 06/29/2023 0453 by Eual Lindstrom, Eimy Plaza, RN Outcome: Progressing 06/29/2023 0453 by Hajira Verhagen, RN Outcome: Not Progressing Goal: Cardiovascular complication will be avoided 06/29/2023 0453 by Savon Bordonaro, RN Outcome: Progressing 06/29/2023 0453 by Keo Schirmer, RN Outcome: Not Progressing   Problem: Activity: Goal: Risk for activity intolerance will decrease 06/29/2023 0453 by Dacota Ruben, RN Outcome: Progressing 06/29/2023 0453 by Jessejames Steelman, RN Outcome: Not Progressing   Problem: Nutrition: Goal: Adequate nutrition will be maintained 06/29/2023 0453 by Ismeal Heider, RN Outcome: Progressing 06/29/2023 0453 by Joaopedro Eschbach, RN Outcome: Not Progressing   Problem: Coping: Goal: Level of anxiety will decrease 06/29/2023 0453 by Kimari Coudriet, RN Outcome: Progressing 06/29/2023 0453 by Stevens Magwood,  Nastacia Raybuck, RN Outcome: Not Progressing   Problem: Elimination: Goal: Will not experience complications related to bowel motility 06/29/2023 0453 by Melisse Caetano, RN Outcome: Progressing 06/29/2023 0453 by Scottie Stanish, RN Outcome: Not Progressing Goal: Will not experience complications related to urinary retention 06/29/2023 0453 by Treydon Henricks, RN Outcome: Progressing 06/29/2023 0453 by Drae Mitzel, RN Outcome: Not Progressing   Problem: Pain Management: Goal: General experience of comfort will improve 06/29/2023 0453 by Jaylon Boylen, RN Outcome: Progressing 06/29/2023 0453 by Leasa Kincannon, RN Outcome: Not Progressing   Problem: Safety: Goal: Ability to remain free from injury will improve 06/29/2023 0453 by Ramsie Ostrander, RN Outcome: Progressing 06/29/2023 0453 by Tion Tse, RN Outcome: Not Progressing   Problem: Skin Integrity: Goal: Risk for impaired skin integrity will decrease 06/29/2023 0453 by Jovan Schickling, RN Outcome: Progressing 06/29/2023 0453 by Clois Dupes, RN Outcome: Not Progressing

## 2023-06-29 NOTE — Progress Notes (Signed)
Mobility Specialist Progress Note:    06/29/23 0830  Mobility  Activity Ambulated with assistance in hallway  Level of Assistance Standby assist, set-up cues, supervision of patient - no hands on  Assistive Device Front wheel walker (didn't need it, would hold it abov the floor)  Distance Ambulated (ft) 250 ft  Activity Response Tolerated well  Mobility Referral Yes  $Mobility charge 1 Mobility  Mobility Specialist Start Time (ACUTE ONLY) 0830  Mobility Specialist Stop Time (ACUTE ONLY) 0840  Mobility Specialist Time Calculation (min) (ACUTE ONLY) 10 min   Pt received ambulating from bathroom with wife. Agreeable to mobility session. Ambulated in hallway with RW, not necessary. Pt would hold RW above floor despite cues, suggested no AD but pt declined. Tolerated well, asx throughout and no c/o dizziness. Returned pt to room, all needs met, wife at bedside.   Feliciana Rossetti Mobility Specialist Please contact via Special educational needs teacher or  Rehab office at [phone]

## 2023-07-02 ENCOUNTER — Other Ambulatory Visit (INDEPENDENT_AMBULATORY_CARE_PROVIDER_SITE_OTHER): Payer: BC Managed Care – PPO

## 2023-07-02 ENCOUNTER — Other Ambulatory Visit: Payer: Self-pay

## 2023-07-02 ENCOUNTER — Telehealth: Payer: Self-pay | Admitting: *Deleted

## 2023-07-02 ENCOUNTER — Telehealth: Payer: Self-pay | Admitting: Gastroenterology

## 2023-07-02 DIAGNOSIS — K922 Gastrointestinal hemorrhage, unspecified: Secondary | ICD-10-CM

## 2023-07-02 LAB — CBC WITH DIFFERENTIAL/PLATELET
Basophils Absolute: 0 10*3/uL (ref 0.0–0.1)
Basophils Relative: 0.4 % (ref 0.0–3.0)
Eosinophils Absolute: 0.2 10*3/uL (ref 0.0–0.7)
Eosinophils Relative: 5.8 % — ABNORMAL HIGH (ref 0.0–5.0)
HCT: 28.1 % — ABNORMAL LOW (ref 39.0–52.0)
Hemoglobin: 9.2 g/dL — ABNORMAL LOW (ref 13.0–17.0)
Lymphocytes Relative: 20.4 % (ref 12.0–46.0)
Lymphs Abs: 0.7 10*3/uL (ref 0.7–4.0)
MCHC: 32.6 g/dL (ref 30.0–36.0)
MCV: 91.5 fL (ref 78.0–100.0)
Monocytes Absolute: 0.5 10*3/uL (ref 0.1–1.0)
Monocytes Relative: 12.8 % — ABNORMAL HIGH (ref 3.0–12.0)
Neutro Abs: 2.2 10*3/uL (ref 1.4–7.7)
Neutrophils Relative %: 60.6 % (ref 43.0–77.0)
Platelets: 224 10*3/uL (ref 150.0–400.0)
RBC: 3.07 Mil/uL — ABNORMAL LOW (ref 4.22–5.81)
RDW: 17.6 % — ABNORMAL HIGH (ref 11.5–15.5)
WBC: 3.6 10*3/uL — ABNORMAL LOW (ref 4.0–10.5)

## 2023-07-02 NOTE — Telephone Encounter (Signed)
-----   Message from Shellia Cleverly sent at 07/02/2023 10:08 AM EST ----- Thanks for taking care of him last week! Glad that bleed resolved and hopefully no recurrence!  Dottie, can you please arrange for repeat CBC later this week and f/u with me or Amy in the office in the next 4 weeks or so? Thanks. ----- Message ----- From: Imogene Burn, MD Sent: 06/28/2023  12:59 PM EST To: Shellia Cleverly, DO  Hi Harry Patterson, just so that you are aware, patient will likely be discharged today or tomorrow. VCE showed a proximal SB angioectasia but otherwise was unremarkable. I think most likely he had a SB angioectasia bleed that has now resolved. Patient's Hb has stayed stable since 11/23. If he rebleeds in the future, can pursue SBE. I think he needs a Hb recheck in 1 week and will need GI clinic follow up in maybe 1 month. Patient preferred to continue to be seen with [facility]. Would you be able to ask your nurse to arrange for this? Feel free to change time intervals for appts in case you have specific preferences.

## 2023-07-02 NOTE — Telephone Encounter (Signed)
PT is calling to have a refill sent [facility]. He has been advised it needs a PA with [employer].

## 2023-07-02 NOTE — Telephone Encounter (Signed)
Spoke to patient's wife, Erskine Squibb (ok per ROI) to advise of patient need for labs this week at our facility and of appointment scheduled with Dr Sabra Heck for hospital follow up on 08/02/23 at 240 pm. Floyce Stakes understanding of information and denies any additional questions.

## 2023-07-03 ENCOUNTER — Other Ambulatory Visit: Payer: Self-pay

## 2023-07-03 DIAGNOSIS — K922 Gastrointestinal hemorrhage, unspecified: Secondary | ICD-10-CM

## 2023-07-03 MED ORDER — OCTREOTIDE ACETATE 100 MCG/ML IJ SOLN: 100.0000 ug | Freq: Two times a day (BID) | INTRAMUSCULAR | 3 refills | Status: DC

## 2023-07-03 NOTE — Telephone Encounter (Signed)
Can you please specify how long patient should be using octreotide?  Please advise.

## 2023-07-03 NOTE — Addendum Note (Signed)
Addended by: Lamona Curl on: 07/03/2023 03:34 PM   Modules accepted: Orders

## 2023-07-03 NOTE — Addendum Note (Signed)
Addended by: Lamona Curl on: 07/03/2023 04:01 PM   Modules accepted: Orders

## 2023-07-03 NOTE — Telephone Encounter (Signed)
Octreotide will need a prior authorization completed.

## 2023-07-05 ENCOUNTER — Encounter: Payer: Self-pay | Admitting: Urology

## 2023-07-05 ENCOUNTER — Ambulatory Visit: Payer: BC Managed Care – PPO | Admitting: Urology

## 2023-07-05 VITALS — BP 106/58 | HR 83 | Ht 68.0 in | Wt 192.0 lb

## 2023-07-05 DIAGNOSIS — Z09 Encounter for follow-up examination after completed treatment for conditions other than malignant neoplasm: Secondary | ICD-10-CM | POA: Diagnosis not present

## 2023-07-05 DIAGNOSIS — Z87448 Personal history of other diseases of urinary system: Secondary | ICD-10-CM

## 2023-07-05 DIAGNOSIS — N35919 Unspecified urethral stricture, male, unspecified site: Secondary | ICD-10-CM

## 2023-07-05 NOTE — Progress Notes (Signed)
I, Maysun Anabel Bene, acting as a scribe for Riki Altes, MD., have documented all relevant documentation on the behalf of Riki Altes, MD, as directed by Riki Altes, MD while in the presence of Riki Altes, MD.  07/05/2023 10:21 AM   Harry Patterson. [birth year] [id number]  Referring provider: Lynnea Ferrier, MD [address] [facility] [place],  [place] [address]  Chief Complaint  Patient presents with   Benign Prostatic Hypertrophy   Urologic history: 1.  Bulbar urethral stricture Balloon dilation  HPI: Harry Patterson. is a 77 y.o. male presents for a post-op follow-up.  Status post balloon dilation of a bulbar urethral stricture 06/05/23.  He was hospitalized 11/16 with an upper GI bleed and required 9 unit transfusion. He was unable to be transferred to [facility] or [facility] due to bed status. He was subsequently transferred to [facility] for consideration of single lumen balloon enteroscopy. He was found to have 2 small AVMs which were cauterized, but no obvious source of bleeding was identified.  From a urologic standpoint, all of his voiding symptoms have resolved and he is voiding with an excellent stream and has no complaints.    PMH: Past Medical History:  Diagnosis Date   Adenomatous colon polyp    Allergic rhinitis    Arthritis    Avascular necrosis of bones of both hips (HCC)    B12 deficiency    Barrett's esophagus without dysplasia    Bilateral inguinal hernia    Bilateral renal cysts    CAD (coronary artery disease)    Chronic prostatitis    DDD (degenerative disc disease), lumbar    Enlarged prostate    Erectile dysfunction    a.) on PDE5i (sildenafil)   GERD (gastroesophageal reflux disease)    History of bilateral cataract extraction 2020   HLD (hyperlipidemia)    HTN (hypertension)    Hypertension    LBBB (left bundle branch block)    Leukopenia    Long term current use of aspirin    OSA on CPAP     Peripheral neuropathy    Prediabetes    Skin cancer    Substance abuse (HCC)    Vitamin D deficiency     Surgical History: Past Surgical History:  Procedure Laterality Date   APPENDECTOMY     BALLOON ENTEROSCOPY N/A 06/22/2023   Procedure: BALLOON ENTEROSCOPY;  Surgeon: Shellia Cleverly, DO;  Location: [facility] ENDOSCOPY;  Service: Gastroenterology;  Laterality: N/A;   BIOPSY  01/16/2023   Procedure: BIOPSY;  Surgeon: Regis Bill, MD;  Location: [facility] ENDOSCOPY;  Service: Endoscopy;;   CATARACT EXTRACTION W/PHACO Right 06/11/2019   Procedure: CATARACT EXTRACTION PHACO AND INTRAOCULAR LENS PLACEMENT (IOC) RIGHT 00:54.6     22.0%    12.00;  Surgeon: Lockie Mola, MD;  Location: [facility] SURGERY CN[facility];  Service: Ophthalmology;  Laterality: Right;  sleep apnea   CATARACT EXTRACTION W/PHACO Left 06/30/2019   Procedure: CATARACT EXTRACTION PHACO AND INTRAOCULAR LENS PLACEMENT (IOC) LEFT TORIC LENS 9.97  01:09.1  14.4%;  Surgeon: Lockie Mola, MD;  Location: [facility]ERY [facility];  Service: Ophthalmology;  Laterality: Left;   COLONOSCOPY N/A 06/18/2023   Procedure: COLONOSCOPY;  Surgeon: Toledo, Boykin Nearing, MD;  Location: [facility] ENDOSCOPY;  Service: Gastroenterology;  Laterality: N/A;   COLONOSCOPY W/ POLYPECTOMY     02/08/00,11'12'04,05/05/05, 08/21/2008, 12/05/2013, 05/08/2014   COLONOSCOPY WITH PROPOFOL N/A 06/06/2017   Procedure: COLONOSCOPY WITH PROPOFOL;  Surgeon: Mechele Collin,  Wilber Bihari, MD;  Location: [facility] ENDOSCOPY;  Service: Endoscopy;  Laterality: N/A;   COLONOSCOPY WITH PROPOFOL N/A 01/16/2023   Procedure: COLONOSCOPY WITH PROPOFOL;  Surgeon: Regis Bill, MD;  Location: [facility] ENDOSCOPY;  Service: Endoscopy;  Laterality: N/A;   cyst removed  Right    thumb   CYSTOSCOPY WITH URETHRAL DILATATION N/A 06/05/2023   Procedure: CYSTOSCOPY WITH URETHRAL BALLOON DILATATION USING OPTILUME;  Surgeon: Riki Altes, MD;  Location: [facility] ORS;  Service: Urology;  Laterality: N/A;    ESOPHAGOGASTRODUODENOSCOPY     x7   ESOPHAGOGASTRODUODENOSCOPY N/A 06/17/2023   Procedure: ESOPHAGOGASTRODUODENOSCOPY (EGD);  Surgeon: Toledo, Boykin Nearing, MD;  Location: [facility] ENDOSCOPY;  Service: Gastroenterology;  Laterality: N/A;   ESOPHAGOGASTRODUODENOSCOPY (EGD) WITH PROPOFOL N/A 06/06/2017   Procedure: ESOPHAGOGASTRODUODENOSCOPY (EGD) WITH PROPOFOL;  Surgeon: Scot Jun, MD;  Location: [facility] ENDOSCOPY;  Service: Endoscopy;  Laterality: N/A;   ESOPHAGOGASTRODUODENOSCOPY (EGD) WITH PROPOFOL N/A 11/27/2019   Procedure: ESOPHAGOGASTRODUODENOSCOPY (EGD) WITH PROPOFOL;  Surgeon: Toledo, Boykin Nearing, MD;  Location: [facility] ENDOSCOPY;  Service: Gastroenterology;  Laterality: N/A;   ESOPHAGOGASTRODUODENOSCOPY (EGD) WITH PROPOFOL N/A 01/16/2023   Procedure: ESOPHAGOGASTRODUODENOSCOPY (EGD) WITH PROPOFOL;  Surgeon: Regis Bill, MD;  Location: [facility] ENDOSCOPY;  Service: Endoscopy;  Laterality: N/A;   ESOPHAGOGASTRODUODENOSCOPY (EGD) WITH PROPOFOL N/A 06/27/2023   Procedure: ESOPHAGOGASTRODUODENOSCOPY (EGD) WITH PROPOFOL;  Surgeon: Imogene Burn, MD;  Location: [facility] ENDOSCOPY;  Service: Gastroenterology;  Laterality: N/A;   GIVENS CAPSULE STUDY N/A 06/24/2023   Procedure: GIVENS CAPSULE STUDY;  Surgeon: Shellia Cleverly, DO;  Location: [facility] ENDOSCOPY;  Service: Gastroenterology;  Laterality: N/A;   GIVENS CAPSULE STUDY N/A 06/27/2023   Procedure: GIVENS CAPSULE STUDY;  Surgeon: Imogene Burn, MD;  Location: [facility] ENDOSCOPY;  Service: Gastroenterology;  Laterality: N/A;   HEMORRHOID SURGERY     HOT HEMOSTASIS N/A 06/22/2023   Procedure: HOT HEMOSTASIS (ARGON PLASMA COAGULATION/BICAP);  Surgeon: Shellia Cleverly, DO;  Location: [facility] ENDOSCOPY;  Service: Gastroenterology;  Laterality: N/A;   IR ANGIOGRAM VISCERAL SELECTIVE  06/21/2023   SUBMUCOSAL TATTOO INJECTION  06/22/2023   Procedure: SUBMUCOSAL TATTOO INJECTION;  Surgeon: Shellia Cleverly, DO;  Location: [facility] ENDOSCOPY;  Service:  Gastroenterology;;   TONSILLECTOMY      Home Medications:  Allergies as of 07/05/2023       Reactions   Amoxicillin Other (See Comments)   Oral thrush   Other Swelling   Pneumonia vaccine-swollen/painful/fever chills   Amlodipine Other (See Comments)   EDEMA        Medication List        Accurate as of July 05, 2023 10:21 AM. If you have any questions, ask your nurse or doctor.          STOP taking these medications    oxybutynin 5 MG tablet Commonly known as: DITROPAN Stopped by: Riki Altes       TAKE these medications    ascorbic acid 500 MG tablet Commonly known as: VITAMIN C Take 500 mg by mouth at bedtime.   atorvastatin 20 MG tablet Commonly known as: LIPITOR Take 20 mg by mouth at bedtime.   Cholecalciferol 25 MCG (1000 UT) capsule Take 2,000 Units by mouth at bedtime.   cyanocobalamin 1000 MCG tablet Commonly known as: VITAMIN B12 Take 1,000 mcg by mouth at bedtime.   cyclobenzaprine 5 MG tablet Commonly known as: FLEXERIL Take 1 tablet (5 mg total) by mouth at bedtime as needed for up to 14 days for muscle spasms.   ferrous sulfate  325 (65 FE) MG tablet Take 1 tablet (325 mg total) by mouth daily with breakfast.   folic acid 1 MG tablet Commonly known as: FOLVITE Take 2 mg by mouth in the morning.   gabapentin 300 MG capsule Commonly known as: NEURONTIN Take 300 mg by mouth in the morning and at bedtime.   metoprolol tartrate 25 MG tablet Commonly known as: LOPRESSOR Take 0.5 tablets (12.5 mg total) by mouth 2 (two) times daily.   octreotide 100 MCG/ML Soln injection Commonly known as: SANDOSTATIN Inject 1 mL (100 mcg total) into the skin every 12 (twelve) hours.   omega-3 acid ethyl esters 1 g capsule Commonly known as: LOVAZA Take 1 g by mouth in the morning and at bedtime.   pantoprazole 40 MG injection Commonly known as: PROTONIX Inject 40 mg into the vein every 12 (twelve) hours.   polyethylene glycol 17 g  packet Commonly known as: MIRALAX / GLYCOLAX Take 17 g by mouth daily.   tamsulosin 0.4 MG Caps capsule Commonly known as: FLOMAX Take 1 capsule (0.4 mg total) by mouth daily.   triamcinolone ointment 0.1 % Commonly known as: KENALOG Apply 1 Application topically 2 (two) times daily as needed (leg irritation/rash.).        Allergies:  Allergies  Allergen Reactions   Amoxicillin Other (See Comments)    Oral thrush   Other Swelling    Pneumonia vaccine-swollen/painful/fever chills   Amlodipine Other (See Comments)    EDEMA    Family History: Family History  Problem Relation Age of Onset   Heart disease Mother     Social History:  reports that he has never smoked. He has never used smokeless tobacco. He reports current alcohol use of about 2.0 standard drinks of alcohol per week. He reports that he does not use drugs.   Physical Exam: BP (!) 106/58   Pulse 83   Ht 5\' 8"  (1.727 m)   Wt 192 lb (87.1 kg)   BMI 29.19 kg/m   Constitutional:  Alert and oriented, No acute distress. HEENT: [place] AT Respiratory: Normal respiratory effort, no increased work of breathing. Psychiatric: Normal mood and affect.   Assessment & Plan:    1. Bulbar urethra stricture Doing well status post balloon dilation He was instructed to call should he have recurrent obstructive voiding symptoms and will follow up in 1 year.   I have reviewed the above documentation for accuracy and completeness, and I agree with the above.   Riki Altes, MD  [facility] [address] [place], [place] [address] [phone]

## 2023-07-10 ENCOUNTER — Telehealth: Payer: Self-pay | Admitting: Gastroenterology

## 2023-07-10 DIAGNOSIS — D649 Anemia, unspecified: Secondary | ICD-10-CM

## 2023-07-10 DIAGNOSIS — T189XXS Foreign body of alimentary tract, part unspecified, sequela: Secondary | ICD-10-CM

## 2023-07-10 NOTE — Telephone Encounter (Signed)
Patient's wife called and stated that her husband had blood work done at [facility] and his hemoglobin was 9.2. Spouse also stated that her husband has 3 camera in his stomach and she would like to know if she can get a order for an x-ray to find the status of the camera's. Patient spouse also stated that at the hospital her husband was receiving injection and that  they wanted him to continue with the subcutaneous injections because they help with his bleeding. And that her husband was discharged the day after thanksgiving. Spouse is requesting a call back at [phone]. Please advise.

## 2023-07-11 ENCOUNTER — Telehealth: Payer: Self-pay | Admitting: Pharmacy Technician

## 2023-07-11 ENCOUNTER — Other Ambulatory Visit (HOSPITAL_COMMUNITY): Payer: Self-pay

## 2023-07-11 NOTE — Telephone Encounter (Signed)
Left message for patient to call back  

## 2023-07-11 NOTE — Telephone Encounter (Signed)
Patients wife returned call, please advise.

## 2023-07-11 NOTE — Telephone Encounter (Signed)
Pharmacy Patient Advocate Encounter   Received notification from Physician's Office that prior authorization for Octreotide Acetate 100MCG/ML syringes is required/requested.   Insurance verification completed.   The patient is insured through [facility] .   Per test claim: PA required; PA submitted to above mentioned insurance via Fax Key/confirmation #/EOC [id number] Status is pending   Unable to submit via CMM, or BlueE. Had to submit on paper form and fax.

## 2023-07-11 NOTE — Telephone Encounter (Signed)
Patient's wife would like to let Dr Barron Alvine know that patient had labwork completed at [facility] and he had stable hemoglobin. She also would like to see if Dr Barron Alvine would order an abdominal xray to confirm if video capsules have passed. Wife does state that patient is having normal bowel movements, is passing flatus and is feeling much better at this time.  Wife also notes that they still have not received octreotide injections and have been speaking with Lupita Leash at [facility] (phone [phone] ext [phone]). I advised that rx was sent for octerotide on 07/03/23 electronically to [facility]. I attempted to reach Lupita Leash to find out what is needed from our office, but had to leave a voicemail. I did call the main number and was told that it seems the octerotide needs a prior authorization. I have spoken to [employer] with our PA team. She says that she has been attempting PA but continues to get a note that member name cannot be found. She is going to be calling to do an over the phone PA instead. I have advised Mrs.Byrns of this and will continue to follow.  Patient asks if his appointment can be moved from 08/02/23 to a later date as he normally celebrates [place] with his [place] the beach that week and is finally feeling well enough to make trips again. I have rescheduled him for appointment with Dr Barron Alvine on 08/08/23 at 1020 am.  Dr C- Are you ok with abdominal xray to view capsule location(s)?

## 2023-07-12 ENCOUNTER — Other Ambulatory Visit (HOSPITAL_COMMUNITY): Payer: Self-pay

## 2023-07-12 NOTE — Telephone Encounter (Signed)
I have spoken to patient's wife to advise that Dr Barron Alvine is ok with ordering xray. Xray order placed to be done at [facility] as requested by patient. Also advised an expedited PA was completed and faxed by our prior authorization team yesterday for octreotide, so we should get a response from insurance soon.

## 2023-07-13 MED ORDER — OCTREOTIDE ACETATE 100 MCG/ML ~~LOC~~ SOSY: 1.0000 mL | PREFILLED_SYRINGE | Freq: Two times a day (BID) | SUBCUTANEOUS | 3 refills | Status: DC

## 2023-07-13 MED ORDER — OCTREOTIDE ACETATE 100 MCG/ML IJ SOLN: 100.0000 ug | Freq: Two times a day (BID) | INTRAMUSCULAR | 3 refills | Status: DC

## 2023-07-13 NOTE — Addendum Note (Signed)
Addended by: Richardson Chiquito on: 07/13/2023 01:02 PM   Modules accepted: Orders

## 2023-07-13 NOTE — Telephone Encounter (Signed)
Harry Patterson called back. She states that [facility] can give octreotide rx for 100 dollars and have it available on Monday. Would like rx sent there. Rx sent as requested.

## 2023-07-13 NOTE — Telephone Encounter (Signed)
PT is returning call to further discuss [facility] issues. Please advise.

## 2023-07-13 NOTE — Telephone Encounter (Signed)
Spoke to Erskine Squibb (wife) and advised that I got the same fax she did from [place] with [facility]: "We have made several attempts to contact your office for assistance in our benefits investigation process. Because we have not received a response, we will be closing the case. If you would like to reopen this case, please feel free to call us at the number below....  [phone] ext [phone]   I advised Erskine Squibb that I did contact [facility] at the above number this morning and was advised that they are needing prior authorization for the medication. I discussed with representative that PA has been attempted and I am told by insurance should have a determination tomorrow. Representative tells me nothing else is needed on our end.   Erskine Squibb verbalizes understanding and I have assured her that I will continue to follow up on this.

## 2023-07-13 NOTE — Telephone Encounter (Signed)
I contacted [employer] Prior Review/Certification lline (phone [phone] and spoke to [employer]. She indicates that octreotide rx is still under clinical review but is scheduled to return with determination on tomorrow.  I have left a voicemail advising Harry Patterson of this information.

## 2023-07-13 NOTE — Addendum Note (Signed)
Addended by: Richardson Chiquito on: 07/13/2023 12:48 PM   Modules accepted: Orders

## 2023-07-16 ENCOUNTER — Other Ambulatory Visit (HOSPITAL_COMMUNITY): Payer: Self-pay

## 2023-07-16 NOTE — Progress Notes (Signed)
Non-infectious systemic inflammatory response syndrome with acute organ dysfunction of acute kidney injury

## 2023-07-16 NOTE — Telephone Encounter (Signed)
Pharmacy Patient Advocate Encounter  Received notification from [facility] that Prior Authorization for OCTREOTIDE 100MG  SOL has been DENIED.  Full denial letter will be uploaded to the media tab. See denial reason below.    PA #/Case ID/Reference #: [id number]

## 2023-07-17 NOTE — Telephone Encounter (Signed)
Insurance requires peer to peer review prior to considering decision reversal. Dr Barron Alvine, can you give a couple dates/times you would be available to complete a peer to peer?

## 2023-07-17 NOTE — Telephone Encounter (Signed)
-----   Message ----- From: Shellia Cleverly, DO Sent: 07/17/2023   1:59 PM EST To: Richardson Chiquito, RN  Can we set up peer to peer tomorrow, 12/18 at 12:15 or 12/20 at 12:15? Thanks.

## 2023-07-17 NOTE — Telephone Encounter (Signed)
Peer to peer has been scheduled to occur 07/18/23 from 12 pm-1230 pm (closest I could get to 1215 pm). Call should be coming to your mobile number.  [employer] phone [phone].  PA #/Case ID/Reference #: [id number]

## 2023-07-18 ENCOUNTER — Inpatient Hospital Stay: Payer: BC Managed Care – PPO

## 2023-07-18 ENCOUNTER — Ambulatory Visit
Admission: RE | Admit: 2023-07-18 | Discharge: 2023-07-18 | Disposition: A | Discharge status: Home / Self Care | Payer: BC Managed Care – PPO | Source: Ambulatory Visit | Attending: Gastroenterology | Admitting: Gastroenterology

## 2023-07-18 ENCOUNTER — Encounter: Payer: Self-pay | Admitting: Oncology

## 2023-07-18 ENCOUNTER — Inpatient Hospital Stay: Payer: BC Managed Care – PPO | Attending: Oncology | Admitting: Oncology

## 2023-07-18 VITALS — BP 119/57 | HR 70 | Temp 98.2°F | Resp 18 | Wt 176.2 lb

## 2023-07-18 DIAGNOSIS — T189XXS Foreign body of alimentary tract, part unspecified, sequela: Secondary | ICD-10-CM | POA: Insufficient documentation

## 2023-07-18 DIAGNOSIS — D509 Iron deficiency anemia, unspecified: Secondary | ICD-10-CM | POA: Insufficient documentation

## 2023-07-18 DIAGNOSIS — R5383 Other fatigue: Secondary | ICD-10-CM | POA: Diagnosis not present

## 2023-07-18 DIAGNOSIS — W448XXS Other foreign body entering into or through a natural orifice, sequela: Secondary | ICD-10-CM | POA: Diagnosis not present

## 2023-07-18 DIAGNOSIS — D631 Anemia in chronic kidney disease: Secondary | ICD-10-CM | POA: Diagnosis not present

## 2023-07-18 DIAGNOSIS — N183 Chronic kidney disease, stage 3 unspecified: Secondary | ICD-10-CM | POA: Insufficient documentation

## 2023-07-18 LAB — IRON AND TIBC
Iron: 160 ug/dL (ref 45–182)
Saturation Ratios: 41 % — ABNORMAL HIGH (ref 17.9–39.5)
TIBC: 392 ug/dL (ref 250–450)
UIBC: 232 ug/dL

## 2023-07-18 LAB — CBC WITH DIFFERENTIAL/PLATELET
Abs Immature Granulocytes: 0.02 10*3/uL (ref 0.00–0.07)
Basophils Absolute: 0.1 10*3/uL (ref 0.0–0.1)
Basophils Relative: 1 %
Eosinophils Absolute: 0.3 10*3/uL (ref 0.0–0.5)
Eosinophils Relative: 5 %
HCT: 35.6 % — ABNORMAL LOW (ref 39.0–52.0)
Hemoglobin: 10.8 g/dL — ABNORMAL LOW (ref 13.0–17.0)
Immature Granulocytes: 0 %
Lymphocytes Relative: 21 %
Lymphs Abs: 1 10*3/uL (ref 0.7–4.0)
MCH: 28.9 pg (ref 26.0–34.0)
MCHC: 30.3 g/dL (ref 30.0–36.0)
MCV: 95.2 fL (ref 80.0–100.0)
Monocytes Absolute: 0.6 10*3/uL (ref 0.1–1.0)
Monocytes Relative: 13 %
Neutro Abs: 3.1 10*3/uL (ref 1.7–7.7)
Neutrophils Relative %: 60 %
Platelets: 324 10*3/uL (ref 150–400)
RBC: 3.74 MIL/uL — ABNORMAL LOW (ref 4.22–5.81)
RDW: 16.6 % — ABNORMAL HIGH (ref 11.5–15.5)
WBC: 5.1 10*3/uL (ref 4.0–10.5)
nRBC: 0 % (ref 0.0–0.2)

## 2023-07-18 LAB — COMPREHENSIVE METABOLIC PANEL
ALT: 13 U/L (ref 0–44)
AST: 18 U/L (ref 15–41)
Albumin: 3.8 g/dL (ref 3.5–5.0)
Alkaline Phosphatase: 99 U/L (ref 38–126)
Anion gap: 9 (ref 5–15)
BUN: 14 mg/dL (ref 8–23)
CO2: 26 mmol/L (ref 22–32)
Calcium: 9.2 mg/dL (ref 8.9–10.3)
Chloride: 103 mmol/L (ref 98–111)
Creatinine, Ser: 1.13 mg/dL (ref 0.61–1.24)
GFR, Estimated: >60 mL/min (ref >60)
Glucose, Bld: 125 mg/dL — ABNORMAL HIGH (ref 70–99)
Potassium: 4.5 mmol/L (ref 3.5–5.1)
Sodium: 138 mmol/L (ref 135–145)
Total Bilirubin: 0.7 mg/dL (ref <1.2)
Total Protein: 7.3 g/dL (ref 6.5–8.1)

## 2023-07-18 LAB — RETICULOCYTES
Immature Retic Fract: 16.1 % — ABNORMAL HIGH (ref 2.3–15.9)
RBC.: 3.55 MIL/uL — ABNORMAL LOW (ref 4.22–5.81)
Retic Count, Absolute: 128 10*3/uL (ref 19.0–186.0)
Retic Ct Pct: 3.6 % — ABNORMAL HIGH (ref 0.4–3.1)

## 2023-07-18 LAB — VITAMIN B12: Vitamin B-12: 815 pg/mL (ref 180–914)

## 2023-07-18 LAB — LACTATE DEHYDROGENASE: LDH: 164 U/L (ref 98–192)

## 2023-07-18 LAB — TSH: TSH: 0.965 u[IU]/mL (ref 0.350–4.500)

## 2023-07-18 LAB — FERRITIN: Ferritin: 37 ng/mL (ref 24–336)

## 2023-07-18 LAB — FOLATE: Folate: >40 ng/mL (ref >5.9)

## 2023-07-18 MED ORDER — OCTREOTIDE ACETATE 100 MCG/ML ~~LOC~~ SOSY: 1.0000 mL | PREFILLED_SYRINGE | Freq: Two times a day (BID) | SUBCUTANEOUS | 1 refills | Status: DC

## 2023-07-18 NOTE — Progress Notes (Unsigned)
Hematology/Oncology Consult note [facility] [phone] [phone]  Patient Care Team: Lynnea Ferrier, MD as PCP - General (Internal Medicine)   Name of the patient: Harry Patterson  [id number]  Mar 16, 1946    Reason for referral-anemia of chronic kidney disease   Referring physician-Dr. Wynelle Link  Date of visit: 07/18/23   History of presenting illness- Patient is a 77 year old male with a past medical history significant for hypertension, CAD, BPH, stage IIIb kidney disease referred for anemia CBC from 07/09/2023 showed white count of 3.4, H&H of 9.1/29.3 and a platelet count of 306.  Serum creatinine was normal at 0.9.  Hepatitis B and C testing negative.  His hemoglobin has been around 7 since November 2024 when he was admitted to the hospital for symptoms of GI bleed.  He had EGD which showed Barrett's esophagus but no source of bleeding.  Colonoscopy showed hemorrhoids and diverticulosis.  Capsule endoscopy negative.  Since Meckel scan was negative.  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  Single lumen enteroscopy was done which showed 2 small AVMs which were cauterized as well.  He received PRBC transfusion about 14 units during his hospital stay.  ECOG PS- ***  Pain scale- ***   Review of systems- ROS  Allergies  Allergen Reactions   Amoxicillin Other (See Comments)    Oral thrush   Other Swelling    Pneumonia vaccine-swollen/painful/fever chills   Amlodipine Other (See Comments)    EDEMA    Patient Active Problem List   Diagnosis Date Noted   AVM (arteriovenous malformation) of small bowel, acquired with hemorrhage 06/22/2023   ABLA (acute blood loss anemia) 06/22/2023   GI bleed 06/21/2023   Upper GI bleed 06/16/2023   Alcohol use 06/16/2023   Symptomatic anemia 06/16/2023   Suprapubic tenderness 06/16/2023    Allergic rhinitis 04/26/2018   B12 deficiency 04/26/2018   Leukopenia 04/26/2018   Barrett's esophagus without dysplasia 03/23/2017   Polyp of colon, adenomatous 03/23/2017   Aortic atherosclerosis (HCC) 03/01/2017   Avascular necrosis of bones of both hips (HCC) 03/01/2017   History of nonmelanoma skin cancer 06/14/2015   Polyneuropathy 10/29/2014   Essential hypertension 06/16/2014   Pure hypercholesterolemia 06/16/2014   LBBB (left bundle branch block) 03/19/2014   Benign prostatic hyperplasia without lower urinary tract symptoms 01/08/2014   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   Erectile dysfunction 01/08/2014   Other specified disorders of urethra 01/08/2014   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   Psychosexual dysfunction with inhibited sexual excitement 01/08/2014     Past Medical History:  Diagnosis Date   Allergic rhinitis    Arthritis    Avascular necrosis of bones of both hips (HCC)    B12 deficiency    Barrett's esophagus without dysplasia    Bilateral inguinal hernia    Bilateral renal cysts    CAD (coronary artery disease)    Chronic prostatitis    DDD (degenerative disc disease), lumbar    Enlarged prostate    Erectile dysfunction    a.) on PDE5i (sildenafil)   GERD (gastroesophageal reflux disease)    History of bilateral cataract extraction 2020   HLD (hyperlipidemia)    HTN (hypertension)    Hypertension    LBBB (left bundle branch block)    Leukopenia    Long term current use of aspirin    OSA on CPAP    Peripheral neuropathy  Prediabetes    Skin cancer    Substance abuse (HCC)    Vitamin D deficiency      Past Surgical History:  Procedure Laterality Date   APPENDECTOMY     BALLOON ENTEROSCOPY N/A 06/22/2023   Procedure: BALLOON ENTEROSCOPY;  Surgeon: Shellia Cleverly, DO;  Location: [facility] ENDOSCOPY;  Service: Gastroenterology;  Laterality: N/A;   BIOPSY  01/16/2023   Procedure: BIOPSY;  Surgeon: Regis Bill, MD;  Location: [facility]  ENDOSCOPY;  Service: Endoscopy;;   CATARACT EXTRACTION W/PHACO Right 06/11/2019   Procedure: CATARACT EXTRACTION PHACO AND INTRAOCULAR LENS PLACEMENT (IOC) RIGHT 00:54.6     22.0%    12.00;  Surgeon: Lockie Mola, MD;  Location: [facility];  Service: Ophthalmology;  Laterality: Right;  sleep apnea   CATARACT EXTRACTION W/PHACO Left 06/30/2019   Procedure: CATARACT EXTRACTION PHACO AND INTRAOCULAR LENS PLACEMENT (IOC) LEFT TORIC LENS 9.97  01:09.1  14.4%;  Surgeon: Lockie Mola, MD;  Location: [facility];  Service: Ophthalmology;  Laterality: Left;   COLONOSCOPY N/A 06/18/2023   Procedure: COLONOSCOPY;  Surgeon: Toledo, Boykin Nearing, MD;  Location: [facility] ENDOSCOPY;  Service: Gastroenterology;  Laterality: N/A;   COLONOSCOPY W/ POLYPECTOMY     02/08/00,11'12'04,05/05/05, 08/21/2008, 12/05/2013, 05/08/2014   COLONOSCOPY WITH PROPOFOL N/A 06/06/2017   Procedure: COLONOSCOPY WITH PROPOFOL;  Surgeon: Scot Jun, MD;  Location: [facility] ENDOSCOPY;  Service: Endoscopy;  Laterality: N/A;   COLONOSCOPY WITH PROPOFOL N/A 01/16/2023   Procedure: COLONOSCOPY WITH PROPOFOL;  Surgeon: Regis Bill, MD;  Location: [facility] ENDOSCOPY;  Service: Endoscopy;  Laterality: N/A;   cyst removed  Right    thumb   CYSTOSCOPY WITH URETHRAL DILATATION N/A 06/05/2023   Procedure: CYSTOSCOPY WITH URETHRAL BALLOON DILATATION USING OPTILUME;  Surgeon: Riki Altes, MD;  Location: [facility] ORS;  Service: Urology;  Laterality: N/A;   ESOPHAGOGASTRODUODENOSCOPY     x7   ESOPHAGOGASTRODUODENOSCOPY N/A 06/17/2023   Procedure: ESOPHAGOGASTRODUODENOSCOPY (EGD);  Surgeon: Toledo, Boykin Nearing, MD;  Location: [facility] ENDOSCOPY;  Service: Gastroenterology;  Laterality: N/A;   ESOPHAGOGASTRODUODENOSCOPY (EGD) WITH PROPOFOL N/A 06/06/2017   Procedure: ESOPHAGOGASTRODUODENOSCOPY (EGD) WITH PROPOFOL;  Surgeon: Scot Jun, MD;  Location: [facility] ENDOSCOPY;  Service: Endoscopy;  Laterality: N/A;    ESOPHAGOGASTRODUODENOSCOPY (EGD) WITH PROPOFOL N/A 11/27/2019   Procedure: ESOPHAGOGASTRODUODENOSCOPY (EGD) WITH PROPOFOL;  Surgeon: Toledo, Boykin Nearing, MD;  Location: [facility] ENDOSCOPY;  Service: Gastroenterology;  Laterality: N/A;   ESOPHAGOGASTRODUODENOSCOPY (EGD) WITH PROPOFOL N/A 01/16/2023   Procedure: ESOPHAGOGASTRODUODENOSCOPY (EGD) WITH PROPOFOL;  Surgeon: Regis Bill, MD;  Location: [facility] ENDOSCOPY;  Service: Endoscopy;  Laterality: N/A;   ESOPHAGOGASTRODUODENOSCOPY (EGD) WITH PROPOFOL N/A 06/27/2023   Procedure: ESOPHAGOGASTRODUODENOSCOPY (EGD) WITH PROPOFOL;  Surgeon: Imogene Burn, MD;  Location: [facility];  Service: Gastroenterology;  Laterality: N/A;   GIVENS CAPSULE STUDY N/A 06/24/2023   Procedure: GIVENS CAPSULE STUDY;  Surgeon: Shellia Cleverly, DO;  Location: [facility] ENDOSCOPY;  Service: Gastroenterology;  Laterality: N/A;   GIVENS CAPSULE STUDY N/A 06/27/2023   Procedure: GIVENS CAPSULE STUDY;  Surgeon: Imogene Burn, MD;  Location: [facility] ENDOSCOPY;  Service: Gastroenterology;  Laterality: N/A;   HEMORRHOID SURGERY     HOT HEMOSTASIS N/A 06/22/2023   Procedure: HOT HEMOSTASIS (ARGON PLASMA COAGULATION/BICAP);  Surgeon: Shellia Cleverly, DO;  Location: [facility] ENDOSCOPY;  Service: Gastroenterology;  Laterality: N/A;   IR ANGIOGRAM VISCERAL SELECTIVE  06/21/2023   SUBMUCOSAL TATTOO INJECTION  06/22/2023   Procedure: SUBMUCOSAL TATTOO INJECTION;  Surgeon: Shellia Cleverly, DO;  Location: [facility] ENDOSCOPY;  Service: Gastroenterology;;   TONSILLECTOMY      Social History   Socioeconomic History   Marital status: Married    Spouse name: DEVONNE, SNEIDER (Spouse)   Number of children: Not on file   Years of education: Not on file   Highest education level: Not on file  Occupational History   Not on file  Tobacco Use   Smoking status: Never   Smokeless tobacco: Never  Vaping Use   Vaping status: Never Used  Substance and Sexual Activity   Alcohol use: Yes    Alcohol/week:  2.0 standard drinks of alcohol    Types: 2 Glasses of wine per week   Drug use: No   Sexual activity: Not Currently    Partners: Female  Other Topics Concern   Not on file  Social History Narrative   Not on file   Social Drivers of Health   Financial Resource Strain: Low Risk  (05/09/2023)   Received from [facility]   Overall Financial Resource Strain (CARDIA)    Difficulty of Paying Living Expenses: Not hard at all  Food Insecurity: No Food Insecurity (06/21/2023)   Hunger Vital Sign    Worried About Running Out of Food in the Last Year: Never true    Ran Out of Food in the Last Year: Never true  Transportation Needs: No Transportation Needs (06/21/2023)   PRAPARE - Administrator, Civil Service (Medical): No    Lack of Transportation (Non-Medical): No  Physical Activity: Not on file  Stress: Not on file  Social Connections: Not on file  Intimate Partner Violence: Not At Risk (06/21/2023)   Humiliation, Afraid, Rape, and Kick questionnaire    Fear of Current or Ex-Partner: No    Emotionally Abused: No    Physically Abused: No    Sexually Abused: No     Family History  Problem Relation Age of Onset   Heart disease Mother    Heart Problems Father      Current Outpatient Medications:    atorvastatin (LIPITOR) 20 MG tablet, Take 20 mg by mouth at bedtime., Disp: , Rfl: 11   Cholecalciferol 1000 units capsule, Take 2,000 Units by mouth at bedtime., Disp: , Rfl:    ferrous sulfate 325 (65 FE) MG tablet, Take 1 tablet (325 mg total) by mouth daily with breakfast., Disp: 90 tablet, Rfl: 0   folic acid (FOLVITE) 1 MG tablet, Take 2 mg by mouth in the morning., Disp: , Rfl:    gabapentin (NEURONTIN) 300 MG capsule, Take 300 mg by mouth in the morning and at bedtime., Disp: , Rfl:    metoprolol tartrate (LOPRESSOR) 25 MG tablet, Take 0.5 tablets (12.5 mg total) by mouth 2 (two) times daily., Disp: 90 tablet, Rfl: 0   Octreotide Acetate 100 MCG/ML  SOSY, Inject 1 mL (100 mcg total) into the skin every 12 (twelve) hours. Prefilled Pens- Will call pharmacy with goodrx number, Disp: 90 mL, Rfl: 1   omega-3 acid ethyl esters (LOVAZA) 1 g capsule, Take 1 g by mouth in the morning and at bedtime., Disp: , Rfl: 3   pantoprazole (PROTONIX) 40 MG injection, Inject 40 mg into the vein every 12 (twelve) hours., Disp: 1 each, Rfl: 0   polyethylene glycol (MIRALAX / GLYCOLAX) 17 g packet, Take 17 g by mouth daily., Disp: 14 each, Rfl: 0   spironolactone (ALDACTONE) 100 MG tablet, Take by mouth., Disp: , Rfl:    tamsulosin (FLOMAX) 0.4 MG CAPS capsule,  Take 1 capsule (0.4 mg total) by mouth daily., Disp: 30 capsule, Rfl: 6   triamcinolone ointment (KENALOG) 0.1 %, Apply 1 Application topically 2 (two) times daily as needed (leg irritation/rash.)., Disp: , Rfl:    vitamin B-12 (CYANOCOBALAMIN) 1000 MCG tablet, Take 1,000 mcg by mouth at bedtime., Disp: , Rfl:    vitamin C (ASCORBIC ACID) 500 MG tablet, Take 500 mg by mouth at bedtime., Disp: , Rfl:    Physical exam:  Vitals:   07/18/23 1417  BP: (!) 119/57  Pulse: 70  Resp: 18  Temp: 98.2 F (36.8 C)  TempSrc: Tympanic  SpO2: 97%  Weight: 176 lb 3.2 oz (79.9 kg)   Physical Exam        Latest Ref Rng & Units 06/29/2023    5:34 AM  CMP  Glucose 70 - 99 mg/dL 604   BUN 8 - 23 mg/dL 6   Creatinine 5.40 - 9.81 mg/dL 1.91   Sodium 478 - 295 mmol/L 136   Potassium 3.5 - 5.1 mmol/L 3.9   Chloride 98 - 111 mmol/L 105   CO2 22 - 32 mmol/L 25   Calcium 8.9 - 10.3 mg/dL 7.4   Total Protein 6.5 - 8.1 g/dL 4.2   Total Bilirubin <6.2 mg/dL 1.3   Alkaline Phos 38 - 126 U/L 46   AST 15 - 41 U/L 16   ALT 0 - 44 U/L 11       Latest Ref Rng & Units 07/02/2023    3:03 PM  CBC  WBC 4.0 - 10.5 K/uL 3.6   Hemoglobin 13.0 - 17.0 g/dL 9.2   Hematocrit 13.0 - 52.0 % 28.1   Platelets 150.0 - 400.0 K/uL 224.0     No images are attached to the encounter.  DG Abd 1 View Result Date:  06/26/2023 CLINICAL DATA:  Capsule endoscopy EXAM: ABDOMEN - 1 VIEW COMPARISON:  06/20/2023 FINDINGS: Nonobstructive bowel gas pattern. Two endoscopy capsules are present within the lower abdomen, one of which projects over the right iliac wing, which could be within distal small bowel or at the base of the cecum. Additional capsule projects within the left lower quadrant, expected location of the proximal sigmoid colon. IMPRESSION: Two endoscopy capsules are present within the lower abdomen, one of which projects over the right iliac wing, which could be within distal small bowel or at the base of the cecum. Additional capsule projects within the left lower quadrant, expected location of the proximal sigmoid colon. Electronically Signed   By: Duanne Guess D.O.   On: 06/26/2023 14:19   NM Bowel Img Meckels Result Date: 06/23/2023 CLINICAL DATA:  Acute gastrointestinal hemorrhage the EXAM: NUCLEAR MEDICINE BOWEL (MECKEL'S) SCAN TECHNIQUE: Sequential abdominal images were obtained following intravenous injection of radiopharmaceutical. RADIOPHARMACEUTICALS:  11.0 mCi Tc-32m pertechnetate IV COMPARISON:  None available. Findings are correlated with prior CT arteriogram of 06/20/2023 FINDINGS: Expected accumulation of radiotracer within the gastric lumen and progressively within the bladder lumen over the course of the examination. No focal uptake of radiotracer to suggest ectopic gastric mucosa within the abdomen. IMPRESSION: No ectopic gastric mucosa identified; negative examination Electronically Signed   By: Helyn Numbers M.D.   On: 06/23/2023 19:12   IR Angiogram Visceral Selective Result Date: 06/21/2023 INDICATION: small bowel bleed UGIB. Active extravasation from mid small bowel on CTA EXAM: Procedure: MESENTERIC ARTERIOGRAPHY Including SUPERIOR MESENTERIC ARTERY, JEJUNAL ARTERIAL BRANCHES INTERROGATION COMPARISON:  CT AP, 06/20/2023 and 06/19/2023. MEDICATIONS: None ANESTHESIA/SEDATION: Moderate  (conscious) sedation was employed  during this procedure. A total of Versed 0.5 mg and Fentanyl 25 mcg was administered intravenously. Moderate Sedation Time: 47 minutes. The patient's level of consciousness and vital signs were monitored continuously by radiology nursing throughout the procedure under my direct supervision. CONTRAST:  105 mL Omnipaque 300 FLUOROSCOPY TIME:  Fluoroscopic dose; 311 mGy COMPLICATIONS: None immediate. PROCEDURE: Informed consent was obtained from the patient and/or patient's representative following explanation of the procedure, risks, benefits and alternatives. All questions were addressed. A time out was performed prior to the initiation of the procedure. Maximal barrier sterile technique utilized including caps, mask, sterile gowns, sterile gloves, large sterile drape, hand hygiene, and sterile prep. The RIGHT femoral head was marked fluoroscopically. Under sterile conditions and local anesthesia, the RIGHT common femoral artery access was performed with a micropuncture needle. Under direct ultrasound guidance, the RIGHT common femoral was accessed with a micropuncture kit. An ultrasound image was saved for documentation purposes. A limited arteriogram was performed through the side arm of the sheath confirming appropriate access within the RIGHT femoral artery. This allowed for placement of a 5 Fr vascular sheath. Over a Bentson wire, a 5 Fr cobra 2 catheter was used to select the superior mesenteric artery. Selective mesenteric angiograms were performed. Despite extensive mesenteric territory vessel interrogation, no active extravasation was identified. Embolization was NOT performed. Images were reviewed and the procedure was terminated. All wires, catheters and sheaths were removed from the patient. Hemostasis was achieved at the RIGHT groin access site with Angio-Seal closure. The patient remained hemodynamically stable during the procedure, and was without immediate post  procedural complication. FINDINGS: *Variant splanchnic arterial anatomy with a replaced common hepatic artery originating from the SMA. *Interrogation of the SMA and distal (jejunal) arterial branches, within the area of noted extravasation on CTA. *No active extravasation was observed. Embolization was NOT performed. IMPRESSION: Mesenteric arteriography in interrogation for upper GI bleed. No active extravasation was observed on catheter angiography. Embolization was NOT performed. PLAN: - The patient is to remain flat for 2 hours with RIGHT leg straight. - if there remains clinical concern for persistent upper GI bleeding, would recommend coordinating with the Vascular and interventional Radiology (VIR) service to perform a CTA abdomen so as to expedite potential repeat angiography if the study is positive. Roanna Banning, MD [facility] Rad[facility] Electronically Signed   By: Roanna Banning M.D.   On: 06/21/2023 07:07   CT Angio Abd/Pel w/ and/or w/o Result Date: 06/20/2023 CLINICAL DATA:  Lower GI bleed. EXAM: CTA ABDOMEN AND PELVIS WITHOUT AND WITH CONTRAST TECHNIQUE: Multidetector CT imaging of the abdomen and pelvis was performed using the standard protocol during bolus administration of intravenous contrast. Multiplanar reconstructed images and MIPs were obtained and reviewed to evaluate the vascular anatomy. RADIATION DOSE REDUCTION: This exam was performed according to the departmental dose-optimization program which includes automated exposure control, adjustment of the mA and/or kV according to patient size and/or use of iterative reconstruction technique. CONTRAST:  OMNIPAQUE IOHEXOL 350 MG/ML SOLN COMPARISON:  CT dated 06/19/2023. FINDINGS: VASCULAR Aorta: Mild atherosclerotic calcification. No aneurysmal dilatation or dissection. No periaortic fluid collection. Celiac: The celiac trunk and its major branches are patent. SMA: Atherosclerotic  calcification of the origin of the SMA. The SMA is patent. The common hepatic artery arises from the SMA. Renals: The renal arteries are patent. IMA: The IMA is patent. Inflow: Mild atherosclerotic calcification. The iliac arteries are patent. No aneurysmal dilatation or dissection. Proximal Outflow: The visualized proximal  outflow is patent. Veins: The IVC is unremarkable. The SMV, splenic vein, and main portal vein are patent. No portal venous gas. Review of the MIP images confirms the above findings. NON-VASCULAR Lower chest: The visualized lung bases are clear. There is coronary vascular calcification. No intra-abdominal free air or free fluid. Hepatobiliary: The liver is unremarkable. No biliary dilatation. High attenuating content within the gallbladder, likely vicariously excreted contrast from recent study. Pancreas: Unremarkable. No pancreatic ductal dilatation or surrounding inflammatory changes. Spleen: Normal in size without focal abnormality. Adrenals/Urinary Tract: The adrenal glands are unremarkable. There is no hydronephrosis or nephrolithiasis on either side. Bilateral renal cysts. The visualized ureters and urinary bladder appear unremarkable. Stomach/Bowel: Endoscopy capsule with associated streak artifact in the mid descending colon. There is mild sigmoid diverticulosis without active inflammatory changes. Evaluation for GI bleed is limited due to presence of high attenuating content in the colon. There is however pooling of contrast in a small bowel loop in the upper abdomen to the right of the midline (images 33-39 on axial series 15) consistent with active small-bowel bleed. There is no bowel obstruction. Appendectomy. Lymphatic: No adenopathy. Reproductive: The prostate and seminal vesicles are grossly unremarkable. Other: Small fat containing left inguinal hernia. No bowel herniation or fluid collection. Musculoskeletal: Degenerative changes of the spine. Bilateral femoral head avascular  necrosis, right greater than left. No acute osseous pathology. IMPRESSION: 1. Active small-bowel bleed in the upper abdomen. 2. Endoscopy capsule in the mid descending colon. 3. Mild sigmoid diverticulosis. No bowel obstruction. Electronically Signed   By: Elgie Collard M.D.   On: 06/20/2023 21:36   CT Angio Abd/Pel w/ and/or w/o Result Date: 06/19/2023 CLINICAL DATA:  Lower GI bleed EXAM: CTA ABDOMEN AND PELVIS WITHOUT AND WITH CONTRAST TECHNIQUE: Multidetector CT imaging of the abdomen and pelvis was performed using the standard protocol during bolus administration of intravenous contrast. Multiplanar reconstructed images and MIPs were obtained and reviewed to evaluate the vascular anatomy. RADIATION DOSE REDUCTION: This exam was performed according to the departmental dose-optimization program which includes automated exposure control, adjustment of the mA and/or kV according to patient size and/or use of iterative reconstruction technique. CONTRAST:  OMNIPAQUE IOHEXOL 350 MG/ML SOLN COMPARISON:  None Available. FINDINGS: VASCULAR Aorta: Normal caliber aorta without aneurysm, dissection, vasculitis or significant stenosis. Mild scattered atherosclerotic plaque. Celiac: Patent without evidence of aneurysm, dissection, vasculitis or significant stenosis. The common hepatic artery is replaced to the SMA. SMA: Patent without evidence of aneurysm, dissection, vasculitis or significant stenosis. Replaced common hepatic artery. Renals: Both renal arteries are patent without evidence of aneurysm, dissection, vasculitis, fibromuscular dysplasia or significant stenosis. IMA: Patent without evidence of aneurysm, dissection, vasculitis or significant stenosis. Inflow: Patent without evidence of aneurysm, dissection, vasculitis or significant stenosis. Proximal Outflow: Bilateral common femoral and visualized portions of the superficial and profunda femoral arteries are patent without evidence of aneurysm,  dissection, vasculitis or significant stenosis. Veins: No focal venous abnormality. Review of the MIP images confirms the above findings. NON-VASCULAR Lower chest: The intracardiac blood pool is hypodense relative to the adjacent myocardium consistent with anemia. Hepatobiliary: No focal liver abnormality is seen. No gallstones, gallbladder wall thickening, or biliary dilatation. Pancreas: Unremarkable. No pancreatic ductal dilatation or surrounding inflammatory changes. Spleen: Normal in size without focal abnormality. Adrenals/Urinary Tract: Normal adrenal glands. No hydronephrosis, nephrolithiasis or enhancing renal mass. Multiple water attenuation renal lesions bilaterally consistent with simple cysts. The ureters and bladder are unremarkable. Stomach/Bowel: No evidence of extravasation of contrast material into  the stomach, large bowel or small bowel. An endoscopic capsule is present within the cecum. No focal bowel wall thickening or evidence of obstruction. Lymphatic: No suspicious lymphadenopathy. Reproductive: Prostate is unremarkable. Other: Fat containing left inguinal hernia. Musculoskeletal: No acute fracture or aggressive appearing lytic or blastic osseous lesion. IMPRESSION: 1. Negative for active GI bleed at this time. 2. The intracardiac blood pool is hypodense relative to the adjacent myocardium consistent with anemia. 3. Endoscopic capsule present within the cecum. 4. No focal bowel wall thickening, significant diverticulosis or evidence of obstruction. 5. Omental fat containing left inguinal hernia. 6. Common hepatic artery is replaced to the SMA. Aortic Atherosclerosis (ICD10-I70.0). Signed, Sterling Big, MD, RPVI [facility] Radiology Electronically Signed   By: Malachy Moan M.D.   On: 06/19/2023 16:02    Assessment and plan- Patient is a 77 y.o. male ***   Thank you for this kind referral and the opportunity to participate in  the care of this  Patient   Visit Diagnosis No diagnosis found.  Dr. Owens Shark, MD, MPH [facility] at [facility] [phone] 07/18/2023

## 2023-07-18 NOTE — Telephone Encounter (Signed)
Per Good Rx, patient can get 90 178mcg/1ml prefilled pens for $197.64 at [facility]. I have contacted [facility] on [address] in [place] with script for ocreotide 100 mcg/55ml prefilled pens- inject 1 ml into skin every 12 hours. #90 ml, 1 refill. I have also given GoodRx coupon: BIN [id number] PCN [employer] [id number] Member ID [id number].  [place], pharmacist at [facility] indicates that she will get rx put in the system and order for patient.

## 2023-07-18 NOTE — Telephone Encounter (Signed)
Contacted [facility] [address]. They indicate that they do not stock octreotide but have ordered 90 pens. Rx is going through for $125.83.

## 2023-07-18 NOTE — Addendum Note (Signed)
Addended by: Richardson Chiquito on: 07/18/2023 01:36 PM   Modules accepted: Orders

## 2023-07-19 ENCOUNTER — Other Ambulatory Visit: Payer: Self-pay | Admitting: Oncology

## 2023-07-19 DIAGNOSIS — D509 Iron deficiency anemia, unspecified: Secondary | ICD-10-CM | POA: Insufficient documentation

## 2023-07-19 DIAGNOSIS — D508 Other iron deficiency anemias: Secondary | ICD-10-CM

## 2023-07-19 LAB — HAPTOGLOBIN: Haptoglobin: 271 mg/dL (ref 34–355)

## 2023-07-20 NOTE — Telephone Encounter (Signed)
Patients wife called stated the medication is not available and she would also like to know the radiology results.

## 2023-07-20 NOTE — Telephone Encounter (Signed)
Patients wife called stated the medication below was not available, also would like to know about the radiology results.

## 2023-07-20 NOTE — Telephone Encounter (Signed)
I have again contacted [place] at [facility] on [address] who 2 days ago told me that rx for octreotide would be sent for order and should cost $125.83 for 90 pens. She now tells me that they "cannot even order this medication."  At this time, our only other option is to appeal insurance decision on denial which will likely take several weeks to get a decision on. We will try to work on this; will make Dr Barron Alvine aware.  In regards to xray, advised that radiology reading still has not returned on his 07/18/23 study. Will request that radiology read this.

## 2023-07-20 NOTE — Telephone Encounter (Signed)
Patient wife requesting a f/u call in regards to previous note. Please advise.

## 2023-07-20 NOTE — Telephone Encounter (Signed)
Spoke to [place], patient's wife to confirm that octreotide was sent to [facility] In [place], [place].

## 2023-07-22 LAB — MULTIPLE MYELOMA PANEL, SERUM
Albumin SerPl Elph-Mcnc: 3.6 g/dL (ref 2.9–4.4)
Albumin/Glob SerPl: 1.2 (ref 0.7–1.7)
Alpha 1: 0.3 g/dL (ref 0.0–0.4)
Alpha2 Glob SerPl Elph-Mcnc: 0.9 g/dL (ref 0.4–1.0)
B-Globulin SerPl Elph-Mcnc: 1.1 g/dL (ref 0.7–1.3)
Gamma Glob SerPl Elph-Mcnc: 0.8 g/dL (ref 0.4–1.8)
Globulin, Total: 3.1 g/dL (ref 2.2–3.9)
IgA: 146 mg/dL (ref 61–437)
IgG (Immunoglobin G), Serum: 715 mg/dL (ref 603–1613)
IgM (Immunoglobulin M), Srm: 129 mg/dL (ref 15–143)
Total Protein ELP: 6.7 g/dL (ref 6.0–8.5)

## 2023-07-26 ENCOUNTER — Encounter: Payer: Self-pay | Admitting: Oncology

## 2023-07-26 ENCOUNTER — Inpatient Hospital Stay (HOSPITAL_BASED_OUTPATIENT_CLINIC_OR_DEPARTMENT_OTHER): Payer: BC Managed Care – PPO | Admitting: Oncology

## 2023-07-26 DIAGNOSIS — D509 Iron deficiency anemia, unspecified: Secondary | ICD-10-CM

## 2023-07-27 NOTE — Progress Notes (Unsigned)
I connected with Harry Patterson on 07/27/23 at 11:45 AM EST by telephone visit and verified that I am speaking with the correct person using two identifiers.   I discussed the limitations, risks, security and privacy concerns of performing an evaluation and management service by telemedicine and the availability of in-person appointments. I also discussed with the patient that there may be a patient responsible charge related to this service. The patient expressed understanding and agreed to proceed.  Other persons participating in the visit and their role in the encounter:  none  Patient's location:  home Provider's location:  home  Chief Complaint: Discuss results of blood work  History of present illness: Patient is a 77 year old male with a past medical history significant for hypertension, CAD, BPH, stage IIIb kidney disease referred for anemia CBC from 07/09/2023 showed white count of 3.4, H&H of 9.1/29.3 and a platelet count of 306.  Serum creatinine was normal at 0.9.  Hepatitis B and C testing negative.  His hemoglobin has been around 7 since November 2024 when he was admitted to the hospital for symptoms of GI bleed.  He had EGD which showed Barrett's esophagus but no source of bleeding.  Colonoscopy showed hemorrhoids and diverticulosis.  Capsule endoscopy negative.  Since Meckel scan was negative.  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  Single lumen enteroscopy was done which showed 2 small AVMs which were cauterized as well.  He received PRBC transfusion about 14 units during his hospital stay.   Interval history ***   ROS  Allergies  Allergen Reactions  . Amoxicillin Other (See Comments)    Oral thrush  . Other Swelling    Pneumonia vaccine-swollen/painful/fever chills  . Amlodipine Other (See Comments)    EDEMA    Past Medical History:  Diagnosis Date  .  Allergic rhinitis   . Anemia   . Arthritis   . Avascular necrosis of bones of both hips (HCC)   . B12 deficiency   . Barrett's esophagus without dysplasia   . Bilateral inguinal hernia   . Bilateral renal cysts   . CAD (coronary artery disease)   . Chronic prostatitis   . DDD (degenerative disc disease), lumbar   . Enlarged prostate   . Erectile dysfunction    a.) on PDE5i (sildenafil)  . GERD (gastroesophageal reflux disease)   . History of bilateral cataract extraction 2020  . HLD (hyperlipidemia)   . HTN (hypertension)   . Hypertension   . LBBB (left bundle branch block)   . Leukopenia   . Long term current use of aspirin   . OSA on CPAP   . Peripheral neuropathy   . Prediabetes   . Skin cancer   . Substance abuse (HCC)   . Vitamin D deficiency     Past Surgical History:  Procedure Laterality Date  . APPENDECTOMY    . BALLOON ENTEROSCOPY N/A 06/22/2023   Procedure: BALLOON ENTEROSCOPY;  Surgeon: Shellia Cleverly, DO;  Location: [facility];  Service: Gastroenterology;  Laterality: N/A;  . BIOPSY  01/16/2023   Procedure: BIOPSY;  Surgeon: Regis Bill, MD;  Location: [facility] END[facility];  Service: Endoscopy;;  . CATARACT EXTRACTION W/PHACO Right 06/11/2019   Procedure: CATARACT EXTRACTION PHACO AND INTRAOCULAR LENS PLACEMENT (IOC) RIGHT 00:54.6     22.0%    12.00;  Surgeon: Lockie Mola, MD;  Location: [facility];  Service: Ophthalmology;  Laterality:  Right;  sleep apnea  . CATARACT EXTRACTION W/PHACO Left 06/30/2019   Procedure: CATARACT EXTRACTION PHACO AND INTRAOCULAR LENS PLACEMENT (IOC) LEFT TORIC LENS 9.97  01:09.1  14.4%;  Surgeon: Lockie Mola, MD;  Location: [facility];  Service: Ophthalmology;  Laterality: Left;  . COLONOSCOPY N/A 06/18/2023   Procedure: COLONOSCOPY;  Surgeon: Toledo, Boykin Nearing, MD;  Location: [facility] END[facility];  Service: Gastroenterology;  Laterality: N/A;  . COLONOSCOPY W/ POLYPECTOMY      02/08/00,11'12'04,05/05/05, 08/21/2008, 12/05/2013, 05/08/2014  . COLONOSCOPY WITH PROPOFOL N/A 06/06/2017   Procedure: COLONOSCOPY WITH PROPOFOL;  Surgeon: Scot Jun, MD;  Location: [facility] END[facility]COPY;  Service: Endoscopy;  Laterality: N/A;  . COLONOSCOPY WITH PROPOFOL N/A 01/16/2023   Procedure: COLONOSCOPY WITH PROPOFOL;  Surgeon: Regis Bill, MD;  Location: [facility] END[facility]COPY;  Service: Endoscopy;  Laterality: N/A;  . cyst removed  Right    thumb  . CYSTOSCOPY WITH URETHRAL DILATATION N/A 06/05/2023   Procedure: CYSTOSCOPY WITH URETHRAL BALLOON DILATATION USING OPTILUME;  Surgeon: Riki Altes, MD;  Location: [facility] OR[facility];  Service: Urology;  Laterality: N/A;  . ESOPHAGOGASTRODUODENOSCOPY     x7  . ESOPHAGOGASTRODUODENOSCOPY N/A 06/17/2023   Procedure: ESOPHAGOGASTRODUODENOSCOPY (EGD);  Surgeon: Toledo, Boykin Nearing, MD;  Location: [facility] END[facility];  Service: Gastroenterology;  Laterality: N/A;  . ESOPHAGOGASTRODUODENOSCOPY (EGD) WITH PROPOFOL N/A 06/06/2017   Procedure: ESOPHAGOGASTRODUODENOSCOPY (EGD) WITH PROPOFOL;  Surgeon: Scot Jun, MD;  Location: [facility] ENDOSCOPY;  Service: Endoscopy;  Laterality: N/A;  . ESOPHAGOGASTRODUODENOSCOPY (EGD) WITH PROPOFOL N/A 11/27/2019   Procedure: ESOPHAGOGASTRODUODENOSCOPY (EGD) WITH PROPOFOL;  Surgeon: Toledo, Boykin Nearing, MD;  Location: [facility];  Service: Gastroenterology;  Laterality: N/A;  . ESOPHAGOGASTRODUODENOSCOPY (EGD) WITH PROPOFOL N/A 01/16/2023   Procedure: ESOPHAGOGASTRODUODENOSCOPY (EGD) WITH PROPOFOL;  Surgeon: Regis Bill, MD;  Location: [facility] END[facility];  Service: Endoscopy;  Laterality: N/A;  . ESOPHAGOGASTRODUODENOSCOPY (EGD) WITH PROPOFOL N/A 06/27/2023   Procedure: ESOPHAGOGASTRODUODENOSCOPY (EGD) WITH PROPOFOL;  Surgeon: Imogene Burn, MD;  Location: [facility] ENDOSCOPY;  Service: Gastroenterology;  Laterality: N/A;  . GIVENS CAPSULE STUDY N/A 06/24/2023   Procedure: GIVENS CAPSULE STUDY;  Surgeon: Shellia Cleverly,  DO;  Location: [facility];  Service: Gastroenterology;  Laterality: N/A;  . GIVENS CAPSULE STUDY N/A 06/27/2023   Procedure: GIVENS CAPSULE STUDY;  Surgeon: Imogene Burn, MD;  Location: [facility] END[facility]COPY;  Service: Gastroenterology;  Laterality: N/A;  . HEMORRHOID SURGERY    . HOT HEMOSTASIS N/A 06/22/2023   Procedure: HOT HEMOSTASIS (ARGON PLASMA COAGULATION/BICAP);  Surgeon: Shellia Cleverly, DO;  Location: [facility] ENDOSCOPY;  Service: Gastroenterology;  Laterality: N/A;  . IR ANGIOGRAM VISCERAL SELECTIVE  06/21/2023  . SUBMUCOSAL TATTOO INJECTION  06/22/2023   Procedure: SUBMUCOSAL TATTOO INJECTION;  Surgeon: Shellia Cleverly, DO;  Location: [facility];  Service: Gastroenterology;;  . TONSILLECTOMY      Social History   Socioeconomic History  . Marital status: Married    Spouse name: CASHIS, DIERKER (Spouse)  . Number of children: Not on file  . Years of education: Not on file  . Highest education level: Not on file  Occupational History  . Not on file  Tobacco Use  . Smoking status: Never    Passive exposure: Never  . Smokeless tobacco: Never  Vaping Use  . Vaping status: Never Used  Substance and Sexual Activity  . Alcohol use: Yes    Alcohol/week: 7.0 standard drinks of alcohol    Types: 2 Glasses of wine, 5 Shots of liquor per week  . Drug use: No  .  Sexual activity: Not Currently    Partners: Female  Other Topics Concern  . Not on file  Social History Narrative  . Not on file   Social Drivers of Health   Financial Resource Strain: Low Risk  (05/09/2023)   Received from [facility]   Overall Financial Resource Strain (CARDIA)   . Difficulty of Paying Living Expenses: Not hard at all  Food Insecurity: No Food Insecurity (07/18/2023)   Hunger Vital Sign   . Worried About Programme researcher, broadcasting/film/video in the Last Year: Never true   . Ran Out of Food in the Last Year: Never true  Transportation Needs: No Transportation Needs (06/21/2023)   PRAPARE -  Transportation   . Lack of Transportation (Medical): No   . Lack of Transportation (Non-Medical): No  Physical Activity: Not on file  Stress: Not on file  Social Connections: Not on file  Intimate Partner Violence: Not At Risk (07/18/2023)   Humiliation, Afraid, Rape, and Kick questionnaire   . Fear of Current or Ex-Partner: No   . Emotionally Abused: No   . Physically Abused: No   . Sexually Abused: No    Family History  Problem Relation Age of Onset  . Heart disease Mother   . Heart Problems Father      Current Outpatient Medications:  .  atorvastatin (LIPITOR) 20 MG tablet, Take 20 mg by mouth at bedtime., Disp: , Rfl: 11 .  Cholecalciferol 1000 units capsule, Take 2,000 Units by mouth at bedtime., Disp: , Rfl:  .  ferrous sulfate 325 (65 FE) MG tablet, Take 1 tablet (325 mg total) by mouth daily with breakfast., Disp: 90 tablet, Rfl: 0 .  folic acid (FOLVITE) 1 MG tablet, Take 2 mg by mouth in the morning., Disp: , Rfl:  .  gabapentin (NEURONTIN) 300 MG capsule, Take 300 mg by mouth in the morning and at bedtime., Disp: , Rfl:  .  metoprolol tartrate (LOPRESSOR) 25 MG tablet, Take 0.5 tablets (12.5 mg total) by mouth 2 (two) times daily., Disp: 90 tablet, Rfl: 0 .  Octreotide Acetate 100 MCG/ML SOSY, Inject 1 mL (100 mcg total) into the skin every 12 (twelve) hours. Prefilled Pens- Will call pharmacy with goodrx number, Disp: 90 mL, Rfl: 1 .  omega-3 acid ethyl esters (LOVAZA) 1 g capsule, Take 1 g by mouth in the morning and at bedtime., Disp: , Rfl: 3 .  pantoprazole (PROTONIX) 40 MG injection, Inject 40 mg into the vein every 12 (twelve) hours., Disp: 1 each, Rfl: 0 .  polyethylene glycol (MIRALAX / GLYCOLAX) 17 g packet, Take 17 g by mouth daily., Disp: 14 each, Rfl: 0 .  spironolactone (ALDACTONE) 100 MG tablet, Take by mouth., Disp: , Rfl:  .  tamsulosin (FLOMAX) 0.4 MG CAPS capsule, Take 1 capsule (0.4 mg total) by mouth daily., Disp: 30 capsule, Rfl: 6 .  triamcinolone  ointment (KENALOG) 0.1 %, Apply 1 Application topically 2 (two) times daily as needed (leg irritation/rash.)., Disp: , Rfl:  .  vitamin B-12 (CYANOCOBALAMIN) 1000 MCG tablet, Take 1,000 mcg by mouth at bedtime., Disp: , Rfl:  .  vitamin C (ASCORBIC ACID) 500 MG tablet, Take 500 mg by mouth at bedtime., Disp: , Rfl:   No results found.  No images are attached to the encounter.      Latest Ref Rng & Units 07/18/2023    2:51 PM  CMP  Glucose 70 - 99 mg/dL 469   BUN 8 - 23 mg/dL  14   Creatinine 0.61 - 1.24 mg/dL 1.91   Sodium 478 - 295 mmol/L 138   Potassium 3.5 - 5.1 mmol/L 4.5   Chloride 98 - 111 mmol/L 103   CO2 22 - 32 mmol/L 26   Calcium 8.9 - 10.3 mg/dL 9.2   Total Protein 6.5 - 8.1 g/dL 7.3   Total Bilirubin <6.2 mg/dL 0.7   Alkaline Phos 38 - 126 U/L 99   AST 15 - 41 U/L 18   ALT 0 - 44 U/L 13       Latest Ref Rng & Units 07/18/2023    2:51 PM  CBC  WBC 4.0 - 10.5 K/uL 5.1   Hemoglobin 13.0 - 17.0 g/dL 13.0   Hematocrit 86.5 - 52.0 % 35.6   Platelets 150 - 400 K/uL 324      Observation/objective:  Assessment and plan:  Follow-up instructions:  I discussed the assessment and treatment plan with the patient. The patient was provided an opportunity to ask questions and all were answered. The patient agreed with the plan and demonstrated an understanding of the instructions.   The patient was advised to call back or seek an in-person evaluation if the symptoms worsen or if the condition fails to improve as anticipated.  I provided *** minutes of {Blank single:19197::"face-to-face video visit time","non face-to-face telephone visit time"} during this encounter, and > 50% was spent counseling as documented under my assessment & plan.  Visit Diagnosis: No diagnosis found.  Dr. Owens Shark, MD, MPH [facility] at [facility] Tel- [phone] 07/27/2023 8:25 AM

## 2023-07-30 NOTE — Addendum Note (Signed)
Addended by: Richardson Chiquito on: 07/30/2023 12:04 PM   Modules accepted: Orders

## 2023-07-30 NOTE — Telephone Encounter (Addendum)
I have spoken to patient/wife to advise that while we are working on insurance appeal for octreotide, we will get a repeat blood count to make certain that hemoglobin is stable. Hgb at blood count 07/18/23 was 10.8. Patient is already scheduled for follow up with Dr Santa Lighter on 08/08/23 which would be about 2 weeks from last check. Patient can have repeat labs at the time of that visit. Patient states that they have been able to get octreotide by paying out of pocket thus far, so he is getting the medication even if not approved by insurance.  Appeal is sent to:  [facility] Provider Appeal Department [address] [place], [place] [address]  Fax:  Medical necessity/administrative denials: [phone]  Provider [facility] Phone [phone]

## 2023-08-02 ENCOUNTER — Ambulatory Visit: Payer: BC Managed Care – PPO | Admitting: Gastroenterology

## 2023-08-07 ENCOUNTER — Inpatient Hospital Stay: Payer: BC Managed Care – PPO | Attending: Oncology

## 2023-08-07 VITALS — BP 134/70 | HR 66 | Temp 96.4°F | Resp 17

## 2023-08-07 DIAGNOSIS — R5383 Other fatigue: Secondary | ICD-10-CM | POA: Insufficient documentation

## 2023-08-07 DIAGNOSIS — D508 Other iron deficiency anemias: Secondary | ICD-10-CM

## 2023-08-07 DIAGNOSIS — D631 Anemia in chronic kidney disease: Secondary | ICD-10-CM | POA: Insufficient documentation

## 2023-08-07 DIAGNOSIS — D509 Iron deficiency anemia, unspecified: Secondary | ICD-10-CM | POA: Diagnosis present

## 2023-08-07 DIAGNOSIS — N183 Chronic kidney disease, stage 3 unspecified: Secondary | ICD-10-CM | POA: Diagnosis not present

## 2023-08-07 MED ORDER — SODIUM CHLORIDE 0.9% FLUSH
10.0000 mL | Freq: Once | INTRAVENOUS | Status: DC | PRN
  Filled 2023-08-07: qty 10

## 2023-08-07 MED ORDER — SODIUM CHLORIDE 0.9 % IV SOLN
510.0000 mg | INTRAVENOUS | Status: DC
  Administered 2023-08-07: 510 mg via INTRAVENOUS
  Filled 2023-08-07: qty 510

## 2023-08-07 MED ORDER — SODIUM CHLORIDE 0.9 % IV SOLN
INTRAVENOUS | Status: DC
  Filled 2023-08-07: qty 250

## 2023-08-07 NOTE — Progress Notes (Signed)
 Patient tolerated initial Feraheme infusion well, no questions/concerns voiced. Monitored 30 min post transfusion. Patient stable at discharge. AVS given.

## 2023-08-07 NOTE — Patient Instructions (Signed)
 Ferumoxytol Injection What is this medication? FERUMOXYTOL (FER ue MOX i tol) treats low levels of iron in your body (iron deficiency anemia). Iron is a mineral that plays an important role in making red blood cells, which carry oxygen from your lungs to the rest of your body. This medicine may be used for other purposes; ask your health care provider or pharmacist if you have questions. COMMON BRAND NAME(S): Feraheme What should I tell my care team before I take this medication? They need to know if you have any of these conditions: Anemia not caused by low iron levels High levels of iron in the blood Magnetic resonance imaging (MRI) test scheduled An unusual or allergic reaction to iron, other medications, foods, dyes, or preservatives Pregnant or trying to get pregnant Breastfeeding How should I use this medication? This medication is injected into a vein. It is given by your care team in a hospital or clinic setting. Talk to your care team the use of this medication in children. Special care may be needed. Overdosage: If you think you have taken too much of this medicine contact a poison control center or emergency room at once. NOTE: This medicine is only for you. Do not share this medicine with others. What if I miss a dose? It is important not to miss your dose. Call your care team if you are unable to keep an appointment. What may interact with this medication? Other iron products This list may not describe all possible interactions. Give your health care provider a list of all the medicines, herbs, non-prescription drugs, or dietary supplements you use. Also tell them if you smoke, drink alcohol, or use illegal drugs. Some items may interact with your medicine. What should I watch for while using this medication? Visit your care team regularly. Tell your care team if your symptoms do not start to get better or if they get worse. You may need blood work done while you are taking this  medication. You may need to follow a special diet. Talk to your care team. Foods that contain iron include: whole grains/cereals, dried fruits, beans, or peas, leafy green vegetables, and organ meats (liver, kidney). What side effects may I notice from receiving this medication? Side effects that you should report to your care team as soon as possible: Allergic reactions--skin rash, itching, hives, swelling of the face, lips, tongue, or throat Low blood pressure--dizziness, feeling faint or lightheaded, blurry vision Shortness of breath Side effects that usually do not require medical attention (report to your care team if they continue or are bothersome): Flushing Headache Joint pain Muscle pain Nausea Pain, redness, or irritation at injection site This list may not describe all possible side effects. Call your doctor for medical advice about side effects. You may report side effects to FDA at 1-800-FDA-1088. Where should I keep my medication? This medication is given in a hospital or clinic. It will not be stored at home. NOTE: This sheet is a summary. It may not cover all possible information. If you have questions about this medicine, talk to your doctor, pharmacist, or health care provider.  2024 Elsevier/Gold Standard (2022-12-22 00:00:00)

## 2023-08-08 ENCOUNTER — Encounter: Payer: Self-pay | Admitting: Gastroenterology

## 2023-08-08 ENCOUNTER — Ambulatory Visit: Payer: BC Managed Care – PPO | Admitting: Gastroenterology

## 2023-08-08 VITALS — BP 126/72 | HR 64 | Ht 68.0 in | Wt 183.2 lb

## 2023-08-08 DIAGNOSIS — D509 Iron deficiency anemia, unspecified: Secondary | ICD-10-CM

## 2023-08-08 DIAGNOSIS — K5521 Angiodysplasia of colon with hemorrhage: Secondary | ICD-10-CM

## 2023-08-08 DIAGNOSIS — D62 Acute posthemorrhagic anemia: Secondary | ICD-10-CM

## 2023-08-08 NOTE — Progress Notes (Signed)
 Chief Complaint:    Hospital follow-up  GI History: 78 year old male with a past medical history significant for hypertension, hyperlipidemia, CAD, BPH, stage IIIb kidney disease, B12 deficiency, leukopenia, bilateral hip avascular necrosis/osteoarthritis, aortic atherosclerosis, prediabetes, chronic prostatitis, peripheral neuropathy, Barrett's Esophagus.  Hospital admission in 05/2028 with massive GI bleed.  Was initially admitted to [facility] for active GI bleed and acute blood loss anemia.  Underwent extensive evaluation to include EGD (5 cm Barrett's Esophagus), colonoscopy (internal hemorrhoids, nonbleeding diverticulosis), video capsule endoscopy (negative).  Initial CTA on 11/19 was negative.  Repeat CTA 11/20 showed active small bowel bleed in the upper abdomen, but subsequent IR evaluation but no obvious extravasation or source for embolization.  Was transferred to [facility] for suspected recurrent, brisk small bowel bleeding.  Single balloon enteroscopy with 2 small, nonbleeding duodenal AVMs treated with APC.  Was started on octreotide .  Meckel's scan was normal/negative.  Repeat video capsule endoscopy did not advance beyond the stomach.  Third video capsule endoscopy via endoscopic placement revealed small bowel AVM without active bleeding.  Received 14 units PRBCs during hospital admission with discharge hemoglobin 7.6.  HPI:     Patient is a 78 y.o. male presenting to the Gastroenterology Clinic for follow-up.   He was prescribed octreotide  at hospital discharge, but has not yet had approval for LAR injection.  He had follow-up with his PCM yesterday.  No further bleeding.  No longer taking Celebrex, aspirin.  Repeat labs on 07/18/2023 with uptrending H/H at 10.8/35.6, ferritin 37, iron 160, sat 41%.  Normal B12 and folate.  He had follow-up in the Hematology Clinic on 07/26/2023, and additional IV iron infusion yesterday, and 1 more next week.   Today, he states he is  feeling mostly back to his usual state of health.  No recurrent bleeding.  Using octreotide  100 mcg twice daily.    Summary of recent evaluation for source of GI bleeding: EGD (Barrett's Esophagus) and colonoscopy without source of bleeding found/done at [facility] prior to transfer Capsule endoscopy prior to transfer showed brisk active bleeding and what appeared to be the mid small bowel, and there was also question of additional bleeding site in the distal small bowel within a couple of minutes of the cecum   Transferred here for single balloon Single balloon enteroscopy 06/22/2023, no source found, no active bleeding, 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 Jejunal tattoo seen between 01:53:20 and 04:03:32. 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 Maroon colored stool seen in the cecum.     Review of systems:     No chest pain, no SOB, no fevers, no urinary sx   Past Medical History:  Diagnosis Date   Allergic rhinitis    Anemia    Arthritis    Avascular necrosis of bones of both hips (HCC)    B12 deficiency    Barrett's esophagus without dysplasia    Bilateral inguinal hernia    Bilateral renal cysts    CAD (coronary artery disease)    Chronic prostatitis    DDD (degenerative disc disease), lumbar    Enlarged prostate    Erectile dysfunction    a.) on PDE5i (sildenafil)   GERD (gastroesophageal reflux disease)    History of bilateral cataract extraction 2020   HLD (hyperlipidemia)  HTN (hypertension)    Hypertension    LBBB (left bundle branch block)    Leukopenia    Long term current use of aspirin    OSA on CPAP    Peripheral neuropathy    Prediabetes    Skin cancer    Substance abuse (HCC)    Vitamin D  deficiency     Patient's surgical history, family medical history, social history,  medications and allergies were all reviewed in Epic    Current Outpatient Medications  Medication Sig Dispense Refill   atorvastatin  (LIPITOR) 20 MG tablet Take 20 mg by mouth at bedtime.  11   Cholecalciferol  1000 units capsule Take 2,000 Units by mouth at bedtime.     ferrous sulfate  325 (65 FE) MG tablet Take 1 tablet (325 mg total) by mouth daily with breakfast. 90 tablet 0   folic acid  (FOLVITE ) 1 MG tablet Take 2 mg by mouth in the morning.     gabapentin  (NEURONTIN ) 300 MG capsule Take 300 mg by mouth in the morning and at bedtime.     metoprolol  tartrate (LOPRESSOR ) 25 MG tablet Take 0.5 tablets (12.5 mg total) by mouth 2 (two) times daily. 90 tablet 0   Octreotide  Acetate 100 MCG/ML SOSY Inject 1 mL (100 mcg total) into the skin every 12 (twelve) hours. Prefilled Pens- Will call pharmacy with goodrx number 90 mL 1   omega-3 acid ethyl esters (LOVAZA ) 1 g capsule Take 1 g by mouth in the morning and at bedtime.  3   pantoprazole  (PROTONIX ) 40 MG injection Inject 40 mg into the vein every 12 (twelve) hours. 1 each 0   polyethylene glycol (MIRALAX  / GLYCOLAX ) 17 g packet Take 17 g by mouth daily. 14 each 0   spironolactone (ALDACTONE) 100 MG tablet Take by mouth.     tamsulosin  (FLOMAX ) 0.4 MG CAPS capsule Take 1 capsule (0.4 mg total) by mouth daily. 30 capsule 6   triamcinolone ointment (KENALOG) 0.1 % Apply 1 Application topically 2 (two) times daily as needed (leg irritation/rash.).     vitamin B-12 (CYANOCOBALAMIN ) 1000 MCG tablet Take 1,000 mcg by mouth at bedtime.     vitamin C  (ASCORBIC ACID ) 500 MG tablet Take 500 mg by mouth at bedtime.     No current facility-administered medications for this visit.    Physical Exam:     Ht 5' 8 (1.727 m)   Wt 183 lb 3.2 oz (83.1 kg)   BMI 27.86 kg/m   GENERAL:  Pleasant male in NAD PSYCH: : Cooperative, normal affect NEURO: Alert and oriented x 3, no focal neurologic deficits   IMPRESSION and PLAN:    1) Small bowel AVM 2)  Acute blood loss anemia-improving 3) Iron deficiency anemia-improving 78 year old male with recent hospitalization for significant small bowel bleed.  He underwent extensive evaluation to include multiple CT angiography's, video capsule endoscopy x 2, EGD, colonoscopy, single balloon enteroscopy, tagged RBC scan, and >14 units RBCs.  Since discharge, has been maintained on octreotide  bid, and no recurrence of bleeding.  Discussed hospital course, AVM disease, and risk of rebleeding at length with patient and spouse today.  Certainly no guarantee that bleeding cannot recur, but we are hopeful.  Discussed the role/utility of the octreotide  and how this has the potential to reduce the risk of rebleeding, but does not completely prevent formation of AVMs or prevent rebleed completely.  He understands this very well and would very much like to continue with octreotide .  - Continue octreotide  -  Continue appeals process for LAR injection - Repeat CBC check in 2 weeks - IV iron next week as scheduled then repeat iron panel in 3 months - Continue follow-up in the Hematology Clinic - To contact me immediately if rebleeding.  If rebleed, to go to the ER with stat CT angiography to try to localize.  If angiography unrevealing, may need small bowel enteroscopy vs repeat urgent VCE - Avoiding NSAIDs.  Tylenol  okay for arthritis  RTC in 6 months or sooner as needed     I spent 32 minutes of time, including in depth chart review, independent review of results as outlined above, communicating results with the patient directly, face-to-face time with the patient, coordinating care, and ordering studies and medications as appropriate, and documentation.      Sandor LULLA Flatter ,DO, FACG 08/08/2023, 10:19 AM

## 2023-08-08 NOTE — Patient Instructions (Addendum)
 _______________________________________________________  If your blood pressure at your visit was 140/90 or greater, please contact your primary care physician to follow up on this. _______________________________________________________  If you are age 77 or older, your body mass index should be between 23-30. Your Body mass index is 27.86 kg/m. If this is out of the aforementioned range listed, please consider follow up with your Primary Care Provider. ________________________________________________________  [facility] providers would like to encourage you to use MYCHART to communicate with providers for non-urgent requests or questions.  Due to long hold times on the telephone, sending your provider a message by [facility] may be a faster and more efficient way to get a response.  Please allow 48 business hours for a response.  Please remember that this is for non-urgent requests.  _______________________________________________________  [name] will need a follow up in 6 months.  We will contact you to schedule this appointment.  Your provider has requested that you have lab work in 2 weeks (08-22-23).  Due to recent changes in healthcare laws, you may see the results of your imaging and laboratory studies on MyChart before your provider has had a chance to review them.  We understand that in some cases there may be results that are confusing or concerning to you. Not all laboratory results come back in the same time frame and the provider may be waiting for multiple results in order to interpret others.  Please give us  48 hours in order for your provider to thoroughly review all the results before contacting the office for clarification of your results.   It was a pleasure to see you today!  Vito Cirigliano, D.O.

## 2023-08-12 ENCOUNTER — Encounter: Payer: Self-pay | Admitting: Oncology

## 2023-08-13 ENCOUNTER — Inpatient Hospital Stay: Payer: BC Managed Care – PPO

## 2023-08-13 VITALS — BP 148/78 | HR 62 | Temp 97.2°F | Resp 16

## 2023-08-13 DIAGNOSIS — D508 Other iron deficiency anemias: Secondary | ICD-10-CM

## 2023-08-13 DIAGNOSIS — D509 Iron deficiency anemia, unspecified: Secondary | ICD-10-CM | POA: Diagnosis not present

## 2023-08-13 MED ORDER — SODIUM CHLORIDE 0.9 % IV SOLN
Freq: Once | INTRAVENOUS | Status: AC
Start: 2023-08-13 — End: 2023-08-13
  Filled 2023-08-13: qty 250

## 2023-08-13 MED ORDER — SODIUM CHLORIDE 0.9 % IV SOLN
510.0000 mg | INTRAVENOUS | Status: DC
  Administered 2023-08-13: 510 mg via INTRAVENOUS
  Filled 2023-08-13: qty 510

## 2023-08-30 ENCOUNTER — Other Ambulatory Visit
Admission: RE | Admit: 2023-08-30 | Discharge: 2023-08-30 | Disposition: A | Discharge status: Home / Self Care | Payer: BC Managed Care – PPO | Attending: Gastroenterology | Admitting: Gastroenterology

## 2023-08-30 ENCOUNTER — Encounter: Payer: Self-pay | Admitting: Gastroenterology

## 2023-08-30 DIAGNOSIS — D649 Anemia, unspecified: Secondary | ICD-10-CM | POA: Insufficient documentation

## 2023-08-30 DIAGNOSIS — W449XXS Unspecified foreign body entering into or through a natural orifice, sequela: Secondary | ICD-10-CM | POA: Diagnosis not present

## 2023-08-30 DIAGNOSIS — T189XXS Foreign body of alimentary tract, part unspecified, sequela: Secondary | ICD-10-CM | POA: Insufficient documentation

## 2023-08-30 LAB — CBC WITH DIFFERENTIAL/PLATELET
Abs Immature Granulocytes: 0.01 10*3/uL (ref 0.00–0.07)
Basophils Absolute: 0.1 10*3/uL (ref 0.0–0.1)
Basophils Relative: 1 %
Eosinophils Absolute: 0.3 10*3/uL (ref 0.0–0.5)
Eosinophils Relative: 6 %
HCT: 40.1 % (ref 39.0–52.0)
Hemoglobin: 13.4 g/dL (ref 13.0–17.0)
Immature Granulocytes: 0 %
Lymphocytes Relative: 27 %
Lymphs Abs: 1.2 10*3/uL (ref 0.7–4.0)
MCH: 31.6 pg (ref 26.0–34.0)
MCHC: 33.4 g/dL (ref 30.0–36.0)
MCV: 94.6 fL (ref 80.0–100.0)
Monocytes Absolute: 0.6 10*3/uL (ref 0.1–1.0)
Monocytes Relative: 13 %
Neutro Abs: 2.3 10*3/uL (ref 1.7–7.7)
Neutrophils Relative %: 53 %
Platelets: 179 10*3/uL (ref 150–400)
RBC: 4.24 MIL/uL (ref 4.22–5.81)
RDW: 16.1 % — ABNORMAL HIGH (ref 11.5–15.5)
WBC: 4.5 10*3/uL (ref 4.0–10.5)
nRBC: 0 % (ref 0.0–0.2)

## 2023-08-30 NOTE — Addendum Note (Signed)
Addended by: Clementeen Graham on: 08/30/2023 03:26 PM   Modules accepted: Orders

## 2023-09-03 NOTE — Telephone Encounter (Signed)
PT returning call regarding lab work. Wife advised that he will be seeing a hematologist in April and wants to know if he still needs to have it done or can it be done at that appointment. She also wants to know if the appeal was granted. Please advise.

## 2023-09-17 ENCOUNTER — Telehealth: Payer: Self-pay | Admitting: Gastroenterology

## 2023-09-17 ENCOUNTER — Telehealth: Payer: Self-pay | Admitting: *Deleted

## 2023-09-17 NOTE — Telephone Encounter (Signed)
Dr C-  Mrs.Profitt says you gave her an "AVM protocol" to carry should Mr.Fretz have a bleed while traveling. She says that she has misplaced this an wonders if we could send her the instructions again. Can you help with this?

## 2023-09-17 NOTE — Telephone Encounter (Signed)
Inbound call from patient's wife requesting a call to discuss AVM protocol. States she recently got a new phone and believes she accidentally deleted protocol that was sent. Please advise, thank you.

## 2023-09-17 NOTE — Telephone Encounter (Signed)
Contacted [employer] to inquire again about appeal first faxed on 07/30/23 to fax [phone] (as indicated by pharmacist who refused to complete peer to peer with our physician). At phone inquiry on 09/03/23, I was told no appeal was on file and I must file a "Doctor Claim Inquiry" form along with appeal letter to fax [phone] (as indicated on their website form). This was faxed successfully on 09/04/23 at 9:58 am. Per Marybelle Killings., intake specialist, still no forms or letters are on file though he can see that something was sent on 07/30/23. He instead says we should fax supporting records, labs and necessity letter to fax number [phone] for a quicker turn around time. Phone number for pharmacy Intake is [phone] opt 3, then opt 1.

## 2023-09-17 NOTE — Telephone Encounter (Signed)
I do not see labs from GI and I looked up the MD and it is a Kempner so I am looking at all labs. The last lab 1/30 cbc/d. You have him coming 2/18 for cbc, ferritin, and IIBC . I feel he needs to come?

## 2023-09-17 NOTE — Telephone Encounter (Signed)
Iron studies were not checked in jan. But since his hemoglobin looks a lot better we can cancel tomorrows labs and keep appts in April as planned

## 2023-09-18 ENCOUNTER — Inpatient Hospital Stay: Payer: BC Managed Care – PPO

## 2023-09-18 NOTE — Telephone Encounter (Signed)
I have faxed 70 pages of "supporting records" as well as a copy of appeal letter sent 07/30/23 and doctor claim inquiry sent again on 09/04/23 to fax number [phone] (with successful fax confirmation 11:29 am 07/17/24) for reconsideration of octreotide.

## 2023-09-19 ENCOUNTER — Telehealth: Payer: Self-pay | Admitting: Gastroenterology

## 2023-09-19 NOTE — Telephone Encounter (Signed)
I have spoken to [place] , pharmacy intake with [employer] who states that she has confirmed that another "peer to peer" is not needed at this time because a peer to peer was completed 2 days ago. I advised that I contacted insurance to request an update on the appeal we sent, but nobody has spoken with the provider since 07/18/23 (as confirmed by Dr Barron Alvine). I am told "well, per NOTES, provider did a peer to peer." Advised the next thing that is needed is an appeal letter, appeal form and fax back to [phone]. I relayed the fact that I have been told something different every single time I have called and am now told that again, they have not gotten any of our faxes (despite several successful faxed confirmations).  Dr Barron Alvine currently states that he is working to speak with the medical director at this time.

## 2023-09-19 NOTE — Telephone Encounter (Signed)
Inbound call from patients wife stating that she had spoke with [facility] and they are requesting a peer review and provided me with a telephone number as [phone].  Please advise.

## 2023-09-20 NOTE — Telephone Encounter (Signed)
 Harry Patterson

## 2023-09-26 NOTE — Telephone Encounter (Signed)
 We have received appropriate appeals forms from [facility] and Dr Barron Alvine has asked that the following note be placed in the "reason for appeal" portion of the form:  "Harry Patterson was admitted to the hospital in 06/2023 with massive GI bleed requiring 14 units of red blood cell transfusion and extensive radiographic and endoscopic evaluation.  Ultimately determined that the bleed was from a small bowel arteriovenous malformation (AVM) which was treated endoscopically.  He was then started on octreotide to help reduce chance of rebleeding.  He has been maintained on octreotide since hospital discharge and has been improving clinically.  Octreotide has been used for years to treat patients with GI bleeds.  This is very typically used in patients with variceal bleed, but has clear benefit in patients with small bowel arteriovenous malformations.  There are multiple studies demonstrating the efficacy of octreotide in these situations, showing a reduction in rebleeding, repeat hospitalizations, and blood transfusion requirements.  I understand that [employer] has a policy in place regarding octreotide, however I am requesting formal appeal and a review of the updated literature which clearly shows benefit in patients with a known history of small bowel arteriovenous malformations with bleeding history."  This information has been placed on the appeal form in addition to original appeal letter from 07/30/23 and 73 pages of medical records. Patient must sign this appeals form in before [employer] will accept.  I have spoken to [name] who says that both she and patient has spoken and they will no longer be pursuing insurance coverage of octreotide due to the immense amount of time it has taken of both the medical team and them only to get no results.  I advised that I do have the appeal form with records ready to be sent to [facility] if they decide to move forward with signature for one last  attempt at an external appeal. She verbalizes understanding and says they may come within the next 1-2 weeks after returning from an out of town trip.

## 2023-09-27 NOTE — Telephone Encounter (Signed)
 Inbound call from patient's wife stating they will be in today to fill out necessary forms. Please advise, thank you

## 2023-09-27 NOTE — Telephone Encounter (Signed)
 Noted.

## 2023-09-27 NOTE — Telephone Encounter (Signed)
 76 pages of records, appeals forms and appeal letter faxed to [facility] appeals at [phone]. Faxed confirmation successful.

## 2023-10-01 ENCOUNTER — Telehealth: Payer: Self-pay | Admitting: Gastroenterology

## 2023-10-01 NOTE — Telephone Encounter (Signed)
 Patient's wife called requesting to speak with a nurse stating patient is taking the Octreotide Acetate injection but is having some breath tenderness and it's painful when you touch it. Patient's wife is wondering if it's a side effect. Please advise.

## 2023-10-01 NOTE — Telephone Encounter (Signed)
 Dr Barron Alvine,  Is breast tenderness a side effect of octreotide?

## 2023-10-02 NOTE — Telephone Encounter (Signed)
 Left message for patient to call back

## 2023-10-02 NOTE — Telephone Encounter (Signed)
 I have spoken to Erskine Squibb, patient's wife to advise that per Dr Barron Alvine, gynecomastia is reported in less than 1% of patient's taking octreotide, but no breast tenderness is noted. Advised that if patient is having breast tenderness/pain, he contact his primary care physician for further evaluation. Mrs. selso mannor understanding of this information.

## 2023-10-04 ENCOUNTER — Telehealth: Payer: Self-pay | Admitting: Gastroenterology

## 2023-10-04 DIAGNOSIS — K5521 Angiodysplasia of colon with hemorrhage: Secondary | ICD-10-CM

## 2023-10-04 DIAGNOSIS — D509 Iron deficiency anemia, unspecified: Secondary | ICD-10-CM

## 2023-10-04 NOTE — Telephone Encounter (Signed)
 Patient wife called and stated that she had received a letter in the mail regarding her husband insurance and would like to speak to the nurse about it. Patient wife also stated that her husband last night reported that he saw a dark streak whenever he wiped. Patient wife also stated that her husband eats a lot of blueberries and was wondering if it could be the blueberries he saw when wiping. Patient wife is  requesting a call back.Please advise.

## 2023-10-04 NOTE — Telephone Encounter (Signed)
 Mrs.Borsuk calls indicating that they received a letter from [facility] regarding octreotide and request for expedited appeal. The letter states that expedited appeal is not appropriate, but they will perform review of the appeal over a 30 day period.  In addition, Mrs.Parmar states that patient had a "dark streak" in his stool yesterday. She is concerned whether this represented blood or could just be related to the large amount of blueberries he has recently been eating. We discussed that this "streak" is more likely related to food consumption than blood. If due to blood, the whole bowel movement would have likely been dark/tarry. Patient does not complain of any fatigue, dizziness, SOB, pain that would indicated rapid drop in hgb. Mrs.Geck wonders if hemoccult cards would be appropriate to double check for blood loss.  Please advise.

## 2023-10-05 ENCOUNTER — Encounter: Payer: Self-pay | Admitting: Gastroenterology

## 2023-10-05 ENCOUNTER — Other Ambulatory Visit (INDEPENDENT_AMBULATORY_CARE_PROVIDER_SITE_OTHER)

## 2023-10-05 ENCOUNTER — Other Ambulatory Visit

## 2023-10-05 DIAGNOSIS — D509 Iron deficiency anemia, unspecified: Secondary | ICD-10-CM

## 2023-10-05 DIAGNOSIS — K5521 Angiodysplasia of colon with hemorrhage: Secondary | ICD-10-CM | POA: Diagnosis not present

## 2023-10-05 LAB — CBC WITH DIFFERENTIAL/PLATELET
Basophils Absolute: 0 10*3/uL (ref 0.0–0.1)
Basophils Relative: 0.4 % (ref 0.0–3.0)
Eosinophils Absolute: 0.3 10*3/uL (ref 0.0–0.7)
Eosinophils Relative: 5.7 % — ABNORMAL HIGH (ref 0.0–5.0)
HCT: 43.3 % (ref 39.0–52.0)
Hemoglobin: 14.5 g/dL (ref 13.0–17.0)
Lymphocytes Relative: 24.2 % (ref 12.0–46.0)
Lymphs Abs: 1.1 10*3/uL (ref 0.7–4.0)
MCHC: 33.5 g/dL (ref 30.0–36.0)
MCV: 96.3 fl (ref 78.0–100.0)
Monocytes Absolute: 0.6 10*3/uL (ref 0.1–1.0)
Monocytes Relative: 13.7 % — ABNORMAL HIGH (ref 3.0–12.0)
Neutro Abs: 2.6 10*3/uL (ref 1.4–7.7)
Neutrophils Relative %: 56 % (ref 43.0–77.0)
Platelets: 178 10*3/uL (ref 150.0–400.0)
RBC: 4.5 Mil/uL (ref 4.22–5.81)
RDW: 16.3 % — ABNORMAL HIGH (ref 11.5–15.5)
WBC: 4.7 10*3/uL (ref 4.0–10.5)

## 2023-10-05 NOTE — Telephone Encounter (Signed)
 Patient wife called and stated that she was on her way to our clinic in order to give the lab a stool sample. Please advise.

## 2023-10-05 NOTE — Telephone Encounter (Signed)
 I have spoken to Dr Barron Alvine who says that there's no reason for a stool card for patient. Even if positive, it wouldn't truly matter if no anemia, sxs, etc. He says if patient is concerned about bleeding, he is okay with a repeat CBC as a much better marker of whether or not someone has clinically relevant bleeding.   Patient is advised that he may go to the lab for bloodwork but no stool specimen needed. Patient says he has pictures of dark stool and will upload them to mychart. Did have some bloating last night but no other symptoms.

## 2023-10-15 ENCOUNTER — Telehealth: Payer: Self-pay | Admitting: *Deleted

## 2023-10-15 ENCOUNTER — Other Ambulatory Visit: Payer: Self-pay | Admitting: Internal Medicine

## 2023-10-15 DIAGNOSIS — N644 Mastodynia: Secondary | ICD-10-CM

## 2023-10-15 NOTE — Telephone Encounter (Signed)
 The wife wants only 1 to get cbc and only 1 stick. According I see that he will get cbc, ferritin and IIBC with [facility] and I can't see about labs from dr DR. Graciela Husbands

## 2023-10-16 ENCOUNTER — Telehealth: Payer: Self-pay | Admitting: *Deleted

## 2023-10-16 ENCOUNTER — Encounter: Payer: Self-pay | Admitting: Oncology

## 2023-10-16 NOTE — Telephone Encounter (Signed)
 Wife called tosay that pt has labs from [facility]  on 7/3 and Dr. [facility] on 4/11 and she wanted the labs to do together and not have him stuck twice. I got in touch with the PCP and they will do the labs that rao needs and he will get stuck once. I have cancelled the appt 4/11 and he will just come in to see [facility] on 4/11 and wife knows the plan

## 2023-11-06 ENCOUNTER — Ambulatory Visit
Admission: RE | Admit: 2023-11-06 | Discharge: 2023-11-06 | Disposition: A | Discharge status: Home / Self Care | Source: Ambulatory Visit | Attending: Internal Medicine | Admitting: Internal Medicine

## 2023-11-06 ENCOUNTER — Ambulatory Visit: Admission: RE | Admit: 2023-11-06 | Source: Ambulatory Visit

## 2023-11-06 DIAGNOSIS — N644 Mastodynia: Secondary | ICD-10-CM | POA: Insufficient documentation

## 2023-11-09 ENCOUNTER — Other Ambulatory Visit: Payer: Self-pay | Admitting: Gastroenterology

## 2023-11-09 ENCOUNTER — Other Ambulatory Visit: Payer: BC Managed Care – PPO

## 2023-11-09 ENCOUNTER — Inpatient Hospital Stay: Payer: BC Managed Care – PPO | Attending: Oncology | Admitting: Nurse Practitioner

## 2023-11-09 ENCOUNTER — Encounter: Payer: Self-pay | Admitting: Nurse Practitioner

## 2023-11-09 VITALS — BP 149/72 | HR 60 | Temp 96.6°F | Resp 16 | Wt 195.0 lb

## 2023-11-09 DIAGNOSIS — D508 Other iron deficiency anemias: Secondary | ICD-10-CM | POA: Diagnosis not present

## 2023-11-09 DIAGNOSIS — K5521 Angiodysplasia of colon with hemorrhage: Secondary | ICD-10-CM | POA: Diagnosis not present

## 2023-11-09 DIAGNOSIS — D500 Iron deficiency anemia secondary to blood loss (chronic): Secondary | ICD-10-CM | POA: Diagnosis present

## 2023-11-09 NOTE — Progress Notes (Signed)
 Hematology/Oncology Consult Note [facility] [phone] [phone]  Patient Care Team: Lynnea Ferrier, MD as PCP - General (Internal Medicine) Creig Hines, MD as Consulting Physician (Oncology)   Name of the patient: Harry Patterson  [id number]  04-18-1946   Reason for referral- anemia of chronic kidney disease   Referring physician- Dr. Wynelle Link  Date of visit: 11/09/23  History of presenting illness- Patient is a 78 year old male with a past medical history significant for hypertension, CAD, BPH, stage IIIb kidney disease referred for anemia CBC from 07/09/2023 showed white count of 3.4, H&H of 9.1/29.3 and a platelet count of 306.  Serum creatinine was normal at 0.9.  Hepatitis B and C testing negative.  His hemoglobin has been around 7 since November 2024 when he was admitted to the hospital for symptoms of GI bleed.  He had EGD which showed Barrett's esophagus but no source of bleeding.  Colonoscopy showed hemorrhoids and diverticulosis.  Capsule endoscopy negative.  Since Meckel scan was negative.  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  Single lumen enteroscopy was done which showed 2 small AVMs which were cauterized as well.  He received PRBC transfusion about 14 units during his hospital stay.  Currently patient reports improvement in his energy levels although he does not feel quite back to his baseline.  Denies any blood loss in his stool or urine.  Denies any dark melanotic stool  Interval History: Patient is 78 year old male with above history of iron deficiency anemia due to GI bleed who returns to clinic for follow up. He had labs with his pcp and provides printed copies for review. Tolerated feraheme well without significant side effects. He continues oral iron. No diarrhea, constipation, or abdominal discomfort.   ECOG  PS- 1 Pain scale- 0  Review of systems- Review of Systems  Constitutional:  Negative for chills, fever, malaise/fatigue and weight loss.  HENT:  Negative for congestion, ear discharge and nosebleeds.   Eyes:  Negative for blurred vision.  Respiratory:  Negative for cough, hemoptysis, sputum production, shortness of breath and wheezing.   Cardiovascular:  Negative for chest pain, palpitations, orthopnea and claudication.  Gastrointestinal:  Negative for abdominal pain, blood in stool, constipation, diarrhea, heartburn, melena, nausea and vomiting.  Genitourinary:  Negative for dysuria, flank pain, frequency, hematuria and urgency.  Musculoskeletal:  Negative for back pain, joint pain and myalgias.  Skin:  Negative for rash.  Neurological:  Negative for dizziness, tingling, focal weakness, seizures, weakness and headaches.  Endo/Heme/Allergies:  Does not bruise/bleed easily.  Psychiatric/Behavioral:  Negative for depression and suicidal ideas. The patient does not have insomnia.    Allergies  Allergen Reactions   Amoxicillin Other (See Comments)    Oral thrush   Other Swelling    Pneumonia vaccine-swollen/painful/fever chills   Amlodipine Other (See Comments)    EDEMA   Patient Active Problem List   Diagnosis Date Noted   Iron deficiency anemia 07/19/2023   AVM (arteriovenous malformation) of small bowel, acquired with hemorrhage 06/22/2023   ABLA (acute blood loss anemia) 06/22/2023   GI bleed 06/21/2023   Upper GI bleed 06/16/2023   Alcohol use 06/16/2023   Symptomatic anemia 06/16/2023   Suprapubic tenderness 06/16/2023   Allergic rhinitis 04/26/2018   B12 deficiency 04/26/2018   Leukopenia 04/26/2018   Barrett's esophagus without dysplasia 03/23/2017   Polyp of colon, adenomatous 03/23/2017  Aortic atherosclerosis (HCC) 03/01/2017   Avascular necrosis of bones of both hips (HCC) 03/01/2017   History of nonmelanoma skin cancer 06/14/2015   Polyneuropathy 10/29/2014    Essential hypertension 06/16/2014   Pure hypercholesterolemia 06/16/2014   LBBB (left bundle branch block) 03/19/2014   Benign prostatic hyperplasia without lower urinary tract symptoms 01/08/2014   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   Erectile dysfunction 01/08/2014   Other specified disorders of urethra 01/08/2014   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   Psychosexual dysfunction with inhibited sexual excitement 01/08/2014   Past Medical History:  Diagnosis Date   Allergic rhinitis    Anemia    Arthritis    Avascular necrosis of bones of both hips (HCC)    B12 deficiency    Barrett's esophagus without dysplasia    Bilateral inguinal hernia    Bilateral renal cysts    CAD (coronary artery disease)    Chronic prostatitis    DDD (degenerative disc disease), lumbar    Enlarged prostate    Erectile dysfunction    a.) on PDE5i (sildenafil)   GERD (gastroesophageal reflux disease)    History of bilateral cataract extraction 2020   HLD (hyperlipidemia)    HTN (hypertension)    Hypertension    LBBB (left bundle branch block)    Leukopenia    Long term current use of aspirin    OSA on CPAP    Peripheral neuropathy    Prediabetes    Skin cancer    Substance abuse (HCC)    Vitamin D deficiency    Past Surgical History:  Procedure Laterality Date   APPENDECTOMY     BALLOON ENTEROSCOPY N/A 06/22/2023   Procedure: BALLOON ENTEROSCOPY;  Surgeon: Shellia Cleverly, DO;  Location: [facility] END[facility];  Service: Gastroenterology;  Laterality: N/A;   BIOPSY  01/16/2023   Procedure: BIOPSY;  Surgeon: Regis Bill, MD;  Location: [facility] ENDOSCOPY;  Service: Endoscopy;;   CATARACT EXTRACTION W/PHACO Right 06/11/2019   Procedure: CATARACT EXTRACTION PHACO AND INTRAOCULAR LENS PLACEMENT (IOC) RIGHT 00:54.6     22.0%    12.00;  Surgeon: Lockie Mola, MD;  Location: [facility];  Service: Ophthalmology;  Laterality: Right;  sleep apnea   CATARACT EXTRACTION W/PHACO Left  06/30/2019   Procedure: CATARACT EXTRACTION PHACO AND INTRAOCULAR LENS PLACEMENT (IOC) LEFT TORIC LENS 9.97  01:09.1  14.4%;  Surgeon: Lockie Mola, MD;  Location: [facility];  Service: Ophthalmology;  Laterality: Left;   COLONOSCOPY N/A 06/18/2023   Procedure: COLONOSCOPY;  Surgeon: Toledo, Boykin Nearing, MD;  Location: [facility] ENDOSCOPY;  Service: Gastroenterology;  Laterality: N/A;   COLONOSCOPY W/ POLYPECTOMY     02/08/00,11'12'04,05/05/05, 08/21/2008, 12/05/2013, 05/08/2014   COLONOSCOPY WITH PROPOFOL N/A 06/06/2017   Procedure: COLONOSCOPY WITH PROPOFOL;  Surgeon: Scot Jun, MD;  Location: [facility] ENDOSCOPY;  Service: Endoscopy;  Laterality: N/A;   COLONOSCOPY WITH PROPOFOL N/A 01/16/2023   Procedure: COLONOSCOPY WITH PROPOFOL;  Surgeon: Regis Bill, MD;  Location: [facility] ENDOSCOPY;  Service: Endoscopy;  Laterality: N/A;   cyst removed  Right    thumb   CYSTOSCOPY WITH URETHRAL DILATATION N/A 06/05/2023   Procedure: CYSTOSCOPY WITH URETHRAL BALLOON DILATATION USING OPTILUME;  Surgeon: Riki Altes, MD;  Location: [facility] ORS;  Service: Urology;  Laterality: N/A;   ESOPHAGOGASTRODUODENOSCOPY     x7   ESOPHAGOGASTRODUODENOSCOPY N/A 06/17/2023   Procedure: ESOPHAGOGASTRODUODENOSCOPY (EGD);  Surgeon: Toledo, Boykin Nearing, MD;  Location: [facility] ENDOSCOPY;  Service: Gastroenterology;  Laterality: N/A;   ESOPHAGOGASTRODUODENOSCOPY (EGD) WITH PROPOFOL N/A 06/06/2017  Procedure: ESOPHAGOGASTRODUODENOSCOPY (EGD) WITH PROPOFOL;  Surgeon: Scot Jun, MD;  Location: [facility] ENDOSCOPY;  Service: Endoscopy;  Laterality: N/A;   ESOPHAGOGASTRODUODENOSCOPY (EGD) WITH PROPOFOL N/A 11/27/2019   Procedure: ESOPHAGOGASTRODUODENOSCOPY (EGD) WITH PROPOFOL;  Surgeon: Toledo, Boykin Nearing, MD;  Location: [facility] ENDOSCOPY;  Service: Gastroenterology;  Laterality: N/A;   ESOPHAGOGASTRODUODENOSCOPY (EGD) WITH PROPOFOL N/A 01/16/2023   Procedure: ESOPHAGOGASTRODUODENOSCOPY (EGD) WITH PROPOFOL;  Surgeon:  Regis Bill, MD;  Location: [facility] ENDOSCOPY;  Service: Endoscopy;  Laterality: N/A;   ESOPHAGOGASTRODUODENOSCOPY (EGD) WITH PROPOFOL N/A 06/27/2023   Procedure: ESOPHAGOGASTRODUODENOSCOPY (EGD) WITH PROPOFOL;  Surgeon: Imogene Burn, MD;  Location: [place] ENDOSCOPY;  Service: Gastroenterology;  Laterality: N/A;   GIVENS CAPSULE STUDY N/A 06/24/2023   Procedure: GIVENS CAPSULE STUDY;  Surgeon: Shellia Cleverly, DO;  Location: [facility] ENDOSCOPY;  Service: Gastroenterology;  Laterality: N/A;   GIVENS CAPSULE STUDY N/A 06/27/2023   Procedure: GIVENS CAPSULE STUDY;  Surgeon: Imogene Burn, MD;  Location: [facility] ENDOSCOPY;  Service: Gastroenterology;  Laterality: N/A;   HEMORRHOID SURGERY     HOT HEMOSTASIS N/A 06/22/2023   Procedure: HOT HEMOSTASIS (ARGON PLASMA COAGULATION/BICAP);  Surgeon: Shellia Cleverly, DO;  Location: [facility] END[facility]COPY;  Service: Gastroenterology;  Laterality: N/A;   IR ANGIOGRAM VISCERAL SELECTIVE  06/21/2023   SUBMUCOSAL TATTOO INJECTION  06/22/2023   Procedure: SUBMUCOSAL TATTOO INJECTION;  Surgeon: Shellia Cleverly, DO;  Location: [facility] ENDOSCOPY;  Service: Gastroenterology;;   TONSILLECTOMY     Social History   Socioeconomic History   Marital status: Married    Spouse name: Odette Horns (Spouse)   Number of children: Not on file   Years of education: Not on file   Highest education level: Not on file  Occupational History   Not on file  Tobacco Use   Smoking status: Never    Passive exposure: Never   Smokeless tobacco: Never  Vaping Use   Vaping status: Never Used  Substance and Sexual Activity   Alcohol use: Yes    Alcohol/week: 7.0 standard drinks of alcohol    Types: 2 Glasses of wine, 5 Shots of liquor per week   Drug use: No   Sexual activity: Not Currently    Partners: Female  Other Topics Concern   Not on file  Social History Narrative   Not on file   Social Drivers of Health   Financial Resource Strain: Low Risk  (05/09/2023)   Received from  [facility]   Overall Financial Resource Strain (CARDIA)    Difficulty of Paying Living Expenses: Not hard at all  Food Insecurity: No Food Insecurity (07/18/2023)   Hunger Vital Sign    Worried About Running Out of Food in the Last Year: Never true    Ran Out of Food in the Last Year: Never true  Transportation Needs: No Transportation Needs (06/21/2023)   PRAPARE - Administrator, Civil Service (Medical): No    Lack of Transportation (Non-Medical): No  Physical Activity: Not on file  Stress: Not on file  Social Connections: Not on file  Intimate Partner Violence: Not At Risk (07/18/2023)   Humiliation, Afraid, Rape, and Kick questionnaire    Fear of Current or Ex-Partner: No    Emotionally Abused: No    Physically Abused: No    Sexually Abused: No     Family History  Problem Relation Age of Onset   Heart disease Mother    Heart Problems Father    Current Outpatient Medications:    atorvastatin (  LIPITOR) 20 MG tablet, Take 20 mg by mouth at bedtime., Disp: , Rfl: 11   Cholecalciferol 1000 units capsule, Take 2,000 Units by mouth at bedtime., Disp: , Rfl:    folic acid (FOLVITE) 1 MG tablet, Take 2 mg by mouth in the morning., Disp: , Rfl:    gabapentin (NEURONTIN) 300 MG capsule, Take 300 mg by mouth in the morning and at bedtime., Disp: , Rfl:    metoprolol succinate (TOPROL-XL) 100 MG 24 hr tablet, Take 100 mg by mouth daily., Disp: , Rfl:    Octreotide Acetate 100 MCG/ML SOSY, Inject 1 mL (100 mcg total) into the skin every 12 (twelve) hours. Prefilled [place]- Will call pharmacy with goodrx number, Disp: 90 mL, Rfl: 1   omega-3 acid ethyl esters (LOVAZA) 1 g capsule, Take 1 g by mouth in the morning and at bedtime., Disp: , Rfl: 3   pantoprazole (PROTONIX) 40 MG injection, Inject 40 mg into the vein every 12 (twelve) hours., Disp: 1 each, Rfl: 0   polyethylene glycol (MIRALAX / GLYCOLAX) 17 g packet, Take 17 g by mouth daily., Disp: 14 each, Rfl: 0    spironolactone (ALDACTONE) 100 MG tablet, Take by mouth., Disp: , Rfl:    tamsulosin (FLOMAX) 0.4 MG CAPS capsule, Take 1 capsule (0.4 mg total) by mouth daily., Disp: 30 capsule, Rfl: 6   triamcinolone ointment (KENALOG) 0.1 %, Apply 1 Application topically 2 (two) times daily as needed (leg irritation/rash.)., Disp: , Rfl:    vitamin B-12 (CYANOCOBALAMIN) 1000 MCG tablet, Take 1,000 mcg by mouth at bedtime., Disp: , Rfl:    vitamin C (ASCORBIC ACID) 500 MG tablet, Take 500 mg by mouth at bedtime., Disp: , Rfl:    ferrous sulfate 325 (65 FE) MG tablet, Take 1 tablet (325 mg total) by mouth daily with breakfast., Disp: 90 tablet, Rfl: 0   metoprolol tartrate (LOPRESSOR) 25 MG tablet, Take 0.5 tablets (12.5 mg total) by mouth 2 (two) times daily., Disp: 90 tablet, Rfl: 0  Physical exam:  Vitals:   11/09/23 1142  BP: (!) 149/72  Pulse: 60  Resp: 16  Temp: (!) 96.6 F (35.9 C)  TempSrc: Tympanic  SpO2: 99%  Weight: 195 lb (88.5 kg)   Physical Exam Vitals reviewed.  Constitutional:      Appearance: He is not ill-appearing.     Comments: Accompanied by wife  Cardiovascular:     Rate and Rhythm: Normal rate and regular rhythm.  Pulmonary:     Effort: Pulmonary effort is normal.     Breath sounds: Normal breath sounds.  Abdominal:     General: There is no distension.     Palpations: Abdomen is soft.  Skin:    General: Skin is warm and dry.     Coloration: Skin is not pale.  Neurological:     Mental Status: He is alert and oriented to person, place, and time.    CBC    Component Value Date/Time   WBC 4.7 10/05/2023 1008   RBC 4.50 10/05/2023 1008   HGB 14.5 10/05/2023 1008   HCT 43.3 10/05/2023 1008   PLT 178.0 10/05/2023 1008   MCV 96.3 10/05/2023 1008   MCH 31.6 08/30/2023 1531   MCHC 33.5 10/05/2023 1008   RDW 16.3 (H) 10/05/2023 1008   LYMPHSABS 1.1 10/05/2023 1008   MONOABS 0.6 10/05/2023 1008   EOSABS 0.3 10/05/2023 1008   BASOSABS 0.0 10/05/2023 1008       Latest Ref Rng & Units 07/18/2023  2:51 PM 06/29/2023    5:34 AM 06/28/2023    4:17 AM  CMP  Glucose 70 - 99 mg/dL 454  098  119   BUN 8 - 23 mg/dL 14  6  6    Creatinine 0.61 - 1.24 mg/dL 1.47  8.29  5.62   Sodium 135 - 145 mmol/L 138  136  136   Potassium 3.5 - 5.1 mmol/L 4.5  3.9  4.8   Chloride 98 - 111 mmol/L 103  105  107   CO2 22 - 32 mmol/L 26  25  22    Calcium 8.9 - 10.3 mg/dL 9.2  7.4  7.3   Total Protein 6.5 - 8.1 g/dL 7.3  4.2  4.0   Total Bilirubin <1.2 mg/dL 0.7  1.3  1.8   Alkaline Phos 38 - 126 U/L 99  46  45   AST 15 - 41 U/L 18  16  30    ALT 0 - 44 U/L 13  11  8     MM 3D DIAGNOSTIC MAMMOGRAM BILATERAL BREAST Result Date: 11/06/2023 CLINICAL DATA:  78 year old gentleman with bilateral breast pain. EXAM: DIGITAL DIAGNOSTIC BILATERAL MAMMOGRAM WITH TOMOSYNTHESIS AND CAD; ULTRASOUND RIGHT BREAST LIMITED TECHNIQUE: Bilateral digital diagnostic mammography and breast tomosynthesis was performed. The images were evaluated with computer-aided detection. ; Targeted ultrasound examination of the right breast was performed COMPARISON:  Previous exam(s). ACR Breast Density Category b: There are scattered areas of fibroglandular density. FINDINGS: Right: Mammogram: Flame shaped density in the retroareolar right breast is consistent with gynecomastia. In addition, a focal asymmetry was seen in the lower breast on the MLO view. On spot compression MLO views, it appeared to be an intramammary lymph node. No suspicious mass, distortion, or microcalcifications are identified to suggest presence of malignancy. Ultrasound: Sonographic evaluation of the lower right breast confirm the presence of a nonenlarged intramammary lymph node at 5:30 3 CMFN, which corresponds to the lymph node seen on mammogram. Additional nonenlarged intramammary lymph node was seen in the lateral right breast 9:00 7 CMFN. Left: Mammogram: Flame shaped density in the retroareolar left breast is consistent with gynecomastia.  No suspicious mass, distortion, or microcalcifications are identified to suggest presence of malignancy. IMPRESSION: 1. Bilateral benign retroareolar gynecomastia. 2. No suspicious findings to indicate malignancy. RECOMMENDATION: No further mammographic follow-up is recommended unless there are persistent or intervening clinical concerns. Gynecomastia is common and can occur with changes in the testosterone:estrogen ratio. Potential causes of gynecomastia include numerous prescription medications, dietary supplements, anabolic steroids, and recreational drugs, particularly marijuana. Other causes include hormone secreting testicular or pituitary tumors. Gynecomastia can be related to other medical problems, such as kidney, thyroid, or liver disease. Gynecomastia often resolves on its own. I have discussed the findings and recommendations with the patient. If applicable, a reminder letter will be sent to the patient regarding the next appointment. BI-RADS CATEGORY  2: Benign. Electronically Signed   By: Acquanetta Belling M.D.   On: 11/06/2023 12:05   [place] LIMITED ULTRASOUND INCLUDING AXILLA RIGHT BREAST Result Date: 11/06/2023 CLINICAL DATA:  78 year old gentleman with bilateral breast pain. EXAM: DIGITAL DIAGNOSTIC BILATERAL MAMMOGRAM WITH TOMOSYNTHESIS AND CAD; ULTRASOUND RIGHT BREAST LIMITED TECHNIQUE: Bilateral digital diagnostic mammography and breast tomosynthesis was performed. The images were evaluated with computer-aided detection. ; Targeted ultrasound examination of the right breast was performed COMPARISON:  Previous exam(s). ACR Breast Density Category b: There are scattered areas of fibroglandular density. FINDINGS: Right: Mammogram: Flame shaped density in the retroareolar right breast is consistent with  gynecomastia. In addition, a focal asymmetry was seen in the lower breast on the MLO view. On spot compression MLO views, it appeared to be an intramammary lymph node. No suspicious mass, distortion, or  microcalcifications are identified to suggest presence of malignancy. Ultrasound: Sonographic evaluation of the lower right breast confirm the presence of a nonenlarged intramammary lymph node at 5:30 3 CMFN, which corresponds to the lymph node seen on mammogram. Additional nonenlarged intramammary lymph node was seen in the lateral right breast 9:00 7 CMFN. Left: Mammogram: Flame shaped density in the retroareolar left breast is consistent with gynecomastia. No suspicious mass, distortion, or microcalcifications are identified to suggest presence of malignancy. IMPRESSION: 1. Bilateral benign retroareolar gynecomastia. 2. No suspicious findings to indicate malignancy. RECOMMENDATION: No further mammographic follow-up is recommended unless there are persistent or intervening clinical concerns. Gynecomastia is common and can occur with changes in the testosterone:estrogen ratio. Potential causes of gynecomastia include numerous prescription medications, dietary supplements, anabolic steroids, and recreational drugs, particularly marijuana. Other causes include hormone secreting testicular or pituitary tumors. Gynecomastia can be related to other medical problems, such as kidney, thyroid, or liver disease. Gynecomastia often resolves on its own. I have discussed the findings and recommendations with the patient. If applicable, a reminder letter will be sent to the patient regarding the next appointment. BI-RADS CATEGORY  2: Benign. Electronically Signed   By: Acquanetta Belling M.D.   On: 11/06/2023 12:05   Assessment and plan- Patient is a 78 y.o. male referred for iron deficiency anemia  Iron Deficiency Anemia- secondary to massive GI bleed Nov 2024. Secondary to AVMs. Received 14 units pRBCs during hospitalization. Discharged with hemoglobin 7.6. Followed by Dr Barron Alvine. S/p feraheme x 2 outpatient. Cbc continues to improve, now 14.3, ferritin 202, iron sat 36%. Improved. Hold feraheme today. Plan to continue oral  iron which he is tolerating well. Plan to repeat labs in 3-4 months with follow up week later for possible feraheme. We reviewed symptoms that would be concerning and warrant sooner return to clinic.   Disposition:  3-4 months- labs Day to week later- Dr Smith Robert, +/- feraheme- la  Thank you for this kind referral and the opportunity to participate in the care of this patient  Visit Diagnosis 1. Other iron deficiency anemia    Consuello Masse, DNP, AGNP-C, [facility] [phone] (clinic) 11/09/2023

## 2023-11-09 NOTE — Telephone Encounter (Signed)
 I have left a voicemail for patient indicating that unfortunately, we have received a letter dated 10/24/23 from [facility] again indicating that they have denied our appeal for coverage of octreotide. Patient previously expressed that they were no longer going to pursue insurance coverage and would pay out of pocket if necessary for the remaining time rx is needed. Advised if they have changed their mind and would like [place] to request an external appeal review, to let us know and we will begin that process.

## 2023-11-13 ENCOUNTER — Telehealth: Payer: Self-pay | Admitting: Gastroenterology

## 2023-11-13 NOTE — Telephone Encounter (Signed)
 Left message on vm for patient's wife to return call.

## 2023-11-13 NOTE — Telephone Encounter (Signed)
 Harry Patterson returned call. She just wanted to make sure Dr. Karene Oto saw patient's labs from 11/01/23. Also wanted to follow up with Vale Garrison, RN regarding Octreotide appeal.

## 2023-11-13 NOTE — Telephone Encounter (Signed)
 Inbound call from patient's wife requesting a call to discuss recent lab work patient completed with his pcp. Please advise, thank you.

## 2023-11-14 ENCOUNTER — Ambulatory Visit: Payer: BC Managed Care – PPO | Admitting: Oncology

## 2023-11-14 ENCOUNTER — Other Ambulatory Visit: Payer: BC Managed Care – PPO

## 2023-11-14 NOTE — Telephone Encounter (Signed)
 Spoke to Harry Patterson and advised that copy of patient's labs has been forwarded electronically to Dr Karene Oto for his review. In addition, I have completed external review request through [facility] Department of Insurance. Forms will require Harry Patterson's signature. 126 pages of medical records will be sent with this external review request.   I am awaiting a copy of the internal review denial to be placed in EPIC so I can add that to the requested records.   Harry Patterson says they will be by after 11/27/23 to sign appropriate paperwork as they will be in [place]  for the next couple of weeks.

## 2023-11-22 NOTE — Telephone Encounter (Signed)
 Left message for Harry Patterson to return call to discuss lab results. Will continue efforts.

## 2023-11-23 NOTE — Telephone Encounter (Signed)
 Cirigliano, Vito V, DO  [employer] (1:04 PM)    Labs from 11/01/2023 reviewed and show normal hemoglobin of 14.3 with normal MCV/RDW at 97/13.4.  Iron panel also completely normal along with normal B12 and normal liver enzymes.  All great news here.    Patient notified that labs have been reviewed by Dr Karene Oto and are normal.  Patient thanked me for the the call.  No further questions.

## 2023-11-30 NOTE — Telephone Encounter (Signed)
 127 pages of medical records and External review request have been sent over to the [facility] Department of Insurance at fax [phone]. Phone [phone].

## 2023-12-12 NOTE — Telephone Encounter (Signed)
 Inbound call from patient's wife, states she had a letter dated 5/2, stating it was a denial. Patient would like to speak to [facility] with next steps.

## 2023-12-12 NOTE — Telephone Encounter (Signed)
 Harry Patterson states that she got a note from [employer] office indicating continued denial for an external review of octreotide  coverage because an "internal denial process" with insurance must take place first. An internal appeal was completed and denied by insurance. Advised I will attempt to contact Insurance Commissioner office within the next few days to further inquire.

## 2023-12-14 NOTE — Telephone Encounter (Signed)
 I contacted [place] [employer] office to inquire about the letter Mrs.Louro says she received regarding the office inability to review the case for appeal since internal insurance appeal was not done. I spoke to Colvin Dec, who was apparently the case reviewer. She says that the patient's next steps for what should happen are available on the denial letter and should be followed.   I contacted Mrs.Wesely to advise of this information. She looked at the letter and says there is no information for next steps other than to call the office for additional questions.  I have advised that they should reach out to Cain Castillo to inquire further if there are no additional instructions in the letter. If there is anything else I can help with at that time, they should let me know.

## 2024-02-05 ENCOUNTER — Inpatient Hospital Stay: Attending: Oncology

## 2024-02-05 DIAGNOSIS — D509 Iron deficiency anemia, unspecified: Secondary | ICD-10-CM | POA: Diagnosis present

## 2024-02-05 DIAGNOSIS — N183 Chronic kidney disease, stage 3 unspecified: Secondary | ICD-10-CM | POA: Diagnosis not present

## 2024-02-05 LAB — CBC
HCT: 43.6 % (ref 39.0–52.0)
Hemoglobin: 14.5 g/dL (ref 13.0–17.0)
MCH: 33.9 pg (ref 26.0–34.0)
MCHC: 33.3 g/dL (ref 30.0–36.0)
MCV: 101.9 fL — ABNORMAL HIGH (ref 80.0–100.0)
Platelets: 178 K/uL (ref 150–400)
RBC: 4.28 MIL/uL (ref 4.22–5.81)
RDW: 12.4 % (ref 11.5–15.5)
WBC: 4.1 K/uL (ref 4.0–10.5)
nRBC: 0 % (ref 0.0–0.2)

## 2024-02-05 LAB — IRON AND TIBC
Iron: 113 ug/dL (ref 45–182)
Saturation Ratios: 33 % (ref 17.9–39.5)
TIBC: 344 ug/dL (ref 250–450)
UIBC: 231 ug/dL

## 2024-02-05 LAB — FERRITIN: Ferritin: 163 ng/mL (ref 24–336)

## 2024-02-06 ENCOUNTER — Encounter: Payer: Self-pay | Admitting: Oncology

## 2024-02-06 ENCOUNTER — Inpatient Hospital Stay (HOSPITAL_BASED_OUTPATIENT_CLINIC_OR_DEPARTMENT_OTHER): Admitting: Oncology

## 2024-02-06 ENCOUNTER — Inpatient Hospital Stay

## 2024-02-06 VITALS — BP 144/73 | HR 62 | Temp 97.8°F | Resp 18 | Ht 68.0 in | Wt 189.8 lb

## 2024-02-06 DIAGNOSIS — N183 Chronic kidney disease, stage 3 unspecified: Secondary | ICD-10-CM | POA: Diagnosis not present

## 2024-02-06 DIAGNOSIS — D509 Iron deficiency anemia, unspecified: Secondary | ICD-10-CM

## 2024-02-06 DIAGNOSIS — D631 Anemia in chronic kidney disease: Secondary | ICD-10-CM | POA: Diagnosis not present

## 2024-02-06 NOTE — Progress Notes (Signed)
 Hematology/Oncology Consult note [facility]  [phone] [phone]  Patient Care Team: Fernande Ophelia JINNY DOUGLAS, MD as PCP - General (Internal Medicine) Melanee Annah BROCKS, MD as Consulting Physician (Oncology)   Name of the patient: Harry Patterson  [id number]  September 10, 1945   Date of visit: 02/06/24  Diagnosis-anemia of chronic kidney disease  Chief complaint/ Reason for visit-routine follow-up of anemia of chronic kidney disease  Heme/Onc history: Patient is a 78 year old male with a past medical history significant for hypertension, CAD, BPH, stage IIIb kidney disease referred for anemia CBC from 07/09/2023 showed white count of 3.4, H&H of 9.1/29.3 and a platelet count of 306. Serum creatinine was normal at 0.9. Hepatitis B and C testing negative. His hemoglobin has been around 7 since November 2024 when he was admitted to the hospital for symptoms of GI bleed. He had EGD which showed Barrett's esophagus but no source of bleeding. Colonoscopy showed hemorrhoids and diverticulosis. Capsule endoscopy negative. Since Meckel scan was negative. 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 Single lumen enteroscopy was done which showed 2 small AVMs which were cauterized as well. He received PRBC transfusion about 14 units during his hospital stay.  Patient is getting octreotide  on a monthly basis through [facility] but has to pay for it out-of-pocket  anemia workup done on 07/18/2023 with the patient. CBC shows an H&H of 10.8/35.6 which is improved as compared to a month ago. B12 levels normal at 815. Reticulocyte count appropriate for the degree of anemia. Haptoglobin normal therefore not indicative of hemolysis. TSH normal. Myeloma panel showed no M protein. Serum creatinine which was previously elevated at 1.4 has now normalized to 1.1.  He had evidence of  iron deficiency anemia and last received Feraheme in January 2025  Interval history-overall patient is doing well denies any blood loss in the stool or urine.  Denies any dark melanotic stools.  ECOG PS- 0 Pain scale- 0   Review of systems- Review of Systems  Constitutional:  Negative for chills, fever, malaise/fatigue and weight loss.  HENT:  Negative for congestion, ear discharge and nosebleeds.   Eyes:  Negative for blurred vision.  Respiratory:  Negative for cough, hemoptysis, sputum production, shortness of breath and wheezing.   Cardiovascular:  Negative for chest pain, palpitations, orthopnea and claudication.  Gastrointestinal:  Negative for abdominal pain, blood in stool, constipation, diarrhea, heartburn, melena, nausea and vomiting.  Genitourinary:  Negative for dysuria, flank pain, frequency, hematuria and urgency.  Musculoskeletal:  Negative for back pain, joint pain and myalgias.  Skin:  Negative for rash.  Neurological:  Negative for dizziness, tingling, focal weakness, seizures, weakness and headaches.  Endo/Heme/Allergies:  Does not bruise/bleed easily.  Psychiatric/Behavioral:  Negative for depression and suicidal ideas. The patient does not have insomnia.       Allergies  Allergen Reactions   Amoxicillin Other (See Comments)    Oral thrush   Other Swelling    Pneumonia vaccine-swollen/painful/fever chills   Amlodipine Other (See Comments)    EDEMA     Past Medical History:  Diagnosis Date   Allergic rhinitis    Anemia    Arthritis    Avascular necrosis of bones of both hips (HCC)    B12 deficiency    Barrett's esophagus without dysplasia    Bilateral inguinal hernia    Bilateral renal cysts    CAD (coronary  artery disease)    Chronic prostatitis    DDD (degenerative disc disease), lumbar    Enlarged prostate    Erectile dysfunction    a.) on PDE5i (sildenafil)   GERD (gastroesophageal reflux disease)    History of bilateral cataract extraction  2020   HLD (hyperlipidemia)    HTN (hypertension)    Hypertension    LBBB (left bundle branch block)    Leukopenia    Long term current use of aspirin    OSA on CPAP    Peripheral neuropathy    Prediabetes    Skin cancer    Substance abuse (HCC)    Vitamin D  deficiency      Past Surgical History:  Procedure Laterality Date   APPENDECTOMY     BALLOON ENTEROSCOPY N/A 06/22/2023   Procedure: BALLOON ENTEROSCOPY;  Surgeon: San Sandor GAILS, DO;  Location: [facility] END[facility];  Service: Gastroenterology;  Laterality: N/A;   BIOPSY  01/16/2023   Procedure: BIOPSY;  Surgeon: Maryruth Ole DASEN, MD;  Location: [facility] ENDOSCOPY;  Service: Endoscopy;;   CATARACT EXTRACTION W/PHACO Right 06/11/2019   Procedure: CATARACT EXTRACTION PHACO AND INTRAOCULAR LENS PLACEMENT (IOC) RIGHT 00:54.6     22.0%    12.00;  Surgeon: Mittie Gaskin, MD;  Location: [facility];  Service: Ophthalmology;  Laterality: Right;  sleep apnea   CATARACT EXTRACTION W/PHACO Left 06/30/2019   Procedure: CATARACT EXTRACTION PHACO AND INTRAOCULAR LENS PLACEMENT (IOC) LEFT TORIC LENS 9.97  01:09.1  14.4%;  Surgeon: Mittie Gaskin, MD;  Location: [facility];  Service: Ophthalmology;  Laterality: Left;   COLONOSCOPY N/A 06/18/2023   Procedure: COLONOSCOPY;  Surgeon: Toledo, Ladell POUR, MD;  Location: [facility] ENDOSCOPY;  Service: Gastroenterology;  Laterality: N/A;   COLONOSCOPY W/ POLYPECTOMY     02/08/00,11'12'04,05/05/05, 08/21/2008, 12/05/2013, 05/08/2014   COLONOSCOPY WITH PROPOFOL  N/A 06/06/2017   Procedure: COLONOSCOPY WITH PROPOFOL ;  Surgeon: Viktoria Lamar DASEN, MD;  Location: [facility] ENDOSCOPY;  Service: Endoscopy;  Laterality: N/A;   COLONOSCOPY WITH PROPOFOL  N/A 01/16/2023   Procedure: COLONOSCOPY WITH PROPOFOL ;  Surgeon: Maryruth Ole DASEN, MD;  Location: [facility] ENDOSCOPY;  Service: Endoscopy;  Laterality: N/A;   cyst removed  Right    thumb   CYSTOSCOPY WITH URETHRAL DILATATION N/A 06/05/2023    Procedure: CYSTOSCOPY WITH URETHRAL BALLOON DILATATION USING OPTILUME;  Surgeon: Twylla Glendia BROCKS, MD;  Location: [facility] ORS;  Service: Urology;  Laterality: N/A;   ESOPHAGOGASTRODUODENOSCOPY     x7   ESOPHAGOGASTRODUODENOSCOPY N/A 06/17/2023   Procedure: ESOPHAGOGASTRODUODENOSCOPY (EGD);  Surgeon: Toledo, Ladell POUR, MD;  Location: [facility] ENDOSCOPY;  Service: Gastroenterology;  Laterality: N/A;   ESOPHAGOGASTRODUODENOSCOPY (EGD) WITH PROPOFOL  N/A 06/06/2017   Procedure: ESOPHAGOGASTRODUODENOSCOPY (EGD) WITH PROPOFOL ;  Surgeon: Viktoria Lamar DASEN, MD;  Location: [facility] ENDOSCOPY;  Service: Endoscopy;  Laterality: N/A;   ESOPHAGOGASTRODUODENOSCOPY (EGD) WITH PROPOFOL  N/A 11/27/2019   Procedure: ESOPHAGOGASTRODUODENOSCOPY (EGD) WITH PROPOFOL ;  Surgeon: Toledo, Ladell POUR, MD;  Location: [facility] ENDOSCOPY;  Service: Gastroenterology;  Laterality: N/A;   ESOPHAGOGASTRODUODENOSCOPY (EGD) WITH PROPOFOL  N/A 01/16/2023   Procedure: ESOPHAGOGASTRODUODENOSCOPY (EGD) WITH PROPOFOL ;  Surgeon: Maryruth Ole DASEN, MD;  Location: [facility] ENDOSCOPY;  Service: Endoscopy;  Laterality: N/A;   ESOPHAGOGASTRODUODENOSCOPY (EGD) WITH PROPOFOL  N/A 06/27/2023   Procedure: ESOPHAGOGASTRODUODENOSCOPY (EGD) WITH PROPOFOL ;  Surgeon: Federico Rosario BROCKS, MD;  Location: [facility] ENDOSCOPY;  Service: Gastroenterology;  Laterality: N/A;   GIVENS CAPSULE STUDY N/A 06/24/2023   Procedure: GIVENS CAPSULE STUDY;  Surgeon: San Sandor GAILS, DO;  Location: [facility] ENDOSCOPY;  Service: Gastroenterology;  Laterality: N/A;  GIVENS CAPSULE STUDY N/A 06/27/2023   Procedure: GIVENS CAPSULE STUDY;  Surgeon: Federico Rosario BROCKS, MD;  Location: [facility] ENDOSCOPY;  Service: Gastroenterology;  Laterality: N/A;   HEMORRHOID SURGERY     HOT HEMOSTASIS N/A 06/22/2023   Procedure: HOT HEMOSTASIS (ARGON PLASMA COAGULATION/BICAP);  Surgeon: San Sandor GAILS, DO;  Location: [facility] ENDOSCOPY;  Service: Gastroenterology;  Laterality: N/A;   IR ANGIOGRAM VISCERAL SELECTIVE  06/21/2023    SUBMUCOSAL TATTOO INJECTION  06/22/2023   Procedure: SUBMUCOSAL TATTOO INJECTION;  Surgeon: San Sandor GAILS, DO;  Location: [facility] ENDOSCOPY;  Service: Gastroenterology;;   TONSILLECTOMY      Social History   Socioeconomic History   Marital status: Married    Spouse name: MYRIAM SLATER BROCKS (Spouse)   Number of children: Not on file   Years of education: Not on file   Highest education level: Not on file  Occupational History   Not on file  Tobacco Use   Smoking status: Never    Passive exposure: Never   Smokeless tobacco: Never  Vaping Use   Vaping status: Never Used  Substance and Sexual Activity   Alcohol use: Yes    Alcohol/week: 7.0 standard drinks of alcohol    Types: 2 Glasses of wine, 5 Shots of liquor per week   Drug use: No   Sexual activity: Not Currently    Partners: Female  Other Topics Concern   Not on file  Social History Narrative   Not on file   Social Drivers of Health   Financial Resource Strain: Low Risk  (05/09/2023)   Received from [facility]   Overall Financial Resource Strain (CARDIA)    Difficulty of Paying Living Expenses: Not hard at all  Food Insecurity: No Food Insecurity (07/18/2023)   Hunger Vital Sign    Worried About Running Out of Food in the Last Year: Never true    Ran Out of Food in the Last Year: Never true  Transportation Needs: No Transportation Needs (06/21/2023)   PRAPARE - Administrator, Civil Service (Medical): No    Lack of Transportation (Non-Medical): No  Physical Activity: Not on file  Stress: Not on file  Social Connections: Not on file  Intimate Partner Violence: Not At Risk (07/18/2023)   Humiliation, Afraid, Rape, and Kick questionnaire    Fear of Current or Ex-Partner: No    Emotionally Abused: No    Physically Abused: No    Sexually Abused: No    Family History  Problem Relation Age of Onset   Heart disease Mother    Heart Problems Father      Current Outpatient Medications:     atorvastatin  (LIPITOR) 20 MG tablet, Take 20 mg by mouth at bedtime., Disp: , Rfl: 11   Cholecalciferol  1000 units capsule, Take 2,000 Units by mouth at bedtime., Disp: , Rfl:    ferrous sulfate  325 (65 FE) MG tablet, Take 1 tablet (325 mg total) by mouth daily with breakfast., Disp: 90 tablet, Rfl: 0   folic acid  (FOLVITE ) 1 MG tablet, Take 2 mg by mouth in the morning., Disp: , Rfl:    gabapentin  (NEURONTIN ) 300 MG capsule, Take 300 mg by mouth in the morning and at bedtime., Disp: , Rfl:    metoprolol  succinate (TOPROL -XL) 100 MG 24 hr tablet, Take 100 mg by mouth daily., Disp: , Rfl:    metoprolol  tartrate (LOPRESSOR ) 25 MG tablet, Take 0.5 tablets (12.5 mg total) by mouth 2 (two) times daily., Disp: 90 tablet, Rfl:  0   Octreotide  Acetate 100 MCG/ML SOSY, INJECT 1 ML (100 MCG TOTAL) INTO THE SKIN EVERY 12 HOURS, Disp: 60 mL, Rfl: 3   omega-3 acid ethyl esters (LOVAZA ) 1 g capsule, Take 1 g by mouth in the morning and at bedtime., Disp: , Rfl: 3   pantoprazole  (PROTONIX ) 40 MG injection, Inject 40 mg into the vein every 12 (twelve) hours., Disp: 1 each, Rfl: 0   polyethylene glycol (MIRALAX  / GLYCOLAX ) 17 g packet, Take 17 g by mouth daily., Disp: 14 each, Rfl: 0   spironolactone (ALDACTONE) 100 MG tablet, Take by mouth., Disp: , Rfl:    tamsulosin  (FLOMAX ) 0.4 MG CAPS capsule, Take 1 capsule (0.4 mg total) by mouth daily., Disp: 30 capsule, Rfl: 6   triamcinolone ointment (KENALOG) 0.1 %, Apply 1 Application topically 2 (two) times daily as needed (leg irritation/rash.)., Disp: , Rfl:    vitamin B-12 (CYANOCOBALAMIN ) 1000 MCG tablet, Take 1,000 mcg by mouth at bedtime., Disp: , Rfl:    vitamin C  (ASCORBIC ACID ) 500 MG tablet, Take 500 mg by mouth at bedtime., Disp: , Rfl:   Physical exam:  Vitals:   02/06/24 1115  BP: (!) 144/73  Pulse: 62  Resp: 18  Temp: 97.8 F (36.6 C)  TempSrc: Tympanic  SpO2: 96%  Weight: 189 lb 12.8 oz (86.1 kg)  Height: 5' 8 (1.727 m)   Physical  Exam Cardiovascular:     Rate and Rhythm: Normal rate and regular rhythm.     Heart sounds: Normal heart sounds.  Pulmonary:     Effort: Pulmonary effort is normal.     Breath sounds: Normal breath sounds.  Skin:    General: Skin is warm and dry.  Neurological:     Mental Status: He is alert and oriented to person, place, and time.      I have personally reviewed labs listed below:    Latest Ref Rng & Units 07/18/2023    2:51 PM  CMP  Glucose 70 - 99 mg/dL 874   BUN 8 - 23 mg/dL 14   Creatinine 9.38 - 1.24 mg/dL 8.86   Sodium 864 - 854 mmol/L 138   Potassium 3.5 - 5.1 mmol/L 4.5   Chloride 98 - 111 mmol/L 103   CO2 22 - 32 mmol/L 26   Calcium  8.9 - 10.3 mg/dL 9.2   Total Protein 6.5 - 8.1 g/dL 7.3   Total Bilirubin <8.7 mg/dL 0.7   Alkaline Phos 38 - 126 U/L 99   AST 15 - 41 U/L 18   ALT 0 - 44 U/L 13       Latest Ref Rng & Units 02/05/2024   10:27 AM  CBC  WBC 4.0 - 10.5 K/uL 4.1   Hemoglobin 13.0 - 17.0 g/dL 85.4   Hematocrit 60.9 - 52.0 % 43.6   Platelets 150 - 400 K/uL 178      Assessment and plan- Patient is a 78 y.o. male here for routine follow-up of anemia of chronic kidney disease  Patient's H&H is presently normal at 14.5/43.6 much improved as compared to 6 months ago when he was 10.8.  White count and platelets are normal.  Ferritin levels are normal at 163 with an iron saturation of 33%.  He does not require any IV iron at this time.  He has an element of macrocytosis but B12 and thyroid  levels in April 2025 were normal.  Given stability of his hemoglobin I will repeat CBC ferritin and iron studies in  4 and 8 months and I will see him back in 8 months.  I will check B12 and folate and TSH in 4 months  Patient will discuss with GI further if he could potentially get a break from octreotide  or stagger the doses out and see if his hemoglobin remained stable despite that   Visit Diagnosis 1. Iron deficiency anemia, unspecified iron deficiency anemia type   2.  Anemia of chronic kidney failure, stage 3 (moderate) (HCC)      Dr. Annah Skene, MD, MPH [facility] [phone] 02/06/2024 8:18 AM

## 2024-02-06 NOTE — Addendum Note (Signed)
 Addended by: JASMINE DELON POUR on: 02/06/2024 11:51 AM   Modules accepted: Orders

## 2024-02-25 ENCOUNTER — Ambulatory Visit: Admitting: Gastroenterology

## 2024-02-26 ENCOUNTER — Other Ambulatory Visit: Payer: Self-pay | Admitting: Gastroenterology

## 2024-03-07 ENCOUNTER — Encounter: Payer: Self-pay | Admitting: Gastroenterology

## 2024-03-07 ENCOUNTER — Ambulatory Visit: Admitting: Gastroenterology

## 2024-03-07 VITALS — BP 138/76 | HR 57 | Ht 68.0 in | Wt 186.0 lb

## 2024-03-07 DIAGNOSIS — K31819 Angiodysplasia of stomach and duodenum without bleeding: Secondary | ICD-10-CM | POA: Diagnosis not present

## 2024-03-07 DIAGNOSIS — K5521 Angiodysplasia of colon with hemorrhage: Secondary | ICD-10-CM

## 2024-03-07 NOTE — Progress Notes (Signed)
 Chief Complaint:    Small bowel AVMs  GI History: 78 year old male with a past medical history significant for hypertension, hyperlipidemia, CAD, BPH, stage IIIb kidney disease, B12 deficiency, leukopenia, bilateral hip avascular necrosis/osteoarthritis, aortic atherosclerosis, prediabetes, chronic prostatitis, peripheral neuropathy, Barrett's Esophagus.   Hospital admission in 06/2023 with massive GI bleed.  Was initially admitted to [facility] for active GI bleed and acute blood loss anemia.  Underwent extensive evaluation to include EGD (5 cm Barrett's Esophagus), colonoscopy (internal hemorrhoids, nonbleeding diverticulosis), video capsule endoscopy (negative).  Initial CTA on 06/19/23 was negative.  Repeat CTA 06/20/23 showed active small bowel bleed in the upper abdomen, but subsequent IR evaluation but no obvious extravasation or source for embolization.  Was transferred to [facility] for suspected recurrent, brisk small bowel bleeding.  Single balloon enteroscopy with 2 small, nonbleeding duodenal AVMs treated with APC.  Was started on octreotide .  Meckel's scan was normal/negative.  Repeat video capsule endoscopy did not advance beyond the stomach.  Third video capsule endoscopy via endoscopic placement revealed small bowel AVM without active bleeding.  Received 14 units PRBCs during hospital admission with discharge hemoglobin 7.6.  (See GI note from 08/08/2023 for full details of inpatient endoscopic workup).  HPI:     Patient is a 78 y.o. male presenting to the Gastroenterology Clinic for follow-up.  He was last seen by me in the office on 08/08/2023.  At that time, no further GI bleeding and H/H uptrending with outpatient IV iron infusions.  Was taking octreotide  100 mcg twice daily.  Insurance denied octreotide  LAR.  Since then, H/H has normalized with most recent lab last month showing H/H 14.5/43.6, MCV 102, RDW 12.4, with normal iron indices.  He was seen in the Hematology Clinic on  02/06/2024.  No further IV iron needed.  Does have macrocytosis, but normal B12 and thyroid  levels.  Plan for continued monitoring of CBC and iron studies with follow-up in early 2026.  Today, he states he feels well. No bleeding. Still taking PO iron.   Traveling to [place] this fall and wants Dr note to facilitate travel with his octreotide  and needles.    Intentionally lost 20# with dieting.   Review of systems:     No chest pain, no SOB, no fevers, no urinary sx   Past Medical History:  Diagnosis Date   Allergic rhinitis    Anemia    Arthritis    Avascular necrosis of bones of both hips (HCC)    B12 deficiency    Barrett's esophagus without dysplasia    Bilateral inguinal hernia    Bilateral renal cysts    CAD (coronary artery disease)    Chronic prostatitis    DDD (degenerative disc disease), lumbar    Enlarged prostate    Erectile dysfunction    a.) on PDE5i (sildenafil)   GERD (gastroesophageal reflux disease)    History of bilateral cataract extraction 2020   HLD (hyperlipidemia)    HTN (hypertension)    Hypertension    LBBB (left bundle branch block)    Leukopenia    Long term current use of aspirin    OSA on CPAP    Peripheral neuropathy    Prediabetes    Skin cancer    Substance abuse (HCC)    Vitamin D  deficiency     Patient's surgical history, family medical history, social history, medications and allergies were all reviewed in Epic    Current Outpatient Medications  Medication Sig Dispense Refill  atorvastatin  (LIPITOR) 20 MG tablet Take 20 mg by mouth at bedtime.  11   Cholecalciferol  1000 units capsule Take 2,000 Units by mouth at bedtime.     ferrous sulfate  325 (65 FE) MG tablet Take 1 tablet (325 mg total) by mouth daily with breakfast. 90 tablet 0   folic acid  (FOLVITE ) 1 MG tablet Take 2 mg by mouth in the morning.     gabapentin  (NEURONTIN ) 300 MG capsule Take 300 mg by mouth in the morning and at bedtime.     metoprolol  succinate (TOPROL -XL)  100 MG 24 hr tablet Take 100 mg by mouth daily.     metoprolol  tartrate (LOPRESSOR ) 25 MG tablet Take 0.5 tablets (12.5 mg total) by mouth 2 (two) times daily. 90 tablet 0   Octreotide  Acetate 100 MCG/ML SOSY INJECT 1 ML (100 MCG TOTAL) INTO THE SKIN EVERY 12 HOURS 60 mL 3   omega-3 acid ethyl esters (LOVAZA ) 1 g capsule Take 1 g by mouth in the morning and at bedtime.  3   pantoprazole  (PROTONIX ) 40 MG injection Inject 40 mg into the vein every 12 (twelve) hours. 1 each 0   polyethylene glycol (MIRALAX  / GLYCOLAX ) 17 g packet Take 17 g by mouth daily. 14 each 0   tamsulosin  (FLOMAX ) 0.4 MG CAPS capsule Take 1 capsule (0.4 mg total) by mouth daily. 30 capsule 6   triamcinolone ointment (KENALOG) 0.1 % Apply 1 Application topically 2 (two) times daily as needed (leg irritation/rash.).     vitamin B-12 (CYANOCOBALAMIN ) 1000 MCG tablet Take 1,000 mcg by mouth at bedtime.     vitamin C  (ASCORBIC ACID ) 500 MG tablet Take 500 mg by mouth at bedtime.     spironolactone (ALDACTONE) 100 MG tablet Take by mouth. (Patient not taking: Reported on 03/07/2024)     No current facility-administered medications for this visit.    Physical Exam:     BP 138/76   Pulse (!) 57   Ht 5' 8 (1.727 m)   Wt 186 lb (84.4 kg)   BMI 28.28 kg/m   GENERAL:  Pleasant male in NAD PSYCH: : Cooperative, normal affect CARDIAC:  RRR, no murmur heard, no peripheral edema PULM: Normal respiratory effort, lungs CTA bilaterally, no wheezing ABDOMEN:  Nondistended, soft, nontender. No obvious masses, no hepatomegaly,  normal bowel sounds SKIN:  turgor, no lesions seen Musculoskeletal:  Normal muscle tone, normal strength NEURO: Alert and oriented x 3, no focal neurologic deficits   IMPRESSION and PLAN:    1) Small bowel AVM 2) Iron deficiency anemia-resolved 78 year old male with recent hospitalization for significant small bowel bleed. He underwent extensive evaluation to include multiple CT angiography's, video capsule  endoscopy x 2, EGD, colonoscopy, single balloon enteroscopy, tagged RBC scan, and >14 units RBCs. Since discharge, has been maintained on octreotide  bid, and no recurrence of bleeding.  IDA has since resolved with normalization of hemoglobin and iron indices. - Continue octreotide  - Provided patient with letter to try to help his upcoming travel.  Should be able to travel with octreotide  (needs to be Cold) and needles for medication administration - Continue follow-up in the Hematology Clinic as scheduled for continued monitoring of CBC and iron indices - To contact me immediately if rebleeding.  If rebleed, to go to the ER with stat CT angiography to try to localize.  If angiography unrevealing, may need small bowel enteroscopy vs repeat urgent VCE - Avoiding NSAIDs.  Tylenol  okay for arthritis   RTC in 12 months or  sooner as needed  I spent 25 minutes of time, including independent review of results as outlined above, communicating results with the patient directly, face-to-face time with the patient, coordinating care, ordering studies and medications as appropriate, and documentation.            Sandor GAILS Ezequiel Macauley ,DO, FACG 03/07/2024, 1:51 PM

## 2024-03-07 NOTE — Patient Instructions (Addendum)
 _______________________________________________________  If your blood pressure at your visit was 140/90 or greater, please contact your primary care physician to follow up on this.  _______________________________________________________  If you are age 77 or older, your body mass index should be between 23-30. Your Body mass index is 28.28 kg/m. If this is out of the aforementioned range listed, please consider follow up with your Primary Care Provider.  If you are age 15 or younger, your body mass index should be between 19-25. Your Body mass index is 28.28 kg/m. If this is out of the aformentioned range listed, please consider follow up with your Primary Care Provider.  ________________________________________________________  The [facility] providers would like to encourage you to use MYCHART to communicate with providers for non-urgent requests or questions.  Due to long hold times on the telephone, sending your provider a message by [facility] may be a faster and more efficient way to get a response.  Please allow 48 business hours for a response.  Please remember that this is for non-urgent requests.  _______________________________________________________  [facility] is using a team-based approach to care.  Your team is made up of your doctor and two to three APPS. Our APPS (Nurse Practitioners and Physician Assistants) work with your physician to ensure care continuity for you. They are fully qualified to address your health concerns and develop a treatment plan. They communicate directly with your gastroenterologist to care for you. Seeing the Advanced Practice Practitioners on your physician's team can help you by facilitating care more promptly, often allowing for earlier appointments, access to diagnostic testing, procedures, and other specialty referrals.   It was a pleasure to see you today!  Vito Cirigliano, D.O.

## 2024-04-22 ENCOUNTER — Other Ambulatory Visit (HOSPITAL_COMMUNITY): Payer: Self-pay

## 2024-04-22 ENCOUNTER — Telehealth: Payer: Self-pay

## 2024-04-22 NOTE — Telephone Encounter (Signed)
 Pharmacy Patient Advocate Encounter   Received notification from CoverMyMeds that prior authorization for Octreotide  Acetate 100MCG/ML syringes is required/requested.   Insurance verification completed.   The patient is insured through [facility] .   Per test claim: PA required; PA started via CoverMyMeds. KEY [id number] . Waiting for clinical questions to populate.

## 2024-04-23 NOTE — Telephone Encounter (Signed)
 Looks like the previous prior authorization submission in November 2024 was denied. Peer to peer was attempted but not successful so patient was instructed to used GoodRx card to fill scripts.

## 2024-04-23 NOTE — Telephone Encounter (Signed)
 Pharmacy Patient Advocate Encounter

## 2024-04-23 NOTE — Telephone Encounter (Signed)
 Lm on vm for patient to give me a call back. Advised that insurance denied Octreotide  and I just need to clarify if he will continue filling this prescription with GoodRX coupon.

## 2024-05-08 ENCOUNTER — Telehealth: Payer: Self-pay | Admitting: Gastroenterology

## 2024-05-08 NOTE — Telephone Encounter (Signed)
 Wife made aware of MD recommendations & verbalized all understanding.

## 2024-05-08 NOTE — Telephone Encounter (Signed)
 Spoke with patient's wife & she says her husband developed a 103.9 fever, nausea, and chills back in July that resolved. The same symptoms started again this past weekend & were seen in the ED and were told it was likely viral. After reviewing his medication side effects at home, patient & wife are concerned this could be related to the octreotide  that he is on BID. Pt has been taking this medication since 06/2023 with no other issues. He did recently travel to [place], but no other sick close contacts. Wife just wanted to be on the safe side & make Dr. San aware in case this could be a reaction.

## 2024-05-08 NOTE — Telephone Encounter (Signed)
 PT wife is calling because she think her husband is having a reaction to Octreotide . Please advise

## 2024-05-30 ENCOUNTER — Encounter: Payer: Self-pay | Admitting: Oncology

## 2024-06-11 ENCOUNTER — Other Ambulatory Visit: Payer: Self-pay

## 2024-06-11 ENCOUNTER — Inpatient Hospital Stay: Attending: Oncology

## 2024-06-11 DIAGNOSIS — D509 Iron deficiency anemia, unspecified: Secondary | ICD-10-CM

## 2024-06-11 DIAGNOSIS — D631 Anemia in chronic kidney disease: Secondary | ICD-10-CM

## 2024-06-11 LAB — IRON AND TIBC
Iron: 113 ug/dL (ref 45–182)
Saturation Ratios: 34 % (ref 17.9–39.5)
TIBC: 332 ug/dL (ref 250–450)
UIBC: 219 ug/dL

## 2024-06-11 LAB — FOLATE: Folate: >20 ng/mL (ref >5.9)

## 2024-06-11 LAB — CBC WITH DIFFERENTIAL (CANCER CENTER ONLY)
Abs Immature Granulocytes: 0.01 K/uL (ref 0.00–0.07)
Basophils Absolute: 0.1 K/uL (ref 0.0–0.1)
Basophils Relative: 2 %
Eosinophils Absolute: 0.2 K/uL (ref 0.0–0.5)
Eosinophils Relative: 5 %
HCT: 41.1 % (ref 39.0–52.0)
Hemoglobin: 13.8 g/dL (ref 13.0–17.0)
Immature Granulocytes: 0 %
Lymphocytes Relative: 28 %
Lymphs Abs: 1.1 K/uL (ref 0.7–4.0)
MCH: 33.7 pg (ref 26.0–34.0)
MCHC: 33.6 g/dL (ref 30.0–36.0)
MCV: 100.2 fL — ABNORMAL HIGH (ref 80.0–100.0)
Monocytes Absolute: 0.4 K/uL (ref 0.1–1.0)
Monocytes Relative: 11 %
Neutro Abs: 2.1 K/uL (ref 1.7–7.7)
Neutrophils Relative %: 54 %
Platelet Count: 171 K/uL (ref 150–400)
RBC: 4.1 MIL/uL — ABNORMAL LOW (ref 4.22–5.81)
RDW: 12.9 % (ref 11.5–15.5)
WBC Count: 3.9 K/uL — ABNORMAL LOW (ref 4.0–10.5)
nRBC: 0 % (ref 0.0–0.2)

## 2024-06-11 LAB — TSH: TSH: 0.659 u[IU]/mL (ref 0.350–4.500)

## 2024-06-11 LAB — FERRITIN: Ferritin: 313 ng/mL (ref 24–336)

## 2024-06-11 LAB — VITAMIN B12: Vitamin B-12: 1146 pg/mL — ABNORMAL HIGH (ref 180–914)

## 2024-06-16 ENCOUNTER — Telehealth: Payer: Self-pay

## 2024-06-16 ENCOUNTER — Telehealth: Payer: Self-pay | Admitting: Gastroenterology

## 2024-06-16 NOTE — Telephone Encounter (Signed)
 Voicemail received from spouse Slater today 06/16/24 at 1:56pm asking results of most recent labs drawn last week.  Best call back number [phone].  Reviewed labs, B12 high; next scheduled follow up 10/14/24.  Forwarding to provider for review.

## 2024-06-16 NOTE — Telephone Encounter (Signed)
 Called and spoke with patient's wife regarding recent UA and lab results. Harry Patterson is highly concerned about Urine Albumin/Creatinine Ratio level >1900, it was about 500 6 weeks ago. Harry Patterson is wanting to know if MD feels like stress of previous GI bleed and continued use of Octreotide  could be effecting the patient's kidneys. Harry Patterson mentioned that patient is having some discomfort over (R) kidney. They are working to get patient scheduled to see a nephrologist but wanted insight from GI standpoint. 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 Please advise, thanks.

## 2024-06-16 NOTE — Telephone Encounter (Signed)
 He has labs are overall unremarkable.  Iron studies are normal and high B12 is not a cause for concern at this time

## 2024-06-16 NOTE — Telephone Encounter (Signed)
 Patient wife called stating that patient had abnormal blood work results come back that was completed on 11/12. Wife also stated he had urinary sample completed on 11/14 that show high proteins. States she has all of the result levels that she can provide. Requesting a call back to discuss further. Please advise, thank you

## 2024-06-17 ENCOUNTER — Encounter: Payer: Self-pay | Admitting: Oncology

## 2024-06-17 NOTE — Telephone Encounter (Signed)
 Lm on vm for patient's wife Slater to return call.

## 2024-06-17 NOTE — Telephone Encounter (Signed)
 Per Dr. Melanee He has labs are overall unremarkable.  Iron studies are normal and high B12 is not a cause for concern at this time.  Outbound call; spoke to [place] Grant informed of above. Slater also indicated labs done at [facility], urine albumin / creatinine ratio was 1961.9.  They are currently waiting on a referral.  Spouse Slater wanted to know if they need to bump down his folic acid  from 2mg  to 1mg .  Informed if any changes need to be made I will communicate it via my chart; spouse verbalized understanding. Forwarding to provider for review.

## 2024-06-17 NOTE — Telephone Encounter (Signed)
 Okay to reduce folic acid  to 1 mg daily

## 2024-06-17 NOTE — Telephone Encounter (Signed)
 PT wife Slater, is returning call. Please advise.

## 2024-06-18 NOTE — Telephone Encounter (Signed)
 Called and spoke with patient's wife Harry Patterson. Informed her of the information below. Harry Patterson states that they are currently waiting on a nephrology referral from PCP. Harry Patterson will inform the nephrologist of this information so that they can hopefully work toward reversing the kidney injury. Harry Patterson was thankful for the information and had no concerns at the end of the call.

## 2024-06-27 ENCOUNTER — Other Ambulatory Visit: Payer: Self-pay | Admitting: Gastroenterology

## 2024-07-04 ENCOUNTER — Ambulatory Visit: Payer: Self-pay | Admitting: Urology

## 2024-07-04 ENCOUNTER — Encounter: Payer: Self-pay | Admitting: Urology

## 2024-07-04 VITALS — BP 187/92 | HR 56 | Ht 68.0 in | Wt 182.0 lb

## 2024-07-04 DIAGNOSIS — Z87442 Personal history of urinary calculi: Secondary | ICD-10-CM | POA: Diagnosis not present

## 2024-07-04 DIAGNOSIS — R10A1 Flank pain, right side: Secondary | ICD-10-CM

## 2024-07-04 DIAGNOSIS — N35919 Unspecified urethral stricture, male, unspecified site: Secondary | ICD-10-CM | POA: Diagnosis not present

## 2024-07-04 LAB — BLADDER SCAN AMB NON-IMAGING

## 2024-07-04 NOTE — Progress Notes (Unsigned)
 07/04/2024 11:31 AM   Gilmore MARLA Myriam Mickey. [birth year] [id number]  Referring provider: Fernande Ophelia JINNY DOUGLAS, MD [address] [facility],  [place] [address]  Chief Complaint  Patient presents with   Other    Urologic history: 1.  Chronic nonbacterial prostatitis Tamsulosin  prn symptom flare   2.  BPH No bothersome LUTS  3.  Bulbar urethral stricture Balloon dilation (Optilume) 06/05/2023   HPI: Kadyn Guild. is a 78 y.o. male presents for annual follow-up  Since last visit as noted some slowing of his urinary stream though does feel he is emptying his bladder States he has been referred to nephrology for evaluation of proteinuria.  Creatinine has been normal Note mild right flank discomfort the past several weeks and does have a prior history of stone disease.  Recent urinalysis negative for microhematuria   PMH: Past Medical History:  Diagnosis Date   Allergic rhinitis    Anemia    Arthritis    Avascular necrosis of bones of both hips (HCC)    B12 deficiency    Barrett's esophagus without dysplasia    Bilateral inguinal hernia    Bilateral renal cysts    CAD (coronary artery disease)    Chronic prostatitis    DDD (degenerative disc disease), lumbar    Enlarged prostate    Erectile dysfunction    a.) on PDE5i (sildenafil)   GERD (gastroesophageal reflux disease)    History of bilateral cataract extraction 2020   HLD (hyperlipidemia)    HTN (hypertension)    Hypertension    LBBB (left bundle branch block)    Leukopenia    Long term current use of aspirin    OSA on CPAP    Peripheral neuropathy    Prediabetes    Skin cancer    Substance abuse (HCC)    Vitamin D  deficiency     Surgical History: Past Surgical History:  Procedure Laterality Date   APPENDECTOMY     BALLOON ENTEROSCOPY N/A 06/22/2023   Procedure: BALLOON ENTEROSCOPY;  Surgeon: San Sandor GAILS, DO;  Location: [facility] ENDOSCOPY;  Service: Gastroenterology;   Laterality: N/A;   BIOPSY  01/16/2023   Procedure: BIOPSY;  Surgeon: Maryruth Ole DASEN, MD;  Location[facility] END[facility]COPY;  Service: Endoscopy;;   CATARACT EXTRACTION W/PHACO Right 06/11/2019   Procedure: CATARACT EXTRACTION PHACO AND INTRAOCULAR LENS PLACEMENT (IOC) RIGHT 00:54.6     22.0%    12.00;  Surgeon: Mittie Gaskin, MD;  Location: [facility]GERY [facility];  Service: Ophthalmology;  Laterality: Right;  sleep apnea   CATARACT EXTRACTION W/PHACO Left 06/30/2019   Procedure: CATARACT EXTRACTION PHACO AND INTRAOCULAR LENS PLACEMENT (IOC) LEFT TORIC LENS 9.97  01:09.1  14.4%;  Surgeon: Mittie Gaskin, MD;  Location: [facility]ERY [facility];  Service: Ophthalmology;  Laterality: Left;   COLONOSCOPY N/A 06/18/2023   Procedure: COLONOSCOPY;  Surgeon: Toledo, Ladell MARLA, MD;  Location: [facility] ENDOSCOPY;  Service: Gastroenterology;  Laterality: N/A;   COLONOSCOPY W/ POLYPECTOMY     02/08/00,11'12'04,05/05/05, 08/21/2008, 12/05/2013, 05/08/2014   COLONOSCOPY WITH PROPOFOL  N/A 06/06/2017   Procedure: COLONOSCOPY WITH PROPOFOL ;  Surgeon: Viktoria Lamar DASEN, MD;  Location: [facility] ENDOSCOPY;  Service: Endoscopy;  Laterality: N/A;   COLONOSCOPY WITH PROPOFOL  N/A 01/16/2023   Procedure: COLONOSCOPY WITH PROPOFOL ;  Surgeon: Maryruth Ole DASEN, MD;  Location: [facility] ENDOSCOPY;  Service: Endoscopy;  Laterality: N/A;   cyst removed  Right    thumb   CYSTOSCOPY WITH URETHRAL DILATATION N/A 06/05/2023   Procedure: CYSTOSCOPY WITH  URETHRAL BALLOON DILATATION USING OPTILUME;  Surgeon: Twylla Glendia BROCKS, MD;  Location: [facility] ORS;  Service: Urology;  Laterality: N/A;   ESOPHAGOGASTRODUODENOSCOPY     x7   ESOPHAGOGASTRODUODENOSCOPY N/A 06/17/2023   Procedure: ESOPHAGOGASTRODUODENOSCOPY (EGD);  Surgeon: Toledo, Ladell POUR, MD;  Location: [facility] ENDOSCOPY;  Service: Gastroenterology;  Laterality: N/A;   ESOPHAGOGASTRODUODENOSCOPY (EGD) WITH PROPOFOL  N/A 06/06/2017   Procedure: ESOPHAGOGASTRODUODENOSCOPY (EGD) WITH PROPOFOL ;   Surgeon: Viktoria Lamar DASEN, MD;  Location: [facility] ENDOSCOPY;  Service: Endoscopy;  Laterality: N/A;   ESOPHAGOGASTRODUODENOSCOPY (EGD) WITH PROPOFOL  N/A 11/27/2019   Procedure: ESOPHAGOGASTRODUODENOSCOPY (EGD) WITH PROPOFOL ;  Surgeon: Toledo, Ladell POUR, MD;  Location: [facility] ENDOSCOPY;  Service: Gastroenterology;  Laterality: N/A;   ESOPHAGOGASTRODUODENOSCOPY (EGD) WITH PROPOFOL  N/A 01/16/2023   Procedure: ESOPHAGOGASTRODUODENOSCOPY (EGD) WITH PROPOFOL ;  Surgeon: Maryruth Ole DASEN, MD;  Location: [facility] ENDOSCOPY;  Service: Endoscopy;  Laterality: N/A;   ESOPHAGOGASTRODUODENOSCOPY (EGD) WITH PROPOFOL  N/A 06/27/2023   Procedure: ESOPHAGOGASTRODUODENOSCOPY (EGD) WITH PROPOFOL ;  Surgeon: Federico Rosario BROCKS, MD;  Location: [facility] ENDOSCOPY;  Service: Gastroenterology;  Laterality: N/A;   GIVENS CAPSULE STUDY N/A 06/24/2023   Procedure: GIVENS CAPSULE STUDY;  Surgeon: San Sandor GAILS, DO;  Location: [facility] ENDOSCOPY;  Service: Gastroenterology;  Laterality: N/A;   GIVENS CAPSULE STUDY N/A 06/27/2023   Procedure: GIVENS CAPSULE STUDY;  Surgeon: Federico Rosario BROCKS, MD;  Location: [facility] ENDOSCOPY;  Service: Gastroenterology;  Laterality: N/A;   HEMORRHOID SURGERY     HOT HEMOSTASIS N/A 06/22/2023   Procedure: HOT HEMOSTASIS (ARGON PLASMA COAGULATION/BICAP);  Surgeon: San Sandor GAILS, DO;  Location: [facility] ENDOSCOPY;  Service: Gastroenterology;  Laterality: N/A;   IR ANGIOGRAM VISCERAL SELECTIVE  06/21/2023   SUBMUCOSAL TATTOO INJECTION  06/22/2023   Procedure: SUBMUCOSAL TATTOO INJECTION;  Surgeon: San Sandor GAILS, DO;  Location: [facility] ENDOSCOPY;  Service: Gastroenterology;;   TONSILLECTOMY      Home Medications:  Allergies as of 07/04/2024       Reactions   Amoxicillin Other (See Comments)   Oral thrush   Other Swelling   Pneumonia vaccine-swollen/painful/fever chills   Amlodipine Other (See Comments)   EDEMA        Medication List        Accurate as of July 04, 2024 11:31 AM. If you have any questions,  ask your nurse or doctor.          STOP taking these medications    spironolactone 100 MG tablet Commonly known as: ALDACTONE Stopped by: Keimari Summerdale C Rui Wordell       TAKE these medications    ascorbic acid  500 MG tablet Commonly known as: VITAMIN C  Take 500 mg by mouth at bedtime.   atorvastatin  20 MG tablet Commonly known as: LIPITOR Take 20 mg by mouth at bedtime.   Cholecalciferol  25 MCG (1000 UT) capsule Take 2,000 Units by mouth at bedtime.   cyanocobalamin  1000 MCG tablet Commonly known as: VITAMIN B12 Take 1,000 mcg by mouth at bedtime.   ferrous sulfate  325 (65 FE) MG tablet Take 1 tablet (325 mg total) by mouth daily with breakfast.   folic acid  1 MG tablet Commonly known as: FOLVITE  Take 2 mg by mouth in the morning.   gabapentin  300 MG capsule Commonly known as: NEURONTIN  Take 300 mg by mouth in the morning and at bedtime.   metoprolol  succinate 100 MG 24 hr tablet Commonly known as: TOPROL -XL Take 100 mg by mouth daily.   metoprolol  tartrate 25 MG tablet Commonly known as: LOPRESSOR  Take 0.5 tablets (12.5 mg total) by mouth 2 (  two) times daily.   Octreotide  Acetate 100 MCG/ML Sosy INJECT 1 ML (100 MCG TOTAL) INTO THE SKIN EVERY 12 HOURS   omega-3 acid ethyl esters 1 g capsule Commonly known as: LOVAZA  Take 1 g by mouth in the morning and at bedtime.   pantoprazole  40 MG injection Commonly known as: PROTONIX  Inject 40 mg into the vein every 12 (twelve) hours.   polyethylene glycol 17 g packet Commonly known as: MIRALAX  / GLYCOLAX  Take 17 g by mouth daily.   tamsulosin  0.4 MG Caps capsule Commonly known as: FLOMAX  Take 1 capsule (0.4 mg total) by mouth daily.   triamcinolone ointment 0.1 % Commonly known as: KENALOG Apply 1 Application topically 2 (two) times daily as needed (leg irritation/rash.).        Allergies:  Allergies  Allergen Reactions   Amoxicillin Other (See Comments)    Oral thrush   Other Swelling    Pneumonia  vaccine-swollen/painful/fever chills   Amlodipine Other (See Comments)    EDEMA    Family History: Family History  Problem Relation Age of Onset   Heart disease Mother    Heart Problems Father     Social History:  reports that he has never smoked. He has never been exposed to tobacco smoke. He has never used smokeless tobacco. He reports current alcohol use of about 7.0 standard drinks of alcohol per week. He reports that he does not use drugs.   Physical Exam: BP (!) 187/92   Pulse (!) 56   Ht 5' 8 (1.727 m)   Wt 182 lb (82.6 kg)   BMI 27.67 kg/m   Constitutional:  Alert, No acute distress. HEENT: [place] AT Respiratory: Normal respiratory effort, no increased work of breathing. Psychiatric: Normal mood and affect.  Laboratory Data:  Urinalysis Pending   Assessment & Plan:    1.  History of urethral stricture.  Has noted some slowing of his urinary stream since his initial postop visit PVR today was initially listed at 193 however patient states that he had not voided prior to this being performed. He subsequently voided and repeat PVR was 6 mL. Will continue annual follow-up and he was instructed to call earlier for worsening of his urinary stream or voiding difficulty  2.  Right flank pain Urinalysis pending Prior history of stone disease and will order a renal ultrasound.  He will be notified with results   Glendia JAYSON Barba, MD  [facility] [address], [address] [place], [place] [address] [phone]

## 2024-07-06 ENCOUNTER — Encounter: Payer: Self-pay | Admitting: Urology

## 2024-07-07 LAB — URINALYSIS, COMPLETE
Bilirubin, UA: NEGATIVE
Glucose, UA: NEGATIVE
Ketones, UA: NEGATIVE
Leukocytes,UA: NEGATIVE
Nitrite, UA: NEGATIVE
RBC, UA: NEGATIVE
Specific Gravity, UA: 1.025 (ref 1.005–1.030)
Urobilinogen, Ur: 0.2 mg/dL (ref 0.2–1.0)
pH, UA: 6 (ref 5.0–7.5)

## 2024-07-07 LAB — MICROSCOPIC EXAMINATION
Bacteria, UA: NONE SEEN
Epithelial Cells (non renal): >10 /HPF — AB (ref 0–10)

## 2024-07-10 ENCOUNTER — Other Ambulatory Visit: Payer: Self-pay | Admitting: *Deleted

## 2024-07-10 MED ORDER — TAMSULOSIN HCL 0.4 MG PO CAPS: 0.4000 mg | ORAL_CAPSULE | Freq: Every day | ORAL | 6 refills | Status: AC

## 2024-07-14 ENCOUNTER — Encounter: Payer: Self-pay | Admitting: Oncology

## 2024-07-15 ENCOUNTER — Ambulatory Visit: Admission: RE | Admit: 2024-07-15 | Discharge: 2024-07-15 | Attending: Urology | Admitting: Urology

## 2024-07-15 DIAGNOSIS — Z87442 Personal history of urinary calculi: Secondary | ICD-10-CM | POA: Insufficient documentation

## 2024-07-15 DIAGNOSIS — R10A1 Flank pain, right side: Secondary | ICD-10-CM | POA: Diagnosis present

## 2024-07-17 ENCOUNTER — Ambulatory Visit: Payer: Self-pay | Admitting: Urology

## 2024-08-14 ENCOUNTER — Telehealth: Payer: Self-pay | Admitting: Gastroenterology

## 2024-08-14 NOTE — Telephone Encounter (Signed)
 Called & spoke with patient's pharmacy, and patient's medication is ready. Pt's wife made aware.

## 2024-08-14 NOTE — Telephone Encounter (Signed)
 PT is calling to advise that his octreotide  is on back order and he doesn't know what to do. Requesting a call back.

## 2024-08-20 NOTE — Telephone Encounter (Signed)
 Patient's wife called stating patient has not been able to pic up prescription. Requesting a call back to discuss further. Please advise, thank you

## 2024-08-21 ENCOUNTER — Other Ambulatory Visit: Payer: Self-pay

## 2024-08-21 NOTE — Telephone Encounter (Signed)
 Called & spoke with patient's wife and they were able to get a short supply from the pharmacy, but the supply they were given will only last another 10 days. She says pharmacy informed them the manufacturer may not have medication available again until at least mid-February. She called [facility] & [facility], both suggested she contact a speciality pharmacy since they are having the same issue. Advised wife I would call [facility] & be back in touch.

## 2024-08-21 NOTE — Telephone Encounter (Signed)
 Called [facility] & was transferred to speciality pharmacy and waited on hold for 15 minutes with no response. Will try again to reach pharmacy at a later time.

## 2024-08-21 NOTE — Telephone Encounter (Addendum)
 Attempted to call speciality pharmacy, but still unable to get through. Will continue efforts.

## 2024-08-22 NOTE — Telephone Encounter (Signed)
 I will reach out to someone and see what I can find out

## 2024-08-28 NOTE — Telephone Encounter (Signed)
 Attempted to reach patient again.  No answer.

## 2024-08-28 NOTE — Telephone Encounter (Signed)
Pt's spouse returned call.

## 2024-08-28 NOTE — Telephone Encounter (Signed)
Left message for patient to call back.   Mychart message sent as well.

## 2024-08-29 NOTE — Telephone Encounter (Signed)
 Patient is aware, responded via mychart.

## 2024-10-06 ENCOUNTER — Ambulatory Visit: Admitting: Oncology

## 2024-10-06 ENCOUNTER — Other Ambulatory Visit

## 2024-10-14 ENCOUNTER — Other Ambulatory Visit

## 2024-10-14 ENCOUNTER — Ambulatory Visit: Admitting: Oncology
